# Patient Record
Sex: Male | Born: 1950 | Race: Black or African American | Hispanic: No | Marital: Married | Smoking: Former smoker
Health system: Southern US, Community
[De-identification: ages and names within clinical notes are randomized; demographics above are authoritative.]

## PROBLEM LIST (undated history)

## (undated) DIAGNOSIS — I10 Essential (primary) hypertension: Secondary | ICD-10-CM

## (undated) DIAGNOSIS — L83 Acanthosis nigricans: Secondary | ICD-10-CM

## (undated) DIAGNOSIS — I517 Cardiomegaly: Secondary | ICD-10-CM

## (undated) DIAGNOSIS — M25569 Pain in unspecified knee: Secondary | ICD-10-CM

## (undated) DIAGNOSIS — I219 Acute myocardial infarction, unspecified: Secondary | ICD-10-CM

## (undated) DIAGNOSIS — E785 Hyperlipidemia, unspecified: Secondary | ICD-10-CM

## (undated) DIAGNOSIS — Z86010 Personal history of colonic polyps: Secondary | ICD-10-CM

## (undated) DIAGNOSIS — I2510 Atherosclerotic heart disease of native coronary artery without angina pectoris: Secondary | ICD-10-CM

## (undated) DIAGNOSIS — I639 Cerebral infarction, unspecified: Secondary | ICD-10-CM

## (undated) DIAGNOSIS — E119 Type 2 diabetes mellitus without complications: Secondary | ICD-10-CM

## (undated) DIAGNOSIS — Z860101 Personal history of adenomatous and serrated colon polyps: Secondary | ICD-10-CM

## (undated) HISTORY — DX: Hyperlipidemia, unspecified: E78.5

## (undated) HISTORY — DX: Acanthosis nigricans: L83

## (undated) HISTORY — DX: Acute myocardial infarction, unspecified: I21.9

## (undated) HISTORY — DX: Type 2 diabetes mellitus without complications: E11.9

## (undated) HISTORY — DX: Personal history of adenomatous and serrated colon polyps: Z86.0101

## (undated) HISTORY — DX: Pain in unspecified knee: M25.569

## (undated) HISTORY — DX: Atherosclerotic heart disease of native coronary artery without angina pectoris: I25.10

## (undated) HISTORY — DX: Cardiomegaly: I51.7

## (undated) HISTORY — DX: Personal history of colonic polyps: Z86.010

## (undated) HISTORY — PX: CARDIAC CATHETERIZATION: SHX172

## (undated) HISTORY — PX: CYSTECTOMY: SUR359

## (undated) HISTORY — DX: Essential (primary) hypertension: I10

---
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
## 2006-09-18 DIAGNOSIS — E1129 Type 2 diabetes mellitus with other diabetic kidney complication: Secondary | ICD-10-CM | POA: Insufficient documentation

## 2006-09-18 DIAGNOSIS — I2510 Atherosclerotic heart disease of native coronary artery without angina pectoris: Secondary | ICD-10-CM | POA: Insufficient documentation

## 2006-09-18 DIAGNOSIS — I252 Old myocardial infarction: Secondary | ICD-10-CM | POA: Insufficient documentation

## 2006-09-18 DIAGNOSIS — E1121 Type 2 diabetes mellitus with diabetic nephropathy: Secondary | ICD-10-CM | POA: Insufficient documentation

## 2006-10-18 DIAGNOSIS — I517 Cardiomegaly: Secondary | ICD-10-CM | POA: Insufficient documentation

## 2007-01-24 ENCOUNTER — Ambulatory Visit: Payer: Self-pay | Admitting: Family Medicine

## 2007-02-08 DIAGNOSIS — Z87891 Personal history of nicotine dependence: Secondary | ICD-10-CM | POA: Insufficient documentation

## 2007-03-13 DIAGNOSIS — E7800 Pure hypercholesterolemia, unspecified: Secondary | ICD-10-CM | POA: Insufficient documentation

## 2007-03-13 DIAGNOSIS — I10 Essential (primary) hypertension: Secondary | ICD-10-CM | POA: Insufficient documentation

## 2007-03-14 DIAGNOSIS — N529 Male erectile dysfunction, unspecified: Secondary | ICD-10-CM | POA: Insufficient documentation

## 2009-06-13 DIAGNOSIS — R946 Abnormal results of thyroid function studies: Secondary | ICD-10-CM | POA: Insufficient documentation

## 2011-01-12 DIAGNOSIS — E782 Mixed hyperlipidemia: Secondary | ICD-10-CM | POA: Insufficient documentation

## 2013-03-29 ENCOUNTER — Ambulatory Visit: Payer: Self-pay | Admitting: Unknown Physician Specialty

## 2013-03-29 LAB — HM COLONOSCOPY

## 2013-03-30 LAB — PATHOLOGY REPORT

## 2013-05-04 LAB — PSA: PSA: 0.7

## 2013-05-04 LAB — HEPATIC FUNCTION PANEL
ALT: 19 U/L (ref 10–40)
AST: 17 U/L (ref 14–40)

## 2013-11-02 LAB — HEMOGLOBIN A1C: Hgb A1c MFr Bld: 6.7 % — AB (ref 4.0–6.0)

## 2013-11-05 LAB — LIPID PANEL
Cholesterol: 118 mg/dL (ref 0–200)
HDL: 29 mg/dL — AB (ref 35–70)
LDL Cholesterol: 67 mg/dL
Triglycerides: 111 mg/dL (ref 40–160)

## 2013-11-05 LAB — BASIC METABOLIC PANEL
BUN: 22 mg/dL — AB (ref 4–21)
Creatinine: 1.3 mg/dL (ref 0.6–1.3)
Glucose: 235 mg/dL
Potassium: 4.8 mmol/L (ref 3.4–5.3)
Sodium: 137 mmol/L (ref 137–147)

## 2014-03-28 LAB — HEMOGLOBIN A1C: Hgb A1c MFr Bld: 7.2 % — AB (ref 4.0–6.0)

## 2014-05-03 DIAGNOSIS — E780 Pure hypercholesterolemia: Secondary | ICD-10-CM | POA: Diagnosis not present

## 2014-05-03 DIAGNOSIS — I10 Essential (primary) hypertension: Secondary | ICD-10-CM | POA: Diagnosis not present

## 2014-05-03 DIAGNOSIS — I2510 Atherosclerotic heart disease of native coronary artery without angina pectoris: Secondary | ICD-10-CM | POA: Diagnosis not present

## 2014-06-17 DIAGNOSIS — L309 Dermatitis, unspecified: Secondary | ICD-10-CM | POA: Diagnosis not present

## 2014-09-30 ENCOUNTER — Other Ambulatory Visit: Payer: Self-pay | Admitting: Family Medicine

## 2014-09-30 NOTE — Telephone Encounter (Signed)
Patient is requesting a refill on his diabetic medicine. [facility] mail order. Cb# [phone]

## 2014-10-03 MED ORDER — SITAGLIPTIN PHOS-METFORMIN HCL 50-1000 MG PO TABS: 1.0000 | ORAL_TABLET | Freq: Two times a day (BID) | ORAL | Status: DC

## 2014-10-03 NOTE — Telephone Encounter (Signed)
Refill request for Janumet 50-1000 mg Last filled by MD on- 01/01/2014 #180 x3 Last Appt: 03/28/2014 Next Appt: none Please advise refill?

## 2014-11-27 DIAGNOSIS — Z87891 Personal history of nicotine dependence: Secondary | ICD-10-CM | POA: Diagnosis not present

## 2014-11-27 DIAGNOSIS — I10 Essential (primary) hypertension: Secondary | ICD-10-CM | POA: Diagnosis not present

## 2014-11-27 DIAGNOSIS — E782 Mixed hyperlipidemia: Secondary | ICD-10-CM | POA: Diagnosis not present

## 2014-11-27 DIAGNOSIS — I2510 Atherosclerotic heart disease of native coronary artery without angina pectoris: Secondary | ICD-10-CM | POA: Diagnosis not present

## 2014-12-05 ENCOUNTER — Other Ambulatory Visit: Payer: Self-pay | Admitting: Family Medicine

## 2014-12-31 ENCOUNTER — Other Ambulatory Visit: Payer: Self-pay | Admitting: Family Medicine

## 2015-01-06 DIAGNOSIS — Z87891 Personal history of nicotine dependence: Secondary | ICD-10-CM | POA: Diagnosis not present

## 2015-01-28 ENCOUNTER — Other Ambulatory Visit: Payer: Self-pay | Admitting: *Deleted

## 2015-01-28 ENCOUNTER — Telehealth: Payer: Self-pay

## 2015-01-28 MED ORDER — METOPROLOL SUCCINATE ER 200 MG PO TB24: 200.0000 mg | ORAL_TABLET | Freq: Every day | ORAL | Status: DC

## 2015-01-28 MED ORDER — GLIPIZIDE ER 10 MG PO TB24
10.0000 mg | ORAL_TABLET | Freq: Every day | ORAL | Status: DC
Start: 2015-01-28 — End: 2015-02-19

## 2015-01-28 MED ORDER — LISINOPRIL 40 MG PO TABS: 40.0000 mg | ORAL_TABLET | Freq: Every day | ORAL | Status: DC

## 2015-01-28 NOTE — Telephone Encounter (Signed)
Advised pt as directed. Pt verbalized fully understanding. Scheduled pt for a OV appointment on 02/07/2015 at 01:45 pm.  Thanks,

## 2015-01-28 NOTE — Telephone Encounter (Signed)
-----   Message from [name], MD sent at 01/28/2015  4:14 PM EDT ----- Regarding: needs office visit.  Please advise patient we sent refill to his mail order but he is due for office visit which he needs to schedule before additional refills can be approved.

## 2015-02-06 ENCOUNTER — Encounter: Payer: Self-pay | Admitting: *Deleted

## 2015-02-06 DIAGNOSIS — Z86010 Personal history of colonic polyps: Secondary | ICD-10-CM | POA: Insufficient documentation

## 2015-02-06 DIAGNOSIS — M25569 Pain in unspecified knee: Secondary | ICD-10-CM | POA: Insufficient documentation

## 2015-02-06 DIAGNOSIS — L83 Acanthosis nigricans: Secondary | ICD-10-CM | POA: Insufficient documentation

## 2015-02-06 DIAGNOSIS — M79673 Pain in unspecified foot: Secondary | ICD-10-CM | POA: Insufficient documentation

## 2015-02-06 DIAGNOSIS — Z860101 Personal history of adenomatous and serrated colon polyps: Secondary | ICD-10-CM | POA: Insufficient documentation

## 2015-02-07 ENCOUNTER — Encounter: Payer: Self-pay | Admitting: Family Medicine

## 2015-02-07 ENCOUNTER — Ambulatory Visit (INDEPENDENT_AMBULATORY_CARE_PROVIDER_SITE_OTHER): Payer: Medicare Other | Admitting: Family Medicine

## 2015-02-07 VITALS — BP 138/74 | HR 68 | Temp 98.2°F | Resp 16 | Ht 66.5 in | Wt 270.0 lb

## 2015-02-07 DIAGNOSIS — Z23 Encounter for immunization: Secondary | ICD-10-CM

## 2015-02-07 DIAGNOSIS — I10 Essential (primary) hypertension: Secondary | ICD-10-CM

## 2015-02-07 DIAGNOSIS — E119 Type 2 diabetes mellitus without complications: Secondary | ICD-10-CM | POA: Diagnosis not present

## 2015-02-07 DIAGNOSIS — E7800 Pure hypercholesterolemia, unspecified: Secondary | ICD-10-CM | POA: Diagnosis not present

## 2015-02-07 DIAGNOSIS — I2510 Atherosclerotic heart disease of native coronary artery without angina pectoris: Secondary | ICD-10-CM

## 2015-02-07 DIAGNOSIS — R946 Abnormal results of thyroid function studies: Secondary | ICD-10-CM | POA: Diagnosis not present

## 2015-02-07 LAB — POCT GLYCOSYLATED HEMOGLOBIN (HGB A1C)
Est. average glucose Bld gHb Est-mCnc: 197
Hemoglobin A1C: 8.5

## 2015-02-07 LAB — POCT UA - MICROALBUMIN: Microalbumin Ur, POC: 20 mg/L

## 2015-02-07 NOTE — Progress Notes (Signed)
Patient: Todd Potter Male    DOB: [birth year]   64 y.o.   MRN: [id number] Visit Date: 02/07/2015  Today's Provider: [name], MD   Chief Complaint  Patient presents with  . Hypertension    follow up  . Diabetes    follow up  . Hyperlipidemia    follow up   Subjective:    HPI  Diabetes Mellitus Type II, Follow-up:   Lab Results  Component Value Date   HGBA1C 8.5 02/07/2015   HGBA1C 7.2* 03/28/2014   HGBA1C 6.7* 11/02/2013   Last seen for diabetes 1 years ago.  Management since then includes advising patient to get strict with his diet. He reports good compliance with treatment. He is not having side effects.  Current symptoms include none and have been stable. Home blood sugar records: not being checked   Episodes of hypoglycemia? no   Current Insulin Regimen: none Most Recent Eye Exam:  2 years ago  Weight trend: increasing Prior visit with dietician: no Current diet: in general, an "unhealthy" diet Current exercise: none  ------------------------------------------------------------------------   Hypertension, follow-up:  BP Readings from Last 3 Encounters:  02/07/15 138/74  03/28/14 148/76    He was last seen for hypertension 1 years ago.  BP at that visit was 148/76. Management since that visit includes advising patient to get strict with his diet and decrease sodium.Marland KitchenHe reports good compliance with treatment. He is not having side effects.  He is not exercising. He is adherent to low salt diet.   Outside blood pressures are 027-253 systolic over 66'Y diastolic. He is experiencing none.  Patient denies chest pain, chest pressure/discomfort, claudication, dyspnea, exertional chest pressure/discomfort, fatigue, irregular heart beat, lower extremity edema, near-syncope, orthopnea, palpitations, paroxysmal nocturnal dyspnea, syncope and tachypnea.   Cardiovascular risk factors include advanced age (older than 73 for men, 57 for women),  diabetes mellitus, hypertension, male gender and sedentary lifestyle.  Use of agents associated with hypertension: NSAIDS.   ------------------------------------------------------------------------    Lipid/Cholesterol, Follow-up:   Last seen for this 1 years ago.  Management since that visit includes no changes.  Last Lipid Panel:    Component Value Date/Time   CHOL 118 11/05/2013   TRIG 111 11/05/2013   HDL 29* 11/05/2013   LDLCALC 67 11/05/2013    He reports fair compliance with treatment. Current treatment includes  Atorvastatin 80mg  daily. Patient believes he was supposed to have been on 40mg  daily and has only been taking 1/2 tablet of the Atorvastatin 80mg .  He is not having side effects.   Wt Readings from Last 3 Encounters:  02/07/15 270 lb (122.471 kg)  03/28/14 263 lb (119.296 kg)    ------------------------------------------------------------------------      Allergies no known allergies Previous Medications   AMLODIPINE (NORVASC) 10 MG TABLET    Take 1 tablet by mouth daily.   ASPIRIN 81 MG TABLET    Take 1 tablet by mouth daily.   ATORVASTATIN (LIPITOR) 80 MG TABLET    Take 0.5 tablets by mouth daily.    GLIPIZIDE (GLUCOTROL XL) 10 MG 24 HR TABLET    Take 1 tablet (10 mg total) by mouth daily.   GLUCOSE BLOOD (TRUETEST TEST) TEST STRIP    TRUETEST TEST (In Vitro Strip)  Check blood sugar daily for 0 days  Quantity: 100.00;  Refills: 12   Ordered :01-Mar-2011  [name] MD;  Started 25-September-2007 Active Comments: fishdona (09/25/2007) - With 100 lancets   HYDROCHLOROTHIAZIDE (HYDRODIURIL)  25 MG TABLET    TAKE 1 TABLET BY MOUTH EVERY DAY   LISINOPRIL (PRINIVIL,ZESTRIL) 40 MG TABLET    Take 1 tablet (40 mg total) by mouth daily.   METOPROLOL (TOPROL-XL) 200 MG 24 HR TABLET    Take 1 tablet (200 mg total) by mouth daily.   NITROGLYCERIN (NITROSTAT) 0.4 MG SL TABLET    Place 1 tablet under the tongue. 3-5 minutes x3 as nedded   SILDENAFIL (VIAGRA) 100 MG  TABLET    Take by mouth. 1/2-1 tablet as needed, no more than 1 a day   SITAGLIPTIN-METFORMIN (JANUMET) 50-1000 MG PER TABLET    Take 1 tablet by mouth 2 (two) times daily with a meal.    Review of Systems  Constitutional: Negative for fever, chills and appetite change.  Respiratory: Negative for chest tightness, shortness of breath and wheezing.   Cardiovascular: Negative for chest pain and palpitations.  Gastrointestinal: Negative for nausea, vomiting and abdominal pain.  Endocrine: Negative for cold intolerance, heat intolerance, polydipsia, polyphagia and polyuria.    Social History  Substance Use Topics  . Smoking status: Former Smoker    Quit date: 04/19/2006  . Smokeless tobacco: Not on file  . Alcohol Use: 0.0 oz/week    0 Standard drinks or equivalent per week     Comment: drinks 1 beer monthly   Objective:   BP 138/74 mmHg  Pulse 68  Temp(Src) 98.2 F (36.8 C) (Oral)  Resp 16  Ht 5' 6.5" (1.689 m)  Wt 270 lb (122.471 kg)  BMI 42.93 kg/m2  SpO2 96%  Physical Exam  General Appearance:    Alert, cooperative, no distress, obese  Eyes:    PERRL, conjunctiva/corneas clear, EOM's intact       Lungs:     Clear to auscultation bilaterally, respirations unlabored  Heart:    Regular rate and rhythm  Neurologic:   Awake, alert, oriented x 3. No apparent focal neurological           defect.       Results for orders placed or performed in visit on 02/07/15  POCT HgB A1C  Result Value Ref Range   Hemoglobin A1C 8.5    Est. average glucose Bld gHb Est-mCnc 197   POCT UA - Microalbumin  Result Value Ref Range   Microalbumin Ur, POC 20 mg/L   Creatinine, POC n/a mg/dL   Albumin/Creatinine Ratio, Urine, POC n/a        Assessment & Plan:     1. Controlled type 2 diabetes mellitus without complication, without long-term current use of insulin (HCC) Worsening. He has not been following diet well and is gaining weight. He is to get on sctrict diabetic diet and increase  exercise to lose weight. Advised we will need to add additional medications if not improved at follow up.  - POCT HgB A1C - POCT UA - Microalbumin  2. Abnormal results on thyroid function studies  - TSH  3. Coronary artery disease involving native coronary artery of native heart without angina pectoris Asymptomatic. Compliant with medication.  Continue aggressive risk factor modification.  Continue routine follow up with  Cardiologist.   4. Essential hypertension Stable BP. Continue current medications.   - Renal function panel  5. Pure hypercholesterolemia Is currently only taking 1/2 of an 80mg  atorvastatin. Will see how lipids look to see how effective this dose is - Lipid panel  6. Morbid obesity, unspecified obesity type ([place]) D&E  7. Need for pneumococcal vaccination -[id number]  administered today.   Return in about 3 months (around 05/10/2015).       [name], MD  [facility]

## 2015-02-10 ENCOUNTER — Other Ambulatory Visit: Payer: Self-pay | Admitting: Family Medicine

## 2015-02-10 DIAGNOSIS — I10 Essential (primary) hypertension: Secondary | ICD-10-CM | POA: Diagnosis not present

## 2015-02-10 DIAGNOSIS — R946 Abnormal results of thyroid function studies: Secondary | ICD-10-CM | POA: Diagnosis not present

## 2015-02-10 DIAGNOSIS — E7800 Pure hypercholesterolemia, unspecified: Secondary | ICD-10-CM | POA: Diagnosis not present

## 2015-02-11 ENCOUNTER — Other Ambulatory Visit: Payer: Self-pay | Admitting: Family Medicine

## 2015-02-11 LAB — LIPID PANEL
Chol/HDL Ratio: 4.7 ratio units (ref 0.0–5.0)
Cholesterol, Total: 149 mg/dL (ref 100–199)
HDL: 32 mg/dL — ABNORMAL LOW (ref >39)
LDL Calculated: 99 mg/dL (ref 0–99)
Triglycerides: 89 mg/dL (ref 0–149)
VLDL Cholesterol Cal: 18 mg/dL (ref 5–40)

## 2015-02-11 LAB — RENAL FUNCTION PANEL
Albumin: 4 g/dL (ref 3.6–4.8)
BUN/Creatinine Ratio: 15 (ref 10–22)
BUN: 20 mg/dL (ref 8–27)
CO2: 23 mmol/L (ref 18–29)
Calcium: 9.6 mg/dL (ref 8.6–10.2)
Chloride: 95 mmol/L — ABNORMAL LOW (ref 97–106)
Creatinine, Ser: 1.31 mg/dL — ABNORMAL HIGH (ref 0.76–1.27)
GFR calc Af Amer: 66 mL/min/{1.73_m2} (ref >59)
GFR calc non Af Amer: 57 mL/min/{1.73_m2} — ABNORMAL LOW (ref >59)
Glucose: 195 mg/dL — ABNORMAL HIGH (ref 65–99)
Phosphorus: 3.6 mg/dL (ref 2.5–4.5)
Potassium: 4.4 mmol/L (ref 3.5–5.2)
Sodium: 139 mmol/L (ref 136–144)

## 2015-02-11 LAB — TSH: TSH: 0.467 u[IU]/mL (ref 0.450–4.500)

## 2015-02-11 MED ORDER — ATORVASTATIN CALCIUM 80 MG PO TABS: 80.0000 mg | ORAL_TABLET | Freq: Every day | ORAL | Status: DC

## 2015-02-13 ENCOUNTER — Telehealth: Payer: Self-pay | Admitting: Family Medicine

## 2015-02-13 NOTE — Telephone Encounter (Signed)
Pt returning call.  CB#[phone]/MW

## 2015-02-13 NOTE — Telephone Encounter (Signed)
Tried calling patient to advised of lab results. No answer.

## 2015-02-19 ENCOUNTER — Other Ambulatory Visit: Payer: Self-pay | Admitting: *Deleted

## 2015-02-19 MED ORDER — METOPROLOL SUCCINATE ER 200 MG PO TB24: 200.0000 mg | ORAL_TABLET | Freq: Every day | ORAL | Status: DC

## 2015-02-19 MED ORDER — SITAGLIPTIN PHOS-METFORMIN HCL 50-1000 MG PO TABS: 1.0000 | ORAL_TABLET | Freq: Two times a day (BID) | ORAL | Status: DC

## 2015-02-19 MED ORDER — GLIPIZIDE ER 10 MG PO TB24: 10.0000 mg | ORAL_TABLET | Freq: Every day | ORAL | Status: DC

## 2015-02-19 MED ORDER — AMLODIPINE BESYLATE 10 MG PO TABS: 10.0000 mg | ORAL_TABLET | Freq: Every day | ORAL | Status: DC

## 2015-02-19 MED ORDER — LISINOPRIL 40 MG PO TABS: 40.0000 mg | ORAL_TABLET | Freq: Every day | ORAL | Status: DC

## 2015-02-19 MED ORDER — ATORVASTATIN CALCIUM 80 MG PO TABS: 80.0000 mg | ORAL_TABLET | Freq: Every day | ORAL | Status: DC

## 2015-02-19 NOTE — Telephone Encounter (Signed)
Patient was notified of results. Patient expressed understanding. 

## 2015-03-13 ENCOUNTER — Other Ambulatory Visit: Payer: Self-pay | Admitting: Family Medicine

## 2015-03-18 ENCOUNTER — Other Ambulatory Visit: Payer: Self-pay | Admitting: Family Medicine

## 2015-05-16 DIAGNOSIS — E782 Mixed hyperlipidemia: Secondary | ICD-10-CM | POA: Diagnosis not present

## 2015-05-16 DIAGNOSIS — I2510 Atherosclerotic heart disease of native coronary artery without angina pectoris: Secondary | ICD-10-CM | POA: Diagnosis not present

## 2015-05-16 DIAGNOSIS — I10 Essential (primary) hypertension: Secondary | ICD-10-CM | POA: Diagnosis not present

## 2015-06-26 ENCOUNTER — Encounter: Payer: Self-pay | Admitting: Family Medicine

## 2015-06-26 ENCOUNTER — Ambulatory Visit (INDEPENDENT_AMBULATORY_CARE_PROVIDER_SITE_OTHER): Payer: Medicare Other | Admitting: Family Medicine

## 2015-06-26 VITALS — BP 130/80 | HR 69 | Temp 98.7°F | Resp 16 | Ht 66.5 in | Wt 267.0 lb

## 2015-06-26 DIAGNOSIS — E1121 Type 2 diabetes mellitus with diabetic nephropathy: Secondary | ICD-10-CM

## 2015-06-26 DIAGNOSIS — E1129 Type 2 diabetes mellitus with other diabetic kidney complication: Secondary | ICD-10-CM

## 2015-06-26 DIAGNOSIS — E7800 Pure hypercholesterolemia, unspecified: Secondary | ICD-10-CM

## 2015-06-26 DIAGNOSIS — E1165 Type 2 diabetes mellitus with hyperglycemia: Secondary | ICD-10-CM | POA: Diagnosis not present

## 2015-06-26 DIAGNOSIS — Z125 Encounter for screening for malignant neoplasm of prostate: Secondary | ICD-10-CM | POA: Diagnosis not present

## 2015-06-26 DIAGNOSIS — I10 Essential (primary) hypertension: Secondary | ICD-10-CM | POA: Diagnosis not present

## 2015-06-26 DIAGNOSIS — I2510 Atherosclerotic heart disease of native coronary artery without angina pectoris: Secondary | ICD-10-CM

## 2015-06-26 DIAGNOSIS — Z86010 Personal history of colonic polyps: Secondary | ICD-10-CM | POA: Diagnosis not present

## 2015-06-26 DIAGNOSIS — Z0000 Encounter for general adult medical examination without abnormal findings: Secondary | ICD-10-CM | POA: Diagnosis not present

## 2015-06-26 DIAGNOSIS — IMO0002 Reserved for concepts with insufficient information to code with codable children: Secondary | ICD-10-CM

## 2015-06-26 LAB — POCT GLYCOSYLATED HEMOGLOBIN (HGB A1C)
Est. average glucose Bld gHb Est-mCnc: 209
Hemoglobin A1C: 8.9

## 2015-06-26 NOTE — Progress Notes (Signed)
Patient: Todd Potter, Male    DOB: Nov 10, 1950, 65 y.o.   MRN: [id number] Visit Date: 06/26/2015  Today's Provider: [name], MD   Chief Complaint  Patient presents with  . Medicare Wellness  . Hypertension  . Diabetes  . Hyperlipidemia   Subjective:    Annual wellness visit Todd Potter is a 65 y.o. male. He feels well. He reports exercising none. He reports he is sleeping well.  -----------------------------------------------------------  Follow-up for CAD from 02/07/2015; followed by cardiology.    Diabetes Mellitus Type II, Follow-up:   Lab Results  Component Value Date   HGBA1C 8.5 02/07/2015   HGBA1C 7.2* 03/28/2014   HGBA1C 6.7* 11/02/2013   Last seen for diabetes 5 months ago.  Management since then includes; advised to get back on strict diet and increase exercise to lose weight. He reports good compliance with treatment. He is not having side effects. none Current symptoms include none and have been stable. Home blood sugar records: fasting range: n/a  Episodes of hypoglycemia? no   Current Insulin Regimen: n/a Most Recent Eye Exam: due Weight trend: stable Prior visit with dietician: no Current diet: in general, an "unhealthy" diet Current exercise: none  ----------------------------------------------------------------------   Hypertension, follow-up:  BP Readings from Last 3 Encounters:  06/26/15 130/80  02/07/15 138/74  03/28/14 148/76    He was last seen for hypertension 5 months ago.  BP at that visit was 138/74. Management since that visit includes; no changes.He reports good compliance with treatment. He is not having side effects. nnoe  He is not exercising. He is not adherent to low salt diet.   Outside blood pressures are 130/80. He is experiencing none.  Patient denies none.   Cardiovascular risk factors include diabetes mellitus.  Use of agents associated with hypertension: none.    ----------------------------------------------------------------------    Lipid/Cholesterol, Follow-up:   Last seen for this 5 months ago.  Management since that visit includes; increased atorvastatin to 80 mg qd.  Last Lipid Panel:    Component Value Date/Time   CHOL 149 02/10/2015 0856   CHOL 118 11/05/2013   TRIG 89 02/10/2015 0856   HDL 32* 02/10/2015 0856   HDL 29* 11/05/2013   CHOLHDL 4.7 02/10/2015 0856   LDLCALC 99 02/10/2015 0856   LDLCALC 67 11/05/2013    He reports good compliance with treatment. He is not having side effects. none  Wt Readings from Last 3 Encounters:  06/26/15 267 lb (121.11 kg)  02/07/15 270 lb (122.471 kg)  03/28/14 263 lb (119.296 kg)    ----------------------------------------------------------------------        Review of Systems  Constitutional: Negative.   HENT: Negative.   Eyes: Negative.   Respiratory: Negative.   Cardiovascular: Negative.   Gastrointestinal: Negative.   Endocrine: Negative.   Genitourinary: Positive for frequency.  Musculoskeletal: Negative.   Skin: Positive for rash.  Allergic/Immunologic: Negative.   Neurological: Negative.   Hematological: Negative.   Psychiatric/Behavioral: Negative.     Social History   Social History  . Marital Status: Married    Spouse Name: N/A  . Number of Children: 2  . Years of Education: HS Grad   Occupational History  . Part-Time     [employer]   Social History Main Topics  . Smoking status: Former Smoker    Quit date: 04/19/2006  . Smokeless tobacco: Not on file  . Alcohol Use: 0.0 oz/week    0 Standard drinks or equivalent per  week     Comment: drinks 1 beer monthly  . Drug Use: No  . Sexual Activity: Not on file   Other Topics Concern  . Not on file   Social History Narrative    Past Medical History  Diagnosis Date  . Diabetes mellitus without complication ([place])   . Hyperlipidemia   . Hypertension   . CAD (coronary artery  disease)   . Myocardial infarct ([place])   . Cardiomegaly   . Acanthosis nigricans   . Knee pain   . History of adenomatous polyp of colon      Patient Active Problem List   Diagnosis Date Noted  . Morbid obesity ([place]) 02/07/2015  . Knee pain 02/06/2015  . History of adenomatous polyp of colon 02/06/2015  . Acanthosis nigricans 02/06/2015  . Abnormal results on thyroid function studies 06/13/2009  . 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 Pure hypercholesterolemia 03/13/2007  . Essential hypertension 03/13/2007  . Personal history of tobacco use 02/08/2007  . Cardiac enlargement 10/18/2006  . Myocardial infarct, old 09/18/2006  . Controlled diabetes mellitus with renal manifestations ([place]) 09/18/2006  . CAD (coronary artery disease) 09/18/2006    Past Surgical History  Procedure Laterality Date  . Cystectomy  1970&1990    x2 on tail bone    His family history is not on file.    Previous Medications   AMLODIPINE (NORVASC) 10 MG TABLET    Take 1 tablet (10 mg total) by mouth daily.   ASPIRIN 81 MG TABLET    Take 1 tablet by mouth daily.   ATORVASTATIN (LIPITOR) 80 MG TABLET    TAKE 1 TABLET DAILY   GLIPIZIDE (GLUCOTROL XL) 10 MG 24 HR TABLET    Take 1 tablet (10 mg total) by mouth daily.   GLUCOSE BLOOD (TRUETEST TEST) TEST STRIP    TRUETEST TEST (In Vitro Strip)  Check blood sugar daily for 0 days  Quantity: 100.00;  Refills: 12   Ordered :01-Mar-2011  [name] MD;  Started 25-September-2007 Active Comments: fishdona (09/25/2007) - With 100 lancets   HYDROCHLOROTHIAZIDE (HYDRODIURIL) 25 MG TABLET    Take 1 tablet (25 mg total) by mouth daily.   JANUMET 50-1000 MG TABLET    TAKE 1 TABLET BY MOUTH TWICE A DAY   LISINOPRIL (PRINIVIL,ZESTRIL) 40 MG TABLET    Take 1 tablet (40 mg total) by mouth daily.   LOSARTAN (COZAAR) 50 MG TABLET    Take 50 mg by mouth daily.   METOPROLOL (TOPROL-XL) 200 MG 24 HR TABLET    Take 1 tablet (200 mg total) by mouth daily.   NITROGLYCERIN  (NITROSTAT) 0.4 MG SL TABLET    Place 1 tablet under the tongue. 3-5 minutes x3 as nedded   SILDENAFIL (VIAGRA) 100 MG TABLET    Take by mouth. 1/2-1 tablet as needed, no more than 1 a day    Patient Care Team: [name], MD as PCP - General (Family Medicine) Shirley Friar, MD (Cardiology)     Objective:   Vitals: BP 130/80 mmHg  Pulse 69  Temp(Src) 98.7 F (37.1 C) (Oral)  Resp 16  Ht 5' 6.5" (1.689 m)  Wt 267 lb (121.11 kg)  BMI 42.45 kg/m2  SpO2 95%  Physical Exam  General Appearance:    Alert, cooperative, no distress, obese  Eyes:    PERRL, conjunctiva/corneas clear, EOM's intact       Lungs:     Clear to auscultation bilaterally, respirations unlabored  Heart:  Regular rate and rhythm  Neurologic:   Awake, alert, oriented x 3. No apparent focal neurological           defect.        Activities of Daily Living In your present state of health, do you have any difficulty performing the following activities: 06/26/2015  Hearing? N  Vision? N  Difficulty concentrating or making decisions? N  Walking or climbing stairs? N  Dressing or bathing? N  Doing errands, shopping? N    Fall Risk Assessment Fall Risk  06/26/2015 02/07/2015  Falls in the past year? No No     Depression Screen PHQ 2/9 Scores 06/26/2015 02/07/2015  PHQ - 2 Score 0 0  PHQ- 9 Score - 0    Cognitive Testing - 6-CIT  Correct? Score   What year is it? yes 0 0 or 4  What month is it? yes 0 0 or 3  Memorize:    Pia Mau,  41,  [place],      What time is it? (within 1 hour) yes 0 0 or 3  Count backwards from 20 yes 0 0, 2, or 4  Name the months of the year yes 0 0, 2, or 4  Repeat name & address above yes 6 0, 2, 4, 6, 8, or 10       TOTAL SCORE  6/28   Interpretation:  Normal  Normal (0-7) Abnormal (8-28)    Audit-C Alcohol Use Screening  Question Answer Points  How often do you have alcoholic drink? 1 times monthly 1  On days you do drink alcohol, how many drinks do  you typically consume? 1 or 2 0  How oftey will you drink 6 or more in a total? never 0  Total Score:  1   A score of 3 or more in women, and 4 or more in men indicates increased risk for alcohol abuse, EXCEPT if all of the points are from question 1.     Assessment & Plan:     Annual Wellness Visit  Reviewed patient's Family Medical History Reviewed and updated list of patient's medical providers Assessment of cognitive impairment was done Assessed patient's functional ability Established a written schedule for health screening [place] Completed and Reviewed  Exercise Activities and Dietary recommendations Goals    None      Immunization History  Administered Date(s) Administered  . Pneumococcal Conjugate-13 02/07/2015  . Pneumococcal Polysaccharide-23 04/19/2010  . Td 10/25/1997  . Tdap 03/10/2012    Health Maintenance  Topic Date Due  . FOOT EXAM  05/08/1959  . OPHTHALMOLOGY EXAM  05/08/1959  . ZOSTAVAX  05/07/2009  . INFLUENZA VACCINE  07/19/2015 (Originally 11/18/2014)  . HEMOGLOBIN A1C  08/08/2015  . PNA vac Low Risk Adult (2 of 2 - PPSV23) 02/07/2016  . TETANUS/TDAP  03/10/2022  . COLONOSCOPY  03/30/2023  . Hepatitis C Screening  Completed      Discussed health benefits of physical activity, and encouraged him to engage in regular exercise appropriate for his age and condition.    --------------------------------------------------------------------------------  1. Medicare annual wellness visit, subsequent   2. Uncontrolled type 2 diabetes mellitus with diabetic nephropathy, without long-term current use of insulin ([place]) Counseled to start checking fastings every day and lose weight through diet and exercising 150 minutes per week.   Return in 2 months, advised we will need add another medication if not improved at follow up.  - POCT glycosylated hemoglobin (  Hb A1C) - EKG 12-Lead  3. Coronary artery disease involving native  coronary artery of native heart without angina pectoris Asymptomatic. Compliant with medication.  Continue aggressive risk factor modification.  Continue routine follow up with Dr. Charlane Ferretti  4 Essential hypertension Improved since cardiologist added losartan. Consider discontinuation if we add another diabetic agent such as an SGLt-2 Inhibitor - EKG 12-Lead - Renal function panel  5. History of adenomatous polyp of colon Is up to date on colonoscopies  6. Morbid obesity, unspecified obesity type ([place]) Diet and exercise  7. Pure hypercholesterolemia Tolerating increased dose of atorvastatin.  - Hepatic function panel - Lipid panel  8. 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

## 2015-06-27 LAB — HEPATIC FUNCTION PANEL
ALT: 24 IU/L (ref 0–44)
AST: 15 IU/L (ref 0–40)
Alkaline Phosphatase: 97 IU/L (ref 39–117)
Bilirubin Total: 0.6 mg/dL (ref 0.0–1.2)
Bilirubin, Direct: 0.13 mg/dL (ref 0.00–0.40)
Total Protein: 7.9 g/dL (ref 6.0–8.5)

## 2015-06-27 LAB — RENAL FUNCTION PANEL
Albumin: 4.1 g/dL (ref 3.6–4.8)
BUN/Creatinine Ratio: 16 (ref 10–22)
BUN: 21 mg/dL (ref 8–27)
CO2: 24 mmol/L (ref 18–29)
Calcium: 9.2 mg/dL (ref 8.6–10.2)
Chloride: 96 mmol/L (ref 96–106)
Creatinine, Ser: 1.29 mg/dL — ABNORMAL HIGH (ref 0.76–1.27)
GFR calc Af Amer: 66 mL/min/{1.73_m2} (ref >59)
GFR calc non Af Amer: 57 mL/min/{1.73_m2} — ABNORMAL LOW (ref >59)
Glucose: 183 mg/dL — ABNORMAL HIGH (ref 65–99)
Phosphorus: 3.1 mg/dL (ref 2.5–4.5)
Potassium: 4.4 mmol/L (ref 3.5–5.2)
Sodium: 135 mmol/L (ref 134–144)

## 2015-06-27 LAB — LIPID PANEL
Chol/HDL Ratio: 4.2 ratio units (ref 0.0–5.0)
Cholesterol, Total: 131 mg/dL (ref 100–199)
HDL: 31 mg/dL — ABNORMAL LOW (ref >39)
LDL Calculated: 84 mg/dL (ref 0–99)
Triglycerides: 81 mg/dL (ref 0–149)
VLDL Cholesterol Cal: 16 mg/dL (ref 5–40)

## 2015-06-27 LAB — PSA: Prostate Specific Ag, Serum: 0.8 ng/mL (ref 0.0–4.0)

## 2015-08-28 ENCOUNTER — Ambulatory Visit (INDEPENDENT_AMBULATORY_CARE_PROVIDER_SITE_OTHER): Payer: Medicare Other | Admitting: Family Medicine

## 2015-08-28 ENCOUNTER — Encounter: Payer: Self-pay | Admitting: Family Medicine

## 2015-08-28 VITALS — BP 140/72 | HR 65 | Temp 97.9°F | Resp 16 | Wt 266.0 lb

## 2015-08-28 DIAGNOSIS — IMO0001 Reserved for inherently not codable concepts without codable children: Secondary | ICD-10-CM

## 2015-08-28 DIAGNOSIS — E1165 Type 2 diabetes mellitus with hyperglycemia: Secondary | ICD-10-CM | POA: Diagnosis not present

## 2015-08-28 DIAGNOSIS — I2510 Atherosclerotic heart disease of native coronary artery without angina pectoris: Secondary | ICD-10-CM

## 2015-08-28 LAB — POCT GLYCOSYLATED HEMOGLOBIN (HGB A1C)
Est. average glucose Bld gHb Est-mCnc: 203
Hemoglobin A1C: 8.7

## 2015-08-28 MED ORDER — EMPAGLIFLOZIN 10 MG PO TABS: 10.0000 mg | ORAL_TABLET | Freq: Every day | ORAL | Status: DC

## 2015-08-28 NOTE — Patient Instructions (Signed)
   Start taking samples of Jardiance 10mg  tablet once a day   You need to have labs checked in 4 weeks to see how Todd Potter is working with your kidneys.

## 2015-08-28 NOTE — Progress Notes (Signed)
Patient: Todd Potter Male    DOB: 06/23/1950   65 y.o.   MRN: [id number] Visit Date: 08/28/2015  Today's Provider: [name], MD   Chief Complaint  Patient presents with  . Diabetes    follow up   Subjective:    HPI  Diabetes Mellitus Type II, Follow-up:   Lab Results  Component Value Date   HGBA1C 8.9 06/26/2015   HGBA1C 8.5 02/07/2015   HGBA1C 7.2* 03/28/2014    Last seen for diabetes 2 months ago.  Management since then includes advising patient to start checking fasting sugars every day, improving diet and increasing exercise to 150 minutes per week. He reports poor compliance with treatment. Patient has been taking his medications but has not been watching his diet, checking his fasting sugars or exercising regularly.  He is not having side effects.  Current symptoms include none and have been stable. Home blood sugar records: not being checked  Episodes of hypoglycemia? no   Current Insulin Regimen: none Most Recent Eye Exam:  07/2015 Weight trend: stable Prior visit with dietician: no Current diet: in general, an "unhealthy" diet Current exercise: none  Pertinent Labs:    Component Value Date/Time   CHOL 131 06/26/2015 1033   CHOL 118 11/05/2013   TRIG 81 06/26/2015 1033   HDL 31* 06/26/2015 1033   HDL 29* 11/05/2013   LDLCALC 84 06/26/2015 1033   LDLCALC 67 11/05/2013   CREATININE 1.29* 06/26/2015 1033   CREATININE 1.3 11/05/2013    Wt Readings from Last 3 Encounters:  08/28/15 266 lb (120.657 kg)  06/26/15 267 lb (121.11 kg)  02/07/15 270 lb (122.471 kg)    ------------------------------------------------------------------------      No Known Allergies Previous Medications   AMLODIPINE (NORVASC) 10 MG TABLET    Take 1 tablet (10 mg total) by mouth daily.   ASPIRIN 81 MG TABLET    Take 1 tablet by mouth daily.   ATORVASTATIN (LIPITOR) 80 MG TABLET    TAKE 1 TABLET DAILY   GLIPIZIDE (GLUCOTROL XL) 10 MG 24 HR TABLET    Take 1  tablet (10 mg total) by mouth daily.   GLUCOSE BLOOD (TRUETEST TEST) TEST STRIP    TRUETEST TEST (In Vitro Strip)  Check blood sugar daily for 0 days  Quantity: 100.00;  Refills: 12   Ordered :01-Mar-2011  [name] MD;  Started 25-September-2007 Active Comments: fishdona (09/25/2007) - With 100 lancets   HYDROCHLOROTHIAZIDE (HYDRODIURIL) 25 MG TABLET    Take 1 tablet (25 mg total) by mouth daily.   JANUMET 50-1000 MG TABLET    TAKE 1 TABLET BY MOUTH TWICE A DAY   LISINOPRIL (PRINIVIL,ZESTRIL) 40 MG TABLET    Take 1 tablet (40 mg total) by mouth daily.   LOSARTAN (COZAAR) 50 MG TABLET    Take 50 mg by mouth daily.   METOPROLOL (TOPROL-XL) 200 MG 24 HR TABLET    Take 1 tablet (200 mg total) by mouth daily.   NITROGLYCERIN (NITROSTAT) 0.4 MG SL TABLET    Place 1 tablet under the tongue. 3-5 minutes x3 as nedded   SILDENAFIL (VIAGRA) 100 MG TABLET    Take by mouth. 1/2-1 tablet as needed, no more than 1 a day    Review of Systems  Constitutional: Negative for fever, chills and appetite change.  Respiratory: Negative for chest tightness, shortness of breath and wheezing.   Cardiovascular: Negative for chest pain and palpitations.  Gastrointestinal: Negative for nausea, vomiting  and abdominal pain.    Social History  Substance Use Topics  . Smoking status: Former Smoker    Quit date: 04/19/2006  . Smokeless tobacco: Not on file  . Alcohol Use: 0.0 oz/week    0 Standard drinks or equivalent per week     Comment: drinks 1 beer monthly   Objective:   BP 140/72 mmHg  Pulse 65  Temp(Src) 97.9 F (36.6 C) (Oral)  Resp 16  Wt 266 lb (120.657 kg)  SpO2 96%  Physical Exam  General appearance: alert, well developed, well nourished, cooperative and in no distress Head: Normocephalic, without obvious abnormality, atraumatic Lungs: Respirations even and unlabored Extremities: No gross deformities Skin: Skin color, texture, turgor normal. No rashes seen  Psych: Appropriate mood and  affect. Neurologic: Mental status: Alert, oriented to person, place, and time, thought content appropriate.   Results for orders placed or performed in visit on 08/28/15  POCT HgB A1C  Result Value Ref Range   Hemoglobin A1C 8.7    Est. average glucose Bld gHb Est-mCnc 203         Assessment & Plan:     1. Uncontrolled type 2 diabetes mellitus without complication, without long-term current use of insulin (HCC)  - POCT HgB A1C - empagliflozin (JARDIANCE) 10 MG TABS tablet; Take 10 mg by mouth daily.  Dispense: 28 tablet; Refill: 0  Will add Jardiance 10mg . Check renal and BP in 4 weeks. Consider increase to 25mg  at that time.        [name], MD  [facility]

## 2015-09-25 ENCOUNTER — Other Ambulatory Visit: Payer: Self-pay | Admitting: Family Medicine

## 2015-09-29 ENCOUNTER — Telehealth: Payer: Self-pay | Admitting: Family Medicine

## 2015-09-29 DIAGNOSIS — IMO0001 Reserved for inherently not codable concepts without codable children: Secondary | ICD-10-CM

## 2015-09-29 DIAGNOSIS — E1165 Type 2 diabetes mellitus with hyperglycemia: Secondary | ICD-10-CM

## 2015-09-29 DIAGNOSIS — I10 Essential (primary) hypertension: Secondary | ICD-10-CM

## 2015-09-29 MED ORDER — EMPAGLIFLOZIN 10 MG PO TABS
10.0000 mg | ORAL_TABLET | Freq: Every day | ORAL | Status: DC
Start: 2015-09-29 — End: 2015-10-23

## 2015-09-29 NOTE — Telephone Encounter (Signed)
Please advise patient it is time to check Renal Panel since we started jardiance last month. Order is ready to pick up at front desk. Need to have CMA check his BP when he picks up lab order.   Thanks.

## 2015-09-29 NOTE — Telephone Encounter (Signed)
Patient was notified. Patient stated that he ran out of jardiance last week, so he hasn't taken the med since then. Patient request more samples?

## 2015-09-29 NOTE — Telephone Encounter (Signed)
Per Fisher, samples were given to pt. Patient is to wait 2 weeks after starting back on Jardiance and then get labs rechecked.

## 2015-10-22 DIAGNOSIS — I10 Essential (primary) hypertension: Secondary | ICD-10-CM | POA: Diagnosis not present

## 2015-10-23 ENCOUNTER — Telehealth: Payer: Self-pay | Admitting: *Deleted

## 2015-10-23 DIAGNOSIS — IMO0001 Reserved for inherently not codable concepts without codable children: Secondary | ICD-10-CM

## 2015-10-23 DIAGNOSIS — E1165 Type 2 diabetes mellitus with hyperglycemia: Principal | ICD-10-CM

## 2015-10-23 LAB — RENAL FUNCTION PANEL
Albumin: 3.8 g/dL (ref 3.6–4.8)
BUN/Creatinine Ratio: 19 (ref 10–24)
BUN: 32 mg/dL — ABNORMAL HIGH (ref 8–27)
CO2: 20 mmol/L (ref 18–29)
Calcium: 9.8 mg/dL (ref 8.6–10.2)
Chloride: 96 mmol/L (ref 96–106)
Creatinine, Ser: 1.72 mg/dL — ABNORMAL HIGH (ref 0.76–1.27)
GFR calc Af Amer: 47 mL/min/{1.73_m2} — ABNORMAL LOW (ref >59)
GFR calc non Af Amer: 41 mL/min/{1.73_m2} — ABNORMAL LOW (ref >59)
Glucose: 202 mg/dL — ABNORMAL HIGH (ref 65–99)
Phosphorus: 4 mg/dL (ref 2.5–4.5)
Potassium: 4.3 mmol/L (ref 3.5–5.2)
Sodium: 136 mmol/L (ref 134–144)

## 2015-10-23 MED ORDER — EMPAGLIFLOZIN 10 MG PO TABS: 10.0000 mg | ORAL_TABLET | Freq: Every day | ORAL | Status: DC

## 2015-10-23 MED ORDER — LISINOPRIL 20 MG PO TABS: 20.0000 mg | ORAL_TABLET | Freq: Every day | ORAL | Status: DC

## 2015-10-23 NOTE — Telephone Encounter (Signed)
-----   Message from [name], MD sent at 10/23/2015  8:01 AM EDT ----- Kidney functions have declined a little bit. Decrease lisinopril to 20mg  daily, #30, rf x 1. Continue Jardiance 10mg  daily. Can have 1 months samples if out. Follow up o.v. To check BP and labs in 4 weeks.

## 2015-10-23 NOTE — Telephone Encounter (Signed)
Patient was notified of results. Expressed understanding. Rx sent to pharmacy. Samples given.

## 2015-11-12 DIAGNOSIS — E782 Mixed hyperlipidemia: Secondary | ICD-10-CM | POA: Diagnosis not present

## 2015-11-12 DIAGNOSIS — I2510 Atherosclerotic heart disease of native coronary artery without angina pectoris: Secondary | ICD-10-CM | POA: Diagnosis not present

## 2015-11-12 DIAGNOSIS — I10 Essential (primary) hypertension: Secondary | ICD-10-CM | POA: Diagnosis not present

## 2015-11-21 ENCOUNTER — Telehealth: Payer: Self-pay | Admitting: Family Medicine

## 2015-11-21 DIAGNOSIS — IMO0001 Reserved for inherently not codable concepts without codable children: Secondary | ICD-10-CM

## 2015-11-21 DIAGNOSIS — E1165 Type 2 diabetes mellitus with hyperglycemia: Principal | ICD-10-CM

## 2015-11-21 NOTE — Telephone Encounter (Signed)
Please check with patient to see how he is doing with the jardiance samples. We need to see him for follow up o.v. This month and check his A1c and blood pressure. I think we've got more samples of Jardiance 10mg  in if he is out.

## 2015-11-21 NOTE — Telephone Encounter (Signed)
Needs to schedule office visit this month. Can have 4 weeks of samples of Jardiance 10mg  daily.

## 2015-11-21 NOTE — Telephone Encounter (Signed)
Patient stated that he is doing good on Jardiance. He would like to have more samples.

## 2015-11-24 MED ORDER — EMPAGLIFLOZIN 10 MG PO TABS: 10.0000 mg | ORAL_TABLET | Freq: Every day | ORAL | 0 refills | Status: DC

## 2015-11-24 NOTE — Telephone Encounter (Signed)
Samples left at front desk. Patient notified.

## 2015-12-15 ENCOUNTER — Other Ambulatory Visit: Payer: Self-pay | Admitting: Family Medicine

## 2015-12-29 ENCOUNTER — Encounter: Payer: Self-pay | Admitting: Family Medicine

## 2015-12-29 ENCOUNTER — Ambulatory Visit (INDEPENDENT_AMBULATORY_CARE_PROVIDER_SITE_OTHER): Payer: Medicare Other | Admitting: Family Medicine

## 2015-12-29 VITALS — BP 116/70 | HR 64 | Temp 97.0°F | Resp 16 | Ht 67.0 in | Wt 260.0 lb

## 2015-12-29 DIAGNOSIS — I2510 Atherosclerotic heart disease of native coronary artery without angina pectoris: Secondary | ICD-10-CM

## 2015-12-29 DIAGNOSIS — IMO0001 Reserved for inherently not codable concepts without codable children: Secondary | ICD-10-CM

## 2015-12-29 DIAGNOSIS — E1165 Type 2 diabetes mellitus with hyperglycemia: Secondary | ICD-10-CM

## 2015-12-29 DIAGNOSIS — I10 Essential (primary) hypertension: Secondary | ICD-10-CM

## 2015-12-29 LAB — POCT GLYCOSYLATED HEMOGLOBIN (HGB A1C)
Est. average glucose Bld gHb Est-mCnc: 171
Hemoglobin A1C: 7.6

## 2015-12-29 MED ORDER — EMPAGLIFLOZIN 10 MG PO TABS: 10.0000 mg | ORAL_TABLET | Freq: Every day | ORAL | 4 refills | Status: DC

## 2015-12-29 NOTE — Progress Notes (Signed)
Patient: Todd Potter Male    DOB: December 03, 1950   65 y.o.   MRN: [id number] Visit Date: 12/29/2015  Today's Provider: [name], MD   Chief Complaint  Patient presents with  . Diabetes   Subjective:    HPI  Diabetes Mellitus Type II, Follow-up:   Lab Results  Component Value Date   HGBA1C 7.6 12/29/2015   HGBA1C 8.7 08/28/2015   HGBA1C 8.9 06/26/2015    Last seen for diabetes 4 months ago.  Management since then includes added Jardiance. He reports excellent compliance with treatment. He is having side effects.   Current symptoms include none and have been stable. Home blood sugar records: not being checked  Episodes of hypoglycemia? no   Current Insulin Regimen:  Most Recent Eye Exam: [facility] Weight trend: stable Prior visit with dietician: no Current diet: in general, a "healthy" diet   Current exercise: none  Pertinent Labs:    Component Value Date/Time   CHOL 131 06/26/2015 1033   TRIG 81 06/26/2015 1033   HDL 31 (L) 06/26/2015 1033   LDLCALC 84 06/26/2015 1033   CREATININE 1.72 (H) 10/22/2015 1113    Wt Readings from Last 3 Encounters:  12/29/15 260 lb (117.9 kg)  08/28/15 266 lb (120.7 kg)  06/26/15 267 lb (121.1 kg)   BMP Latest Ref Rng & Units 10/22/2015 06/26/2015 02/10/2015  Glucose 65 - 99 mg/dL 202(H) 183(H) 195(H)  BUN 8 - 27 mg/dL 32(H) 21 20  Creatinine 0.76 - 1.27 mg/dL 1.72(H) 1.29(H) 1.31(H)  BUN/Creat Ratio 10 - '24 19 16 15  '$ Sodium 134 - 144 mmol/L 136 135 139  Potassium 3.5 - 5.2 mmol/L 4.3 4.4 4.4  Chloride 96 - 106 mmol/L 96 96 95(L)  CO2 18 - 29 mmol/L '20 24 23  '$ Calcium 8.6 - 10.2 mg/dL 9.8 9.2 9.6    ------------------------------------------------------------------------   Hypertension, follow-up:  BP Readings from Last 3 Encounters:  12/29/15 116/70  08/28/15 140/72  06/26/15 130/80    He was last seen for hypertension 4 months ago.  BP at that visit was 140/70. Management changes since that visit  include decreasing Lisinopril to 20 mg. He reports excellent compliance with treatment. He is having side effects.  He is not exercising. He is adherent to low salt diet.   Outside blood pressures are stable. He is experiencing none.  Patient denies chest pain and lower extremity edema.   Cardiovascular risk factors include advanced age (older than 67 for men, 76 for women), diabetes mellitus, hypertension and obesity (BMI >= 30 kg/m2).  Use of agents associated with hypertension: none.     Weight trend: stable Wt Readings from Last 3 Encounters:  12/29/15 260 lb (117.9 kg)  08/28/15 266 lb (120.7 kg)  06/26/15 267 lb (121.1 kg)    Current diet: in general, a "healthy" diet    ------------------------------------------------------------------------      No Known Allergies   Current Outpatient Prescriptions:  .  amLODipine (NORVASC) 10 MG tablet, Take 1 tablet (10 mg total) by mouth daily., Disp: 90 tablet, Rfl: 4 .  aspirin 81 MG tablet, Take 1 tablet by mouth daily., Disp: , Rfl:  .  atorvastatin (LIPITOR) 80 MG tablet, TAKE 1 TABLET DAILY, Disp: 90 tablet, Rfl: 4 .  empagliflozin (JARDIANCE) 10 MG TABS tablet, Take 10 mg by mouth daily., Disp: 28 tablet, Rfl: 0 .  glipiZIDE (GLUCOTROL XL) 10 MG 24 hr tablet, Take 1 tablet (10 mg total)  by mouth daily., Disp: 90 tablet, Rfl: 4 .  glucose blood (TRUETEST TEST) test strip, TRUETEST TEST (In Vitro Strip)  Check blood sugar daily for 0 days  Quantity: 100.00;  Refills: 12   Ordered :01-Mar-2011  Mila Merry MD;  Started 25-September-2007 Active Comments: fishdona (09/25/2007) - With 100 lancets, Disp: , Rfl:  .  hydrochlorothiazide (HYDRODIURIL) 25 MG tablet, Take 1 tablet (25 mg total) by mouth daily., Disp: 30 tablet, Rfl: 6 .  JANUMET 50-1000 MG tablet, TAKE 1 TABLET BY MOUTH TWICE A DAY, Disp: 60 tablet, Rfl: 12 .  lisinopril (PRINIVIL,ZESTRIL) 20 MG tablet, TAKE 1 TABLET (20 MG TOTAL) BY MOUTH DAILY., Disp: 30 tablet, Rfl: 12 .   losartan (COZAAR) 50 MG tablet, Take 50 mg by mouth daily., Disp: , Rfl:  .  metoprolol (TOPROL-XL) 200 MG 24 hr tablet, TAKE 1 TABLET DAILY, Disp: 30 tablet, Rfl: 12 .  nitroGLYCERIN (NITROSTAT) 0.4 MG SL tablet, Place 1 tablet under the tongue. 3-5 minutes x3 as nedded, Disp: , Rfl:  .  sildenafil (VIAGRA) 100 MG tablet, Take by mouth. 1/2-1 tablet as needed, no more than 1 a day, Disp: , Rfl:   Review of Systems  Constitutional: Negative.   Cardiovascular: Negative.   Endocrine: Negative.     Social History  Substance Use Topics  . Smoking status: Former Smoker    Quit date: 04/19/2006  . Smokeless tobacco: Never Used  . Alcohol use 0.0 oz/week     Comment: drinks 1 beer monthly   Objective:   BP 116/70 (BP Location: Left Arm, Patient Position: Sitting, Cuff Size: Large)   Pulse 64   Temp 97 F (36.1 C) (Oral)   Resp 16   Ht 5\' 7"  (1.702 m)   Wt 260 lb (117.9 kg)   SpO2 97%   BMI 40.72 kg/m   Physical Exam   General Appearance:    Alert, cooperative, no distress  Eyes:    PERRL, conjunctiva/corneas clear, EOM's intact       Lungs:     Clear to auscultation bilaterally, respirations unlabored  Heart:    Regular rate and rhythm  Neurologic:   Awake, alert, oriented x 3. No apparent focal neurological           defect.        Results for orders placed or performed in visit on 12/29/15  POCT glycosylated hemoglobin (Hb A1C)  Result Value Ref Range   Hemoglobin A1C 7.6    Est. average glucose Bld gHb Est-mCnc 171        Assessment & Plan:     1. Uncontrolled type 2 diabetes mellitus without complication, without long-term current use of insulin (HCC) Improved since starting Jardiance, and tolerating well, but decrease in eGFR noted. Will recheck renal panel today. If stable will send in 90 day rx to his mail order pharmacy - POCT glycosylated hemoglobin (Hb A1C)   2. Essential hypertension Improved since starting Jardiance. Will d/c losartan.  - Renal function  panel  3. Coronary artery disease involving native coronary artery of native heart without angina pectoris Asymptomatic. Compliant with medication.  Continue aggressive risk factor modification.       The entirety of the information documented in the History of Present Illness, Review of Systems and Physical Exam were personally obtained by me. Portions of this information were initially documented by 02/28/16, CMA and reviewed by me for thoroughness and accuracy.    Rondel Baton, MD  [facility]  [facility]

## 2015-12-29 NOTE — Patient Instructions (Signed)
Stop taking Cozaar (losartan)

## 2015-12-30 ENCOUNTER — Telehealth: Payer: Self-pay

## 2015-12-30 ENCOUNTER — Other Ambulatory Visit: Payer: Self-pay | Admitting: Family Medicine

## 2015-12-30 DIAGNOSIS — IMO0001 Reserved for inherently not codable concepts without codable children: Secondary | ICD-10-CM

## 2015-12-30 DIAGNOSIS — E1165 Type 2 diabetes mellitus with hyperglycemia: Principal | ICD-10-CM

## 2015-12-30 LAB — RENAL FUNCTION PANEL
Albumin: 4.2 g/dL (ref 3.6–4.8)
BUN/Creatinine Ratio: 14 (ref 10–24)
BUN: 21 mg/dL (ref 8–27)
CO2: 22 mmol/L (ref 18–29)
Calcium: 9.9 mg/dL (ref 8.6–10.2)
Chloride: 98 mmol/L (ref 96–106)
Creatinine, Ser: 1.51 mg/dL — ABNORMAL HIGH (ref 0.76–1.27)
GFR calc Af Amer: 55 mL/min/{1.73_m2} — ABNORMAL LOW (ref >59)
GFR calc non Af Amer: 47 mL/min/{1.73_m2} — ABNORMAL LOW (ref >59)
Glucose: 136 mg/dL — ABNORMAL HIGH (ref 65–99)
Phosphorus: 4.1 mg/dL (ref 2.5–4.5)
Potassium: 4.5 mmol/L (ref 3.5–5.2)
Sodium: 138 mmol/L (ref 134–144)

## 2015-12-30 MED ORDER — EMPAGLIFLOZIN 10 MG PO TABS: 10.0000 mg | ORAL_TABLET | Freq: Every day | ORAL | 4 refills | Status: DC

## 2015-12-30 NOTE — Telephone Encounter (Signed)
Tried calling; pt voice mail is not set up.   Thanks,   -Mickel Baas

## 2015-12-30 NOTE — Telephone Encounter (Signed)
-----   Message from [name], MD sent at 12/30/2015  7:51 AM EDT ----- Electrolyte are normal. Kidney functions are better. Continue current dose of jardiance. Will send prescription his mail order pharmacy. Follow up in December as scheduled.

## 2015-12-30 NOTE — Telephone Encounter (Signed)
Pt advised.   Thanks,   -Laura  

## 2016-01-08 ENCOUNTER — Telehealth: Payer: Self-pay

## 2016-01-08 NOTE — Telephone Encounter (Signed)
Can have 3 weeks samples of Jardiance 10. If we only have the 25mg  he take 1/2 of a 25mg  daily.

## 2016-01-08 NOTE — Telephone Encounter (Signed)
Patient reports that he received a letter in the mail saying that his jardiance is no longer covered. Patient thinks that this may need a prior auth. Patient is also requesting samples until medication is approved. Ok to give samples? Please advise. Thanks!

## 2016-01-09 NOTE — Telephone Encounter (Signed)
Placed 2 weeks of Jardiance up at the front desk. Advised patient.

## 2016-02-04 ENCOUNTER — Other Ambulatory Visit: Payer: Self-pay | Admitting: Family Medicine

## 2016-02-04 NOTE — Telephone Encounter (Signed)
Pt is requesting refill on Janumet 50-1000 MG sent to [facility]

## 2016-02-05 MED ORDER — SITAGLIPTIN PHOS-METFORMIN HCL 50-1000 MG PO TABS: 1.0000 | ORAL_TABLET | Freq: Two times a day (BID) | ORAL | 12 refills | Status: DC

## 2016-02-05 NOTE — Telephone Encounter (Signed)
Ok to refill? Please advise. Thanks!  

## 2016-02-20 DIAGNOSIS — M25561 Pain in right knee: Secondary | ICD-10-CM | POA: Diagnosis not present

## 2016-02-20 DIAGNOSIS — M1711 Unilateral primary osteoarthritis, right knee: Secondary | ICD-10-CM | POA: Diagnosis not present

## 2016-02-26 DIAGNOSIS — H6063 Unspecified chronic otitis externa, bilateral: Secondary | ICD-10-CM | POA: Diagnosis not present

## 2016-02-26 DIAGNOSIS — H6123 Impacted cerumen, bilateral: Secondary | ICD-10-CM | POA: Diagnosis not present

## 2016-03-01 ENCOUNTER — Other Ambulatory Visit: Payer: Self-pay | Admitting: *Deleted

## 2016-03-01 DIAGNOSIS — E1165 Type 2 diabetes mellitus with hyperglycemia: Principal | ICD-10-CM

## 2016-03-01 DIAGNOSIS — IMO0001 Reserved for inherently not codable concepts without codable children: Secondary | ICD-10-CM

## 2016-03-01 MED ORDER — SITAGLIPTIN PHOS-METFORMIN HCL 50-1000 MG PO TABS: 1.0000 | ORAL_TABLET | Freq: Two times a day (BID) | ORAL | 0 refills | Status: DC

## 2016-03-01 NOTE — Telephone Encounter (Signed)
Patient is requesting 90 day supply faxed to [facility] mail order.

## 2016-03-03 MED ORDER — GLIPIZIDE ER 10 MG PO TB24: 10.0000 mg | ORAL_TABLET | Freq: Every day | ORAL | 4 refills | Status: DC

## 2016-03-03 MED ORDER — SITAGLIPTIN PHOS-METFORMIN HCL 50-1000 MG PO TABS: 1.0000 | ORAL_TABLET | Freq: Two times a day (BID) | ORAL | 4 refills | Status: DC

## 2016-03-03 MED ORDER — HYDROCHLOROTHIAZIDE 25 MG PO TABS: 25.0000 mg | ORAL_TABLET | Freq: Every day | ORAL | 4 refills | Status: DC

## 2016-03-03 MED ORDER — METOPROLOL SUCCINATE ER 200 MG PO TB24: 200.0000 mg | ORAL_TABLET | Freq: Every day | ORAL | 4 refills | Status: DC

## 2016-03-03 MED ORDER — ATORVASTATIN CALCIUM 80 MG PO TABS: 80.0000 mg | ORAL_TABLET | Freq: Every day | ORAL | 4 refills | Status: DC

## 2016-03-03 MED ORDER — LISINOPRIL 20 MG PO TABS: 20.0000 mg | ORAL_TABLET | Freq: Every day | ORAL | 4 refills | Status: DC

## 2016-03-03 MED ORDER — EMPAGLIFLOZIN 10 MG PO TABS: 10.0000 mg | ORAL_TABLET | Freq: Every day | ORAL | 4 refills | Status: DC

## 2016-03-03 MED ORDER — AMLODIPINE BESYLATE 10 MG PO TABS: 10.0000 mg | ORAL_TABLET | Freq: Every day | ORAL | 4 refills | Status: DC

## 2016-03-03 NOTE — Telephone Encounter (Signed)
Rx's were faxed to [facility] mail order pharmacy. [phone]

## 2016-03-04 ENCOUNTER — Other Ambulatory Visit: Payer: Self-pay | Admitting: Family Medicine

## 2016-03-04 DIAGNOSIS — H6121 Impacted cerumen, right ear: Secondary | ICD-10-CM | POA: Diagnosis not present

## 2016-03-04 DIAGNOSIS — H6061 Unspecified chronic otitis externa, right ear: Secondary | ICD-10-CM | POA: Diagnosis not present

## 2016-03-27 ENCOUNTER — Other Ambulatory Visit: Payer: Self-pay | Admitting: Family Medicine

## 2016-04-01 ENCOUNTER — Encounter: Payer: Self-pay | Admitting: Family Medicine

## 2016-04-01 ENCOUNTER — Ambulatory Visit (INDEPENDENT_AMBULATORY_CARE_PROVIDER_SITE_OTHER): Payer: Medicare Other | Admitting: Family Medicine

## 2016-04-01 VITALS — BP 124/80 | HR 69 | Temp 98.1°F | Resp 16 | Ht 67.0 in | Wt 264.0 lb

## 2016-04-01 DIAGNOSIS — E0821 Diabetes mellitus due to underlying condition with diabetic nephropathy: Secondary | ICD-10-CM | POA: Diagnosis not present

## 2016-04-01 DIAGNOSIS — I2510 Atherosclerotic heart disease of native coronary artery without angina pectoris: Secondary | ICD-10-CM

## 2016-04-01 DIAGNOSIS — E119 Type 2 diabetes mellitus without complications: Secondary | ICD-10-CM

## 2016-04-01 DIAGNOSIS — E7800 Pure hypercholesterolemia, unspecified: Secondary | ICD-10-CM

## 2016-04-01 DIAGNOSIS — I10 Essential (primary) hypertension: Secondary | ICD-10-CM | POA: Diagnosis not present

## 2016-04-01 NOTE — Progress Notes (Signed)
Patient: Todd Potter Male    DOB: 1950/07/29   65 y.o.   MRN: [id number] Visit Date: 04/01/2016  Today's Provider: [name], MD   Chief Complaint  Patient presents with  . Follow-up  . Diabetes  . Hypertension  . Coronary Artery Disease   Subjective:    HPI  Coronary artery disease involving native coronary artery of native heart without angina pectoris From 12/29/2015-no changes.    Diabetes Mellitus Type II, Follow-up:   Lab Results  Component Value Date   HGBA1C 7.6 12/29/2015   HGBA1C 8.7 08/28/2015   HGBA1C 8.9 06/26/2015   Last seen for diabetes 3 months ago.  Management since then includes; no changes. He reports good compliance with treatment. He is not having side effects. none Current symptoms include none and have been unchanged. Home blood sugar records: fasting range: not checking  Episodes of hypoglycemia? no   Current Insulin Regimen: n/a Most Recent Eye Exam: 04/20/2015-normal Weight trend: stable Prior visit with dietician: no Current diet: well balanced Current exercise: none  ----------------------------------------------------------------   Hypertension, follow-up:  BP Readings from Last 3 Encounters:  12/29/15 116/70  08/28/15 140/72  06/26/15 130/80    He was last seen for hypertension 3 months ago.  BP at that visit was 116/70. Management since that visit includes; labs chacked, discontinued Losartan.He reports good compliance with treatment. He is not having side effects. none He is not exercising. He is adherent to low salt diet.   Outside blood pressures are 120/80. He is experiencing none.  Patient denies none.   Cardiovascular risk factors include diabetes mellitus.  Use of agents associated with hypertension: none.   ----------------------------------------------------------------    No Known Allergies   Current Outpatient Prescriptions:  .  amLODipine (NORVASC) 10 MG tablet, Take 1 tablet (10 mg  total) by mouth daily., Disp: 90 tablet, Rfl: 4 .  aspirin 81 MG tablet, Take 1 tablet by mouth daily., Disp: , Rfl:  .  atorvastatin (LIPITOR) 80 MG tablet, Take 1 tablet (80 mg total) by mouth daily., Disp: 90 tablet, Rfl: 4 .  empagliflozin (JARDIANCE) 10 MG TABS tablet, Take 10 mg by mouth daily., Disp: 90 tablet, Rfl: 4 .  glipiZIDE (GLUCOTROL XL) 10 MG 24 hr tablet, TAKE 1 TABLET DAILY, Disp: 90 tablet, Rfl: 3 .  glucose blood (TRUETEST TEST) test strip, TRUETEST TEST (In Vitro Strip)  Check blood sugar daily for 0 days  Quantity: 100.00;  Refills: 12   Ordered :01-Mar-2011  [name] MD;  Started 25-September-2007 Active Comments: fishdona (09/25/2007) - With 100 lancets, Disp: , Rfl:  .  hydrochlorothiazide (HYDRODIURIL) 25 MG tablet, Take 1 tablet (25 mg total) by mouth daily., Disp: 90 tablet, Rfl: 4 .  lisinopril (PRINIVIL,ZESTRIL) 20 MG tablet, Take 1 tablet (20 mg total) by mouth daily., Disp: 90 tablet, Rfl: 4 .  metoprolol (TOPROL-XL) 200 MG 24 hr tablet, Take 1 tablet (200 mg total) by mouth daily., Disp: 90 tablet, Rfl: 4 .  nitroGLYCERIN (NITROSTAT) 0.4 MG SL tablet, Place 1 tablet under the tongue. 3-5 minutes x3 as nedded, Disp: , Rfl:  .  sildenafil (VIAGRA) 100 MG tablet, Take by mouth. 1/2-1 tablet as needed, no more than 1 a day, Disp: , Rfl:  .  sitaGLIPtin-metformin (JANUMET) 50-1000 MG tablet, Take 1 tablet by mouth 2 (two) times daily., Disp: 180 tablet, Rfl: 4  Review of Systems  Constitutional: Negative for appetite change, chills and fever.  Respiratory: Negative  for chest tightness, shortness of breath and wheezing.   Cardiovascular: Negative for chest pain and palpitations.  Gastrointestinal: Negative for abdominal pain, nausea and vomiting.    Social History  Substance Use Topics  . Smoking status: Former Smoker    Quit date: 04/19/2006  . Smokeless tobacco: Never Used  . Alcohol use 0.0 oz/week     Comment: drinks 1 beer monthly   Objective:   BP 124/80  (BP Location: Left Arm, Patient Position: Sitting, Cuff Size: Large)   Pulse 69   Temp 98.1 F (36.7 C) (Oral)   Resp 16   Ht 5\' 7"  (1.702 m)   Wt 264 lb (119.7 kg)   SpO2 95%   BMI 41.35 kg/m   Physical Exam   General Appearance:    Alert, cooperative, no distress, obese  Eyes:    PERRL, conjunctiva/corneas clear, EOM's intact       Lungs:     Clear to auscultation bilaterally, respirations unlabored  Heart:    Regular rate and rhythm  Neurologic:   Awake, alert, oriented x 3. No apparent focal neurological           defect.       HgbA1c- error - high hemoglobin     Assessment & Plan:     1. Diabetes mellitus due to underlying condition, controlled, with diabetic nephropathy, without long-term current use of insulin ([name]) Doing well on current medications. He reports he has already had flu vaccine this year.  - POCT glycosylated hemoglobin (Hb A1C) - Comprehensive metabolic panel - Hemoglobin A1c  2. Essential hypertension Stable. Continue current medications.    3. Coronary artery disease involving native coronary artery of native heart without angina pectoris Asymptomatic. Compliant with medication.  Continue aggressive risk factor modification.  Follow up Dr. Charlane Ferretti as scheduled.  - Comprehensive metabolic panel - CBC - TSH  4. Pure hypercholesterolemia He is tolerating atorvastatin well with no adverse effects.   - CBC - Lipid panel - TSH  Return in about 4 months (around 07/31/2016).     The entirety of the information documented in the History of Present Illness, Review of Systems and Physical Exam were personally obtained by me. Portions of this information were initially documented by April M. Sabra Heck, CMA and reviewed by me for thoroughness and accuracy.    [name], MD  [facility]

## 2016-04-02 LAB — CBC
Hematocrit: 47.9 % (ref 37.5–51.0)
Hemoglobin: 15.1 g/dL (ref 13.0–17.7)
MCH: 28 pg (ref 26.6–33.0)
MCHC: 31.5 g/dL (ref 31.5–35.7)
MCV: 89 fL (ref 79–97)
Platelets: 188 10*3/uL (ref 150–379)
RBC: 5.39 x10E6/uL (ref 4.14–5.80)
RDW: 15.9 % — ABNORMAL HIGH (ref 12.3–15.4)
WBC: 10.1 10*3/uL (ref 3.4–10.8)

## 2016-04-02 LAB — COMPREHENSIVE METABOLIC PANEL
ALT: 21 IU/L (ref 0–44)
AST: 14 IU/L (ref 0–40)
Albumin/Globulin Ratio: 1.1 — ABNORMAL LOW (ref 1.2–2.2)
Albumin: 4.1 g/dL (ref 3.6–4.8)
Alkaline Phosphatase: 96 IU/L (ref 39–117)
BUN/Creatinine Ratio: 11 (ref 10–24)
BUN: 17 mg/dL (ref 8–27)
Bilirubin Total: 0.3 mg/dL (ref 0.0–1.2)
CO2: 22 mmol/L (ref 18–29)
Calcium: 9.2 mg/dL (ref 8.6–10.2)
Chloride: 99 mmol/L (ref 96–106)
Creatinine, Ser: 1.49 mg/dL — ABNORMAL HIGH (ref 0.76–1.27)
GFR calc Af Amer: 56 mL/min/{1.73_m2} — ABNORMAL LOW (ref >59)
GFR calc non Af Amer: 48 mL/min/{1.73_m2} — ABNORMAL LOW (ref >59)
Globulin, Total: 3.7 g/dL (ref 1.5–4.5)
Glucose: 166 mg/dL — ABNORMAL HIGH (ref 65–99)
Potassium: 4.8 mmol/L (ref 3.5–5.2)
Sodium: 140 mmol/L (ref 134–144)
Total Protein: 7.8 g/dL (ref 6.0–8.5)

## 2016-04-02 LAB — LIPID PANEL
Chol/HDL Ratio: 3.6 ratio units (ref 0.0–5.0)
Cholesterol, Total: 124 mg/dL (ref 100–199)
HDL: 34 mg/dL — ABNORMAL LOW (ref >39)
LDL Calculated: 71 mg/dL (ref 0–99)
Triglycerides: 95 mg/dL (ref 0–149)
VLDL Cholesterol Cal: 19 mg/dL (ref 5–40)

## 2016-04-02 LAB — HEMOGLOBIN A1C
Est. average glucose Bld gHb Est-mCnc: 186 mg/dL
Hgb A1c MFr Bld: 8.1 % — ABNORMAL HIGH (ref 4.8–5.6)

## 2016-04-02 LAB — TSH: TSH: 0.468 u[IU]/mL (ref 0.450–4.500)

## 2016-04-26 ENCOUNTER — Other Ambulatory Visit: Payer: Self-pay | Admitting: Family Medicine

## 2016-05-14 DIAGNOSIS — Z6841 Body Mass Index (BMI) 40.0 and over, adult: Secondary | ICD-10-CM | POA: Diagnosis not present

## 2016-05-14 DIAGNOSIS — Z86010 Personal history of colonic polyps: Secondary | ICD-10-CM | POA: Diagnosis not present

## 2016-05-15 ENCOUNTER — Other Ambulatory Visit: Payer: Self-pay | Admitting: Family Medicine

## 2016-05-20 DIAGNOSIS — I10 Essential (primary) hypertension: Secondary | ICD-10-CM | POA: Diagnosis not present

## 2016-05-20 DIAGNOSIS — Z6841 Body Mass Index (BMI) 40.0 and over, adult: Secondary | ICD-10-CM | POA: Diagnosis not present

## 2016-05-20 DIAGNOSIS — I2510 Atherosclerotic heart disease of native coronary artery without angina pectoris: Secondary | ICD-10-CM | POA: Diagnosis not present

## 2016-05-20 DIAGNOSIS — E782 Mixed hyperlipidemia: Secondary | ICD-10-CM | POA: Diagnosis not present

## 2016-07-27 ENCOUNTER — Other Ambulatory Visit: Payer: Self-pay

## 2016-07-27 DIAGNOSIS — IMO0001 Reserved for inherently not codable concepts without codable children: Secondary | ICD-10-CM

## 2016-07-27 DIAGNOSIS — E1165 Type 2 diabetes mellitus with hyperglycemia: Principal | ICD-10-CM

## 2016-07-27 MED ORDER — EMPAGLIFLOZIN 10 MG PO TABS: 10.0000 mg | ORAL_TABLET | Freq: Every day | ORAL | 0 refills | Status: DC

## 2016-07-27 NOTE — Telephone Encounter (Signed)
Patient was at the front desk requesting samples of Jardiance. OK per Dr. Caryn Section to give samples. Samples given to patient. Patient has a follow up appointment scheduled 08/05/2016 at 8:15am.

## 2016-07-30 ENCOUNTER — Telehealth: Payer: Self-pay | Admitting: Family Medicine

## 2016-07-30 NOTE — Telephone Encounter (Signed)
Called Pt to schedule AWV with [facility] - knb °

## 2016-08-05 ENCOUNTER — Encounter: Payer: Self-pay | Admitting: *Deleted

## 2016-08-05 ENCOUNTER — Ambulatory Visit (INDEPENDENT_AMBULATORY_CARE_PROVIDER_SITE_OTHER): Payer: Medicare Other | Admitting: Family Medicine

## 2016-08-05 ENCOUNTER — Encounter: Payer: Self-pay | Admitting: Family Medicine

## 2016-08-05 VITALS — BP 126/80 | HR 64 | Temp 98.0°F | Resp 16 | Ht 66.5 in | Wt 261.0 lb

## 2016-08-05 DIAGNOSIS — I2510 Atherosclerotic heart disease of native coronary artery without angina pectoris: Secondary | ICD-10-CM

## 2016-08-05 DIAGNOSIS — I10 Essential (primary) hypertension: Secondary | ICD-10-CM | POA: Diagnosis not present

## 2016-08-05 DIAGNOSIS — IMO0001 Reserved for inherently not codable concepts without codable children: Secondary | ICD-10-CM

## 2016-08-05 DIAGNOSIS — E1165 Type 2 diabetes mellitus with hyperglycemia: Secondary | ICD-10-CM | POA: Diagnosis not present

## 2016-08-05 LAB — POCT GLYCOSYLATED HEMOGLOBIN (HGB A1C)
Est. average glucose Bld gHb Est-mCnc: 197
Hemoglobin A1C: 8.5

## 2016-08-05 MED ORDER — EMPAGLIFLOZIN 10 MG PO TABS: 10.0000 mg | ORAL_TABLET | Freq: Every day | ORAL | 3 refills | Status: DC

## 2016-08-05 MED ORDER — METOPROLOL SUCCINATE ER 200 MG PO TB24: 200.0000 mg | ORAL_TABLET | Freq: Every day | ORAL | 4 refills | Status: DC

## 2016-08-05 NOTE — Patient Instructions (Signed)
You can take 1/2 of 25mg  Jaridance tablet if you run out of the 10mg  tablets.

## 2016-08-05 NOTE — Progress Notes (Signed)
Patient: Todd Potter Male    DOB: 02-Apr-1951   66 y.o.   MRN: [id number] Visit Date: 08/05/2016  Today's Provider: [name], MD   No chief complaint on file.  Subjective:    HPI  Diabetes Mellitus Type II, Follow-up:   Lab Results  Component Value Date   HGBA1C 8.5 08/05/2016   HGBA1C 8.1 (H) 04/01/2016   HGBA1C 7.6 12/29/2015    Last seen for diabetes 4 months ago.  Management since then includes avoiding sweets and starchy foods and increase exercise. He reports good compliance with treatment. However, he had been out of Jardiance for about 3 weeks and restarted last week He is not having side effects.  Current symptoms include none and have been stable. Home blood sugar records: not being checked  Episodes of hypoglycemia? no   Current Insulin Regimen: none Most Recent Eye Exam: due Weight trend: stable Prior visit with dietician: no Current diet: well balanced Current exercise: none  Pertinent Labs:    Component Value Date/Time   CHOL 124 04/01/2016 1026   TRIG 95 04/01/2016 1026   HDL 34 (L) 04/01/2016 1026   LDLCALC 71 04/01/2016 1026   CREATININE 1.49 (H) 04/01/2016 1026    Wt Readings from Last 3 Encounters:  08/05/16 261 lb (118.4 kg)  04/01/16 264 lb (119.7 kg)  12/29/15 260 lb (117.9 kg)      Hypertension, follow-up:  BP Readings from Last 3 Encounters:  08/05/16 126/80  04/01/16 124/80  12/29/15 116/70    He was last seen for hypertension 4 months ago.  BP at that visit was 124/80. Management since that visit includes no changes. He reports good compliance with treatment. He is not having side effects.  He is not exercising. He is adherent to low salt diet.   Outside blood pressures are not being checked. He is experiencing none.  Patient denies exertional chest pressure/discomfort, lower extremity edema and palpitations.   Cardiovascular risk factors include diabetes mellitus.   Weight trend: stable Wt Readings  from Last 3 Encounters:  08/05/16 261 lb (118.4 kg)  04/01/16 264 lb (119.7 kg)  12/29/15 260 lb (117.9 kg)    Current diet: well balanced         No Known Allergies   Current Outpatient Prescriptions:  .  amLODipine (NORVASC) 10 MG tablet, Take 1 tablet (10 mg total) by mouth daily., Disp: 90 tablet, Rfl: 4 .  aspirin 81 MG tablet, Take 1 tablet by mouth daily., Disp: , Rfl:  .  atorvastatin (LIPITOR) 80 MG tablet, TAKE 1 TABLET DAILY, Disp: 90 tablet, Rfl: 3 .  empagliflozin (JARDIANCE) 10 MG TABS tablet, Take 10 mg by mouth daily., Disp: 14 tablet, Rfl: 0 .  glipiZIDE (GLUCOTROL XL) 10 MG 24 hr tablet, TAKE 1 TABLET DAILY, Disp: 90 tablet, Rfl: 3 .  glucose blood (TRUETEST TEST) test strip, TRUETEST TEST (In Vitro Strip)  Check blood sugar daily for 0 days  Quantity: 100.00;  Refills: 12   Ordered :01-Mar-2011  [name] MD;  Started 25-September-2007 Active Comments: fishdona (09/25/2007) - With 100 lancets, Disp: , Rfl:  .  hydrochlorothiazide (HYDRODIURIL) 25 MG tablet, Take 1 tablet (25 mg total) by mouth daily., Disp: 90 tablet, Rfl: 4 .  JANUMET 50-1000 MG tablet, TAKE 1 TABLET TWICE DAILY  WITH MEALS, Disp: 180 tablet, Rfl: 3 .  lisinopril (PRINIVIL,ZESTRIL) 20 MG tablet, Take 1 tablet (20 mg total) by mouth daily., Disp: 90 tablet, Rfl:  4 .  metoprolol (TOPROL-XL) 200 MG 24 hr tablet, Take 1 tablet (200 mg total) by mouth daily., Disp: 90 tablet, Rfl: 4 .  nitroGLYCERIN (NITROSTAT) 0.4 MG SL tablet, Place 1 tablet under the tongue. 3-5 minutes x3 as nedded, Disp: , Rfl:  .  sildenafil (VIAGRA) 100 MG tablet, Take by mouth. 1/2-1 tablet as needed, no more than 1 a day, Disp: , Rfl:  .  sitaGLIPtin-metformin (JANUMET) 50-1000 MG tablet, Take 1 tablet by mouth 2 (two) times daily., Disp: 180 tablet, Rfl: 4  Review of Systems  Constitutional: Negative.   Respiratory: Negative.   Cardiovascular: Negative.   Endocrine: Negative.   Musculoskeletal: Negative.   Neurological:  Negative.     Social History  Substance Use Topics  . Smoking status: Former Smoker    Quit date: 04/19/2006  . Smokeless tobacco: Never Used  . Alcohol use 0.0 oz/week     Comment: drinks 1 beer monthly   Objective:   BP 126/80 (BP Location: Left Arm, Patient Position: Sitting, Cuff Size: Large)   Pulse 64   Temp 98 F (36.7 C)   Resp 16   Ht 5' 6.5" (1.689 m)   Wt 261 lb (118.4 kg)   SpO2 96%   BMI 41.50 kg/m  Vitals:   08/05/16 0831  BP: 126/80  Pulse: 64  Resp: 16  Temp: 98 F (36.7 C)  SpO2: 96%  Weight: 261 lb (118.4 kg)  Height: 5' 6.5" (1.689 m)     Physical Exam   General Appearance:    Alert, cooperative, no distress  Eyes:    PERRL, conjunctiva/corneas clear, EOM's intact       Lungs:     Clear to auscultation bilaterally, respirations unlabored  Heart:    Regular rate and rhythm  Neurologic:   Awake, alert, oriented x 3. No apparent focal neurological           defect.        Results for orders placed or performed in visit on 08/05/16  POCT glycosylated hemoglobin (Hb A1C)  Result Value Ref Range   Hemoglobin A1C 8.5    Est. average glucose Bld gHb Est-mCnc 197        Assessment & Plan:     1. Uncontrolled type 2 diabetes mellitus without complication, without long-term current use of insulin (HCC) Tolerating medications, but missed a couple weeks of Jardiance due to cost of medication. Given samples of 25mg  to take 1/2 daily. He is to take prescription to pharmacy and see if there is a lower cost alternative - POCT glycosylated hemoglobin (Hb A1C) - empagliflozin (JARDIANCE) 10 MG TABS tablet; Take 10 mg by mouth daily.  Dispense: 30 tablet; Refill: 3  2. Essential hypertension Well controlled.  Continue current medications.    3. Morbid obesity ([place]) Diet and exercise.   4. Coronary artery disease involving native coronary artery of native heart without angina pectoris Asymptomatic. Compliant with medication.  Continue aggressive risk  factor modification.         [name], MD  [facility]

## 2016-08-06 ENCOUNTER — Other Ambulatory Visit: Payer: Self-pay | Admitting: Family Medicine

## 2016-08-06 ENCOUNTER — Encounter
Admission: RE | Disposition: A | Discharge status: Home / Self Care | Payer: Self-pay | Source: Ambulatory Visit | Attending: Unknown Physician Specialty

## 2016-08-06 ENCOUNTER — Ambulatory Visit: Payer: Medicare Other | Admitting: Certified Registered Nurse Anesthetist

## 2016-08-06 ENCOUNTER — Ambulatory Visit
Admission: RE | Admit: 2016-08-06 | Discharge: 2016-08-06 | Disposition: A | Discharge status: Home / Self Care | Payer: Medicare Other | Source: Ambulatory Visit | Attending: Unknown Physician Specialty | Admitting: Unknown Physician Specialty

## 2016-08-06 ENCOUNTER — Encounter: Payer: Self-pay | Admitting: *Deleted

## 2016-08-06 DIAGNOSIS — Z7984 Long term (current) use of oral hypoglycemic drugs: Secondary | ICD-10-CM | POA: Diagnosis not present

## 2016-08-06 DIAGNOSIS — Z7982 Long term (current) use of aspirin: Secondary | ICD-10-CM | POA: Insufficient documentation

## 2016-08-06 DIAGNOSIS — E785 Hyperlipidemia, unspecified: Secondary | ICD-10-CM | POA: Insufficient documentation

## 2016-08-06 DIAGNOSIS — I10 Essential (primary) hypertension: Secondary | ICD-10-CM | POA: Diagnosis not present

## 2016-08-06 DIAGNOSIS — E119 Type 2 diabetes mellitus without complications: Secondary | ICD-10-CM | POA: Insufficient documentation

## 2016-08-06 DIAGNOSIS — K635 Polyp of colon: Secondary | ICD-10-CM | POA: Diagnosis not present

## 2016-08-06 DIAGNOSIS — Z79899 Other long term (current) drug therapy: Secondary | ICD-10-CM | POA: Insufficient documentation

## 2016-08-06 DIAGNOSIS — Z955 Presence of coronary angioplasty implant and graft: Secondary | ICD-10-CM | POA: Diagnosis not present

## 2016-08-06 DIAGNOSIS — I2510 Atherosclerotic heart disease of native coronary artery without angina pectoris: Secondary | ICD-10-CM | POA: Insufficient documentation

## 2016-08-06 DIAGNOSIS — I252 Old myocardial infarction: Secondary | ICD-10-CM | POA: Insufficient documentation

## 2016-08-06 DIAGNOSIS — Z86010 Personal history of colonic polyps: Secondary | ICD-10-CM | POA: Diagnosis not present

## 2016-08-06 DIAGNOSIS — K640 First degree hemorrhoids: Secondary | ICD-10-CM | POA: Insufficient documentation

## 2016-08-06 DIAGNOSIS — Z87891 Personal history of nicotine dependence: Secondary | ICD-10-CM | POA: Insufficient documentation

## 2016-08-06 DIAGNOSIS — Z1211 Encounter for screening for malignant neoplasm of colon: Secondary | ICD-10-CM | POA: Insufficient documentation

## 2016-08-06 DIAGNOSIS — K649 Unspecified hemorrhoids: Secondary | ICD-10-CM | POA: Diagnosis not present

## 2016-08-06 DIAGNOSIS — D123 Benign neoplasm of transverse colon: Secondary | ICD-10-CM | POA: Insufficient documentation

## 2016-08-06 HISTORY — PX: COLONOSCOPY WITH PROPOFOL: SHX5780

## 2016-08-06 LAB — GLUCOSE, CAPILLARY: Glucose-Capillary: 181 mg/dL — ABNORMAL HIGH (ref 65–99)

## 2016-08-06 SURGERY — COLONOSCOPY WITH PROPOFOL
Anesthesia: General

## 2016-08-06 MED ORDER — DAPAGLIFLOZIN PROPANEDIOL 5 MG PO TABS: 5.0000 mg | ORAL_TABLET | Freq: Every day | ORAL | 3 refills | Status: DC

## 2016-08-06 MED ORDER — MIDAZOLAM HCL 2 MG/2ML IJ SOLN
INTRAMUSCULAR | Status: AC
  Filled 2016-08-06: qty 2

## 2016-08-06 MED ORDER — PROPOFOL 500 MG/50ML IV EMUL
INTRAVENOUS | Status: AC
  Filled 2016-08-06: qty 50

## 2016-08-06 MED ORDER — PROPOFOL 10 MG/ML IV BOLUS
INTRAVENOUS | Status: DC | PRN
  Administered 2016-08-06: 30 mg via INTRAVENOUS

## 2016-08-06 MED ORDER — SODIUM CHLORIDE 0.9 % IV SOLN: INTRAVENOUS | Status: DC

## 2016-08-06 MED ORDER — FENTANYL CITRATE (PF) 100 MCG/2ML IJ SOLN: 25.0000 ug | INTRAMUSCULAR | Status: DC | PRN

## 2016-08-06 MED ORDER — LIDOCAINE HCL (CARDIAC) 20 MG/ML IV SOLN
INTRAVENOUS | Status: DC | PRN
  Administered 2016-08-06: 50 mg via INTRAVENOUS

## 2016-08-06 MED ORDER — PROPOFOL 500 MG/50ML IV EMUL
INTRAVENOUS | Status: DC | PRN
  Administered 2016-08-06: 150 ug/kg/min via INTRAVENOUS

## 2016-08-06 MED ORDER — METHYLENE BLUE 0.5 % INJ SOLN
INTRAVENOUS | Status: AC
  Filled 2016-08-06: qty 10

## 2016-08-06 MED ORDER — ONDANSETRON HCL 4 MG/2ML IJ SOLN: 4.0000 mg | Freq: Once | INTRAMUSCULAR | Status: DC | PRN

## 2016-08-06 MED ORDER — SODIUM CHLORIDE 0.9 % IV SOLN
INTRAVENOUS | Status: DC
  Administered 2016-08-06: 09:00:00 via INTRAVENOUS

## 2016-08-06 MED ORDER — MIDAZOLAM HCL 2 MG/2ML IJ SOLN
INTRAMUSCULAR | Status: DC | PRN
  Administered 2016-08-06: 2 mg via INTRAVENOUS

## 2016-08-06 NOTE — Anesthesia Preprocedure Evaluation (Signed)
Anesthesia Evaluation  Patient identified by MRN, date of birth, ID band  Reviewed: Allergy & Precautions, NPO status , Patient's Chart, lab work & pertinent test results  Airway Mallampati: II       Dental   Pulmonary former smoker,    Pulmonary exam normal        Cardiovascular hypertension, + CAD and + Past MI  Normal cardiovascular exam     Neuro/Psych negative neurological ROS  negative psych ROS   GI/Hepatic negative GI ROS, Neg liver ROS,   Endo/Other  diabetes, Well Controlled, Type 2, Oral Hypoglycemic Agents  Renal/GU   negative genitourinary   Musculoskeletal   Abdominal Normal abdominal exam  (+)   Peds negative pediatric ROS (+)  Hematology negative hematology ROS (+)   Anesthesia Other Findings   Reproductive/Obstetrics                             Anesthesia Physical Anesthesia Plan  ASA: III  Anesthesia Plan: General   Post-op Pain Management:    Induction: Intravenous  Airway Management Planned: Nasal Cannula  Additional Equipment:   Intra-op Plan:   Post-operative Plan:   Informed Consent: I have reviewed the patients History and Physical, chart, labs and discussed the procedure including the risks, benefits and alternatives for the proposed anesthesia with the patient or authorized representative who has indicated his/her understanding and acceptance.   Dental advisory given  Plan Discussed with: CRNA and Surgeon  Anesthesia Plan Comments:         Anesthesia Quick Evaluation

## 2016-08-06 NOTE — Progress Notes (Signed)
Jardiance not on formulary. Wilder Glade and Invokana are. Change to farxiga 5mg  daily.

## 2016-08-06 NOTE — Op Note (Signed)
[facility] Patient Name: Todd Potter Procedure Date: 08/06/2016 8:33 AM MRN: [id number] Account #: [id number] Date of Birth: 1950-11-17 Admit Type: Outpatient Age: 66 Room: [facility] ENDO ROOM 1 Gender: Male Note Status: Finalized Procedure:            Colonoscopy Indications:          High risk colon cancer surveillance: Personal history                        of colonic polyps Providers:            Manya Silvas, MD Referring MD:         Kirstie Peri. Caryn Section, MD (Referring MD) Medicines:            Propofol per Anesthesia Complications:        No immediate complications. Procedure:            Pre-Anesthesia Assessment:                       - After reviewing the risks and benefits, the patient                        was deemed in satisfactory condition to undergo the                        procedure.                       After obtaining informed consent, the colonoscope was                        passed under direct vision. Throughout the procedure,                        the patient's blood pressure, pulse, and oxygen                        saturations were monitored continuously. The                        Colonoscope was introduced through the anus and                        advanced to the the cecum, identified by appendiceal                        orifice and ileocecal valve. The colonoscopy was                        performed without difficulty. The patient tolerated the                        procedure well. The quality of the bowel preparation                        was adequate to identify polyps. Findings:      A diminutive polyp was found in the transverse colon. The polyp was       sessile. The polyp was removed with a jumbo cold forceps. Resection and       retrieval were complete.      Internal  hemorrhoids were found during endoscopy. The hemorrhoids were       small and Grade I (internal hemorrhoids that do not prolapse).  The exam was otherwise without abnormality. Impression:           - One diminutive polyp in the transverse colon, removed                        with a jumbo cold forceps. Resected and retrieved.                       - Internal hemorrhoids.                       - The examination was otherwise normal. Recommendation:       - Await pathology results. Manya Silvas, MD 08/06/2016 8:58:56 AM This report has been signed electronically. Number of Addenda: 0 Note Initiated On: 08/06/2016 8:33 AM Scope Withdrawal Time: 0 hours 10 minutes 55 seconds  Total Procedure Duration: 0 hours 14 minutes 34 seconds       [facility]

## 2016-08-06 NOTE — Anesthesia Post-op Follow-up Note (Cosign Needed)
Anesthesia QCDR form completed.        

## 2016-08-06 NOTE — H&P (Signed)
Primary Care Physician:  [name], MD Primary Gastroenterologist:  Dr. Vira Agar  Pre-Procedure History & Physical: HPI:  Todd Potter is a 66 y.o. male is here for an colonoscopy.   Past Medical History:  Diagnosis Date  . Acanthosis nigricans   . CAD (coronary artery disease)   . Cardiomegaly   . Diabetes mellitus without complication ([place])   . History of adenomatous polyp of colon   . Hyperlipidemia   . Hypertension   . Knee pain   . Myocardial infarct [facility])     Past Surgical History:  Procedure Laterality Date  . CARDIAC CATHETERIZATION     Stent Placement x 5 to RCA   . CYSTECTOMY  1970&1990   x2 on tail bone    Prior to Admission medications   Medication Sig Start Date End Date Taking? Authorizing Provider  amLODipine (NORVASC) 10 MG tablet Take 1 tablet (10 mg total) by mouth daily. 03/03/16  Yes [name], MD  aspirin 325 MG tablet Take 325 mg by mouth daily. Pt on 3-year test for 325mg  ASA.   Yes Historical Provider, MD  atorvastatin (LIPITOR) 80 MG tablet TAKE 1 TABLET DAILY 05/15/16  Yes [name], MD  empagliflozin (JARDIANCE) 10 MG TABS tablet Take 10 mg by mouth daily. 08/05/16  Yes [name], MD  glipiZIDE (GLUCOTROL XL) 10 MG 24 hr tablet TAKE 1 TABLET DAILY 03/27/16  Yes [name], MD  hydrochlorothiazide (HYDRODIURIL) 25 MG tablet Take 1 tablet (25 mg total) by mouth daily. 03/03/16  Yes [name], MD  JANUMET 50-1000 MG tablet TAKE 1 TABLET TWICE DAILY  WITH MEALS 04/26/16  Yes [name], MD  metoprolol (TOPROL-XL) 200 MG 24 hr tablet Take 1 tablet (200 mg total) by mouth daily. 08/05/16  Yes [name], MD  sitaGLIPtin-metformin (JANUMET) 50-1000 MG tablet Take 1 tablet by mouth 2 (two) times daily. 03/03/16  Yes [name], MD  aspirin 81 MG tablet Take 1 tablet by mouth daily. 11/18/06   Historical Provider, MD  glucose blood (TRUETEST TEST) test strip TRUETEST TEST (In Vitro Strip)  Check blood sugar daily  for 0 days  Quantity: 100.00;  Refills: 12   Ordered :01-Mar-2011  [name] MD;  Started 25-September-2007 Active Comments: fishdona (09/25/2007) - With 100 lancets 09/25/07   Historical Provider, MD  lisinopril (PRINIVIL,ZESTRIL) 20 MG tablet Take 1 tablet (20 mg total) by mouth daily. Patient not taking: Reported on 08/06/2016 03/03/16   [name], MD  nitroGLYCERIN (NITROSTAT) 0.4 MG SL tablet Place 1 tablet under the tongue. 3-5 minutes x3 as nedded 11/14/09   Historical Provider, MD  sildenafil (VIAGRA) 100 MG tablet Take by mouth. 1/2-1 tablet as needed, no more than 1 a day 12/22/12   Historical Provider, MD    Allergies as of 06/11/2016  . (No Known Allergies)    History reviewed. No pertinent family history.  Social History   Social History  . Marital status: Married    Spouse name: N/A  . Number of children: 2  . Years of education: HS Grad   Occupational History  . Part-Time     Land   Social History Main Topics  . Smoking status: Former Smoker    Quit date: 04/19/2006  . Smokeless tobacco: Never Used  . Alcohol use 0.0 oz/week     Comment: drinks 1 beer monthly  . Drug use: No  . Sexual activity: Not on file  Other Topics Concern  . Not on file   Social History Narrative  . No narrative on file    Review of Systems: See HPI, otherwise negative ROS  Physical Exam: BP (!) 149/77   Pulse 73   Temp (!) 96.1 F (35.6 C) (Tympanic)   SpO2 98%  General:   Alert,  pleasant and cooperative in NAD Head:  Normocephalic and atraumatic. Neck:  Supple; no masses or thyromegaly. Lungs:  Clear throughout to auscultation.    Heart:  Regular rate and rhythm. Abdomen:  Soft, nontender and nondistended. Normal bowel sounds, without guarding, and without rebound.   Neurologic:  Alert and  oriented x4;  grossly normal neurologically.  Impression/Plan: Todd Potter is here for an colonoscopy to be performed for [facility] colon polyps  Risks,  benefits, limitations, and alternatives regarding  colonoscopy have been reviewed with the patient.  Questions have been answered.  All parties agreeable.   Gaylyn Cheers, MD  08/06/2016, 8:32 AM

## 2016-08-06 NOTE — Anesthesia Postprocedure Evaluation (Signed)
Anesthesia Post Note  Patient: Todd Potter  Procedure(s) Performed: Procedure(s) (LRB): COLONOSCOPY WITH PROPOFOL (N/A)  Patient location during evaluation: PACU Anesthesia Type: General Level of consciousness: awake and alert and oriented Pain management: pain level controlled Vital Signs Assessment: post-procedure vital signs reviewed and stable Respiratory status: spontaneous breathing Cardiovascular status: blood pressure returned to baseline Anesthetic complications: no     Last Vitals:  Vitals:   08/06/16 0929 08/06/16 0932  BP: 115/72 110/70  Pulse: 64 (!) 59  Resp: 17 20  Temp:      Last Pain:  Vitals:   08/06/16 0901  TempSrc: Oral                 Jonna Dittrich

## 2016-08-06 NOTE — Anesthesia Procedure Notes (Signed)
Date/Time: 08/06/2016 8:35 AM Performed by: Johnna Acosta Pre-anesthesia Checklist: Patient identified, Emergency Drugs available, Suction available, Patient being monitored and Timeout performed Patient Re-evaluated:Patient Re-evaluated prior to induction

## 2016-08-06 NOTE — Transfer of Care (Signed)
Immediate Anesthesia Transfer of Care Note  Patient: Todd Potter  Procedure(s) Performed: Procedure(s): COLONOSCOPY WITH PROPOFOL (N/A)  Patient Location: PACU  Anesthesia Type:General  Level of Consciousness: awake and alert   Airway & Oxygen Therapy: Patient Spontanous Breathing and Patient connected to nasal cannula oxygen  Post-op Assessment: Report given to RN and Post -op Vital signs reviewed and stable  Post vital signs: Reviewed and stable  Last Vitals:  Vitals:   08/06/16 0900 08/06/16 0901  BP:  (!) 110/53  Pulse: 66 66  Resp: (!) 9 12  Temp:  (!) 35.9 C    Last Pain:  Vitals:   08/06/16 0901  TempSrc: Oral         Complications: No apparent anesthesia complications

## 2016-08-09 ENCOUNTER — Encounter: Payer: Self-pay | Admitting: Unknown Physician Specialty

## 2016-08-09 LAB — SURGICAL PATHOLOGY

## 2016-08-12 LAB — HM DIABETES EYE EXAM

## 2016-10-12 ENCOUNTER — Other Ambulatory Visit: Payer: Self-pay

## 2016-10-14 ENCOUNTER — Ambulatory Visit (INDEPENDENT_AMBULATORY_CARE_PROVIDER_SITE_OTHER): Payer: Medicare Other | Admitting: Family Medicine

## 2016-10-14 ENCOUNTER — Encounter: Payer: Self-pay | Admitting: Family Medicine

## 2016-10-14 VITALS — BP 120/70 | HR 62 | Temp 97.5°F | Resp 16 | Wt 261.0 lb

## 2016-10-14 DIAGNOSIS — I2510 Atherosclerotic heart disease of native coronary artery without angina pectoris: Secondary | ICD-10-CM

## 2016-10-14 DIAGNOSIS — R040 Epistaxis: Secondary | ICD-10-CM

## 2016-10-14 NOTE — Progress Notes (Signed)
Patient: Todd Potter Male    DOB: 1951-02-21   66 y.o.   MRN: [id number] Visit Date: 10/14/2016  Today's Provider: Vernie Murders, PA   Chief Complaint  Patient presents with  . Epistaxis   Subjective:    Patient has been having nose bleeds for 2 months, several times a week. Bleeding occurs on right side with moderate stream that lasts about 3 minutes. Patient uses tissue until bleeding stops. No upper respiratory symptoms.     Epistaxis   The bleeding has been from the right nare. This is a new problem. The current episode started more than 1 month ago (2 months). The problem occurs every several days. The problem has been unchanged. The bleeding is associated with nothing. Treatments tried: tissue. The treatment provided significant relief.   Patient Active Problem List   Diagnosis Date Noted  . Morbid obesity ([place]) 02/07/2015  . Knee pain 02/06/2015  . History of adenomatous polyp of colon 02/06/2015  . Acanthosis nigricans 02/06/2015  . Hyperlipidemia, mixed 01/12/2011  . 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 Pure hypercholesterolemia 03/13/2007  . Essential hypertension 03/13/2007  . Personal history of tobacco use 02/08/2007  . Cardiac enlargement 10/18/2006  . Myocardial infarct, old 09/18/2006  . Controlled diabetes mellitus with renal manifestations ([place]) 09/18/2006  . CAD (coronary artery disease) 09/18/2006   Past Surgical History:  Procedure Laterality Date  . CARDIAC CATHETERIZATION     Stent Placement x 5 to RCA   . COLONOSCOPY WITH PROPOFOL N/A 08/06/2016   Procedure: COLONOSCOPY WITH PROPOFOL;  Surgeon: Manya Silvas, MD;  [facility]COPY;  Service: Endoscopy;  Laterality: N/A;  . CYSTECTOMY  1970&1990   x2 on tail bone   History reviewed. No pertinent family history.   No Known Allergies   Current Outpatient Prescriptions:  .  amLODipine (NORVASC) 10 MG tablet, Take 1 tablet (10 mg total) by mouth daily., Disp: 90 tablet,  Rfl: 4 .  aspirin 325 MG tablet, Take 325 mg by mouth daily. Pt on 3-year test for 325mg  ASA., Disp: , Rfl:  .  aspirin 81 MG tablet, Take 1 tablet by mouth daily., Disp: , Rfl:  .  atorvastatin (LIPITOR) 80 MG tablet, TAKE 1 TABLET DAILY, Disp: 90 tablet, Rfl: 3 .  dapagliflozin propanediol (FARXIGA) 5 MG TABS tablet, Take 5 mg by mouth daily., Disp: 30 tablet, Rfl: 3 .  empagliflozin (JARDIANCE) 10 MG TABS tablet, Take 10 mg by mouth daily., Disp: 30 tablet, Rfl: 3 .  glipiZIDE (GLUCOTROL XL) 10 MG 24 hr tablet, TAKE 1 TABLET DAILY, Disp: 90 tablet, Rfl: 3 .  glucose blood (TRUETEST TEST) test strip, TRUETEST TEST (In Vitro Strip)  Check blood sugar daily for 0 days  Quantity: 100.00;  Refills: 12   Ordered :01-Mar-2011  [name] MD;  Started 25-September-2007 Active Comments: fishdona (09/25/2007) - With 100 lancets, Disp: , Rfl:  .  hydrochlorothiazide (HYDRODIURIL) 25 MG tablet, Take 1 tablet (25 mg total) by mouth daily., Disp: 90 tablet, Rfl: 4 .  JANUMET 50-1000 MG tablet, TAKE 1 TABLET TWICE DAILY  WITH MEALS, Disp: 180 tablet, Rfl: 3 .  lisinopril (PRINIVIL,ZESTRIL) 20 MG tablet, Take 1 tablet (20 mg total) by mouth daily., Disp: 90 tablet, Rfl: 4 .  losartan (COZAAR) 50 MG tablet, TAKE 1 TABLET ONCE DAILY, Disp: , Rfl:  .  metoprolol (TOPROL-XL) 200 MG 24 hr tablet, Take 1 tablet (200 mg total) by mouth daily., Disp: 90  tablet, Rfl: 4 .  nitroGLYCERIN (NITROSTAT) 0.4 MG SL tablet, Place 1 tablet under the tongue. 3-5 minutes x3 as nedded, Disp: , Rfl:  .  sildenafil (VIAGRA) 100 MG tablet, Take by mouth. 1/2-1 tablet as needed, no more than 1 a day, Disp: , Rfl:   Review of Systems  Constitutional: Negative for appetite change, chills and fever.  HENT: Positive for nosebleeds.   Respiratory: Negative for chest tightness, shortness of breath and wheezing.   Cardiovascular: Negative for chest pain and palpitations.  Gastrointestinal: Negative for abdominal pain, nausea and vomiting.     Social History  Substance Use Topics  . Smoking status: Former Smoker    Quit date: 04/19/2006  . Smokeless tobacco: Never Used  . Alcohol use 0.0 oz/week     Comment: drinks 1 beer monthly   Objective:   BP 120/70 (BP Location: Right Arm, Patient Position: Sitting, Cuff Size: Large)   Pulse 62   Temp 97.5 F (36.4 C) (Oral)   Resp 16   Wt 261 lb (118.4 kg)   SpO2 95%   BMI 41.50 kg/m  Vitals:   10/14/16 0812  BP: 120/70  Pulse: 62  Resp: 16  Temp: 97.5 F (36.4 C)  TempSrc: Oral  SpO2: 95%  Weight: 261 lb (118.4 kg)   Physical Exam  Constitutional: He is oriented to person, place, and time. He appears well-developed and well-nourished. No distress.  HENT:  Head: Normocephalic and atraumatic.  Right Ear: Hearing normal.  Left Ear: Hearing normal.  Nose: Nose normal.  Mouth/Throat: Oropharynx is clear and moist.  No active bleeding from right nostril today.   Eyes: Conjunctivae and lids are normal. Right eye exhibits no discharge. Left eye exhibits no discharge. No scleral icterus.  Neck: Neck supple.  Cardiovascular: Normal rate.   Pulmonary/Chest: Effort normal and breath sounds normal. No respiratory distress.  Musculoskeletal: Normal range of motion.  Neurological: He is alert and oriented to person, place, and time.  Skin: Skin is intact. No lesion and no rash noted.  Psychiatric: He has a normal mood and affect. His speech is normal and behavior is normal. Thought content normal.      Assessment & Plan:     1. Epistaxis Onset over the past 3-4 days with short bouts in the evenings for 2-3 minutes. No active bleeding or bleeding sites visible in the right nostril where bleeding has been occurring. May use Afrin one spray each nostril BID for 3 days. Use a dust mask while mowing to prevent irritation. May use Saline spray during the day. Recheck prn.       Vernie Murders, PA  [facility]

## 2016-10-28 DIAGNOSIS — I2510 Atherosclerotic heart disease of native coronary artery without angina pectoris: Secondary | ICD-10-CM | POA: Diagnosis not present

## 2016-10-28 DIAGNOSIS — I10 Essential (primary) hypertension: Secondary | ICD-10-CM | POA: Diagnosis not present

## 2016-11-04 ENCOUNTER — Ambulatory Visit: Payer: Medicare Other | Admitting: Family Medicine

## 2016-11-17 DIAGNOSIS — R040 Epistaxis: Secondary | ICD-10-CM | POA: Diagnosis not present

## 2016-11-30 ENCOUNTER — Telehealth: Payer: Self-pay | Admitting: Family Medicine

## 2016-11-30 NOTE — Telephone Encounter (Signed)
Wendy at the [facility] ([place] office needs copy  pt. Last HgbA1c results and a letter stating what we are doing and pt is doing to keep DM within normal range.  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    Please fax to [phone] ATTN:  Abigail Butts

## 2016-12-02 NOTE — Telephone Encounter (Signed)
Patient needs to office visit for follow up of diabetes. His last A1c was out of range. We cannot send any records without him signing release form.

## 2016-12-03 ENCOUNTER — Encounter: Payer: Self-pay | Admitting: Family Medicine

## 2016-12-03 ENCOUNTER — Ambulatory Visit (INDEPENDENT_AMBULATORY_CARE_PROVIDER_SITE_OTHER): Payer: Medicare Other | Admitting: Family Medicine

## 2016-12-03 VITALS — BP 124/76 | HR 65 | Temp 97.9°F | Wt 259.6 lb

## 2016-12-03 DIAGNOSIS — E0821 Diabetes mellitus due to underlying condition with diabetic nephropathy: Secondary | ICD-10-CM

## 2016-12-03 DIAGNOSIS — I10 Essential (primary) hypertension: Secondary | ICD-10-CM | POA: Diagnosis not present

## 2016-12-03 DIAGNOSIS — E119 Type 2 diabetes mellitus without complications: Secondary | ICD-10-CM

## 2016-12-03 DIAGNOSIS — E7800 Pure hypercholesterolemia, unspecified: Secondary | ICD-10-CM | POA: Diagnosis not present

## 2016-12-03 DIAGNOSIS — I2510 Atherosclerotic heart disease of native coronary artery without angina pectoris: Secondary | ICD-10-CM | POA: Diagnosis not present

## 2016-12-03 LAB — POCT GLYCOSYLATED HEMOGLOBIN (HGB A1C): Hemoglobin A1C: 8.1

## 2016-12-03 LAB — POCT UA - MICROALBUMIN: Microalbumin Ur, POC: 20 mg/L

## 2016-12-03 MED ORDER — SITAGLIPTIN PHOS-METFORMIN HCL 50-1000 MG PO TABS: 1.0000 | ORAL_TABLET | Freq: Two times a day (BID) | ORAL | 0 refills | Status: DC

## 2016-12-03 MED ORDER — EMPAGLIFLOZIN 25 MG PO TABS: 25.0000 mg | ORAL_TABLET | Freq: Every day | ORAL | 5 refills | Status: DC

## 2016-12-03 NOTE — Progress Notes (Signed)
Patient: Todd Potter Male    DOB: [birth year]   66 y.o.   MRN: [id number] Visit Date: 12/03/2016  Today's Provider: [name], MD   Chief Complaint  Patient presents with  . Diabetes  . Hypertension  . Follow-up   Subjective:    HPI  Diabetes Mellitus Type II, Follow-up:   Lab Results  Component Value Date   HGBA1C 8.5 08/05/2016   HGBA1C 8.1 (H) 04/01/2016   HGBA1C 7.6 12/29/2015    Last seen for diabetes 4 months ago.  Management since then includes given samples of Jardiance 25 mg 1/2 tablet daily at last OV. Switched to [place] due to cost but this medication cost about the same as Jardiance. He reports good compliance with treatment. He is not having side effects.  Current symptoms include none  Home blood sugar records: patient started back checking FBS last week. This morning FBS was 124  Episodes of hypoglycemia? no   Current Insulin Regimen: none Most Recent Eye Exam: 07/2016 per patient. [facility] done [facility] Weight trend: stable Prior visit with dietician: no Current diet: well balanced Current exercise: light  walking  Dictation #1 [id number]  [id number]     Component Value Date/Time   CHOL 124 04/01/2016 1026   TRIG 95 04/01/2016 1026   HDL 34 (L) 04/01/2016 1026   LDLCALC 71 04/01/2016 1026   CREATININE 1.49 (H) 04/01/2016 1026    Wt Readings from Last 3 Encounters:  12/03/16 259 lb 9.6 oz (117.8 kg)  10/14/16 261 lb (118.4 kg)  08/05/16 261 lb (118.4 kg)    ------------------------------------------------------------------------  Hypertension, follow-up:  BP Readings from Last 3 Encounters:  12/03/16 124/76  10/14/16 120/70  08/06/16 110/70    He was last seen for hypertension 4 months ago.  BP at that visit was 110/70. Management changes since that visit include no change continue medications. He reports good compliance with treatment. He is not having side effects.  He is light walking for exercise. He is  adherent to low salt diet.   Outside blood pressures are being checked sometimes at local pharmacy. He is experiencing none.  Patient denies chest pain, chest pressure/discomfort, lower extremity edema and palpitations.   Cardiovascular risk factors include advanced age (older than 42 for men, 22 for women), diabetes mellitus, dyslipidemia, hypertension, male gender and obesity (BMI >= 30 kg/m2).      Weight trend: stable Wt Readings from Last 3 Encounters:  12/03/16 259 lb 9.6 oz (117.8 kg)  10/14/16 261 lb (118.4 kg)  08/05/16 261 lb (118.4 kg)    Current diet: well balanced  ------------------------------------------------------------------------    Previous Medications   AMLODIPINE (NORVASC) 10 MG TABLET    Take 1 tablet (10 mg total) by mouth daily.   ASPIRIN 81 MG TABLET    Take 1 tablet by mouth daily.   ATORVASTATIN (LIPITOR) 80 MG TABLET    TAKE 1 TABLET DAILY   DAPAGLIFLOZIN PROPANEDIOL (FARXIGA) 5 MG TABS TABLET    Take 5 mg by mouth daily.   EMPAGLIFLOZIN (JARDIANCE) 10 MG TABS TABLET    Take 10 mg by mouth daily.   GLIPIZIDE (GLUCOTROL XL) 10 MG 24 HR TABLET    TAKE 1 TABLET DAILY   GLUCOSE BLOOD (TRUETEST TEST) TEST STRIP    TRUETEST TEST (In Vitro Strip)  Check blood sugar daily for 0 days  Quantity: 100.00;  Refills: 12   Ordered :01-Mar-2011  [name] MD;  Started 25-September-2007 Active Comments: fishdona (09/25/2007) -  With 100 lancets   HYDROCHLOROTHIAZIDE (HYDRODIURIL) 25 MG TABLET    Take 1 tablet (25 mg total) by mouth daily.   LISINOPRIL (PRINIVIL,ZESTRIL) 20 MG TABLET    Take 1 tablet (20 mg total) by mouth daily.   LOSARTAN (COZAAR) 50 MG TABLET    TAKE 1 TABLET ONCE DAILY   METOPROLOL (TOPROL-XL) 200 MG 24 HR TABLET    Take 1 tablet (200 mg total) by mouth daily.   NITROGLYCERIN (NITROSTAT) 0.4 MG SL TABLET    Place 1 tablet under the tongue. 3-5 minutes x3 as nedded   SILDENAFIL (VIAGRA) 100 MG TABLET    Take by mouth. 1/2-1 tablet as needed, no  more than 1 a day    Review of Systems  Constitutional: Negative.   Respiratory: Negative.   Cardiovascular: Negative.   Endocrine: Negative.     Social History  Substance Use Topics  . Smoking status: Former Smoker    Quit date: 04/19/2006  . Smokeless tobacco: Never Used  . Alcohol use 0.0 oz/week     Comment: drinks 1 beer monthly   Objective:   BP 124/76 (BP Location: Right Arm, Patient Position: Sitting, Cuff Size: Normal)   Pulse 65   Temp 97.9 F (36.6 C) (Oral)   Wt 259 lb 9.6 oz (117.8 kg)   SpO2 95%   BMI 41.27 kg/m   Physical Exam   General Appearance:    Alert, cooperative, no distress  Eyes:    PERRL, conjunctiva/corneas clear, EOM's intact       Lungs:     Clear to auscultation bilaterally, respirations unlabored  Heart:    Regular rate and rhythm  Neurologic:   Awake, alert, oriented x 3. No apparent focal neurological           defect.        Results for orders placed or performed in visit on 12/03/16  POCT HgB A1C  Result Value Ref Range   Hemoglobin A1C 8.1   POCT UA - Microalbumin  Result Value Ref Range   Microalbumin Ur, POC 20 mg/L   Creatinine, POC na mg/dL   Albumin/Creatinine Ratio, Urine, POC na        Assessment & Plan:     1. Diabetes mellitus due to underlying condition, controlled, with diabetic nephropathy, without long-term current use of insulin ([place]) Doing well on Farxiga, but no cost advantage over [place]. Will change back to Jardiance and go up to 25mg  a day. Given a month of samples.  - POCT HgB A1C - POCT UA - Microalbumin - Renal function panel  2. Essential hypertension Well controlled.  Continue current medications.   - Renal function panel  3. Pure hypercholesterolemia He is tolerating atorvastatin well with no adverse effects.   - Renal function panel - Lipid panel  4. Coronary artery disease involving native coronary artery of native heart without angina pectoris Asymptomatic. Compliant with medication.   Continue aggressive risk factor modification.     Follow up: Return in about 3 months (around 03/05/2017).

## 2016-12-03 NOTE — Telephone Encounter (Signed)
Scheduled pt for f/u today at 11:30 am.

## 2016-12-04 LAB — LIPID PANEL
Chol/HDL Ratio: 4.3 ratio (ref 0.0–5.0)
Cholesterol, Total: 142 mg/dL (ref 100–199)
HDL: 33 mg/dL — ABNORMAL LOW (ref >39)
LDL Calculated: 90 mg/dL (ref 0–99)
Triglycerides: 94 mg/dL (ref 0–149)
VLDL Cholesterol Cal: 19 mg/dL (ref 5–40)

## 2016-12-04 LAB — RENAL FUNCTION PANEL
Albumin: 3.8 g/dL (ref 3.6–4.8)
BUN/Creatinine Ratio: 12 (ref 10–24)
BUN: 15 mg/dL (ref 8–27)
CO2: 26 mmol/L (ref 20–29)
Calcium: 9.6 mg/dL (ref 8.6–10.2)
Chloride: 96 mmol/L (ref 96–106)
Creatinine, Ser: 1.22 mg/dL (ref 0.76–1.27)
GFR calc Af Amer: 70 mL/min/{1.73_m2} (ref >59)
GFR calc non Af Amer: 61 mL/min/{1.73_m2} (ref >59)
Glucose: 139 mg/dL — ABNORMAL HIGH (ref 65–99)
Phosphorus: 4.3 mg/dL (ref 2.5–4.5)
Potassium: 3.7 mmol/L (ref 3.5–5.2)
Sodium: 138 mmol/L (ref 134–144)

## 2016-12-06 NOTE — Progress Notes (Signed)
Advised  ED 

## 2016-12-14 ENCOUNTER — Encounter: Payer: Self-pay | Admitting: Family Medicine

## 2017-01-03 ENCOUNTER — Telehealth: Payer: Self-pay | Admitting: *Deleted

## 2017-01-03 MED ORDER — SITAGLIPTIN PHOS-METFORMIN HCL 50-1000 MG PO TABS
1.0000 | ORAL_TABLET | Freq: Two times a day (BID) | ORAL | 0 refills | Status: DC
Start: 2017-01-03 — End: 2017-05-10

## 2017-01-03 NOTE — Telephone Encounter (Signed)
Samples given for Janumet 50-1000.

## 2017-01-14 ENCOUNTER — Encounter: Payer: Self-pay | Admitting: Family Medicine

## 2017-01-14 ENCOUNTER — Ambulatory Visit (INDEPENDENT_AMBULATORY_CARE_PROVIDER_SITE_OTHER): Payer: Medicare Other

## 2017-01-14 VITALS — Ht 67.0 in

## 2017-01-14 DIAGNOSIS — Z0000 Encounter for general adult medical examination without abnormal findings: Secondary | ICD-10-CM | POA: Diagnosis not present

## 2017-01-14 NOTE — Patient Instructions (Signed)
Mr. Todd Potter , Thank you for taking time to come for your Medicare Wellness Visit. I appreciate your ongoing commitment to your health goals. Please review the following plan we discussed and let me know if I can assist you in the future.   Screening recommendations/referrals: Colonoscopy: up to date Recommended yearly ophthalmology/optometry visit for glaucoma screening and checkup Recommended yearly dental visit for hygiene and checkup  Vaccinations: Influenza vaccine: declined Pneumococcal vaccine: Prevnar 13 completed, declined Pneumovax 23 today Tdap vaccine: up to date Shingles vaccine: declined  Advanced directives: Advance directive discussed with you today. Even though you declined this today please call our office should you change your mind and we can give you the proper paperwork for you to fill out.  Conditions/risks identified: Recommend eating 3 small deals a day with 2 healthy snacks in between. Also recommend cutting portion sizes in half.   Next appointment: 03/04/17  Preventive Care 65 Years and Older, Male Preventive care refers to lifestyle choices and visits with your health care provider that can promote health and wellness. What does preventive care include?  A yearly physical exam. This is also called an annual well check.  Dental exams once or twice a year.  Routine eye exams. Ask your health care provider how often you should have your eyes checked.  Personal lifestyle choices, including:  Daily care of your teeth and gums.  Regular physical activity.  Eating a healthy diet.  Avoiding tobacco and drug use.  Limiting alcohol use.  Practicing safe sex.  Taking low doses of aspirin every day.  Taking vitamin and mineral supplements as recommended by your health care provider. What happens during an annual well check? The services and screenings done by your health care provider during your annual well check will depend on your age, overall health,  lifestyle risk factors, and family history of disease. Counseling  Your health care provider may ask you questions about your:  Alcohol use.  Tobacco use.  Drug use.  Emotional well-being.  Home and relationship well-being.  Sexual activity.  Eating habits.  History of falls.  Memory and ability to understand (cognition).  Work and work Statistician. Screening  You may have the following tests or measurements:  Height, weight, and BMI.  Blood pressure.  Lipid and cholesterol levels. These may be checked every 5 years, or more frequently if you are over 55 years old.  Skin check.  Lung cancer screening. You may have this screening every year starting at age 4 if you have a 30-pack-year history of smoking and currently smoke or have quit within the past 15 years.  Fecal occult blood test (FOBT) of the stool. You may have this test every year starting at age 60.  Flexible sigmoidoscopy or colonoscopy. You may have a sigmoidoscopy every 5 years or a colonoscopy every 10 years starting at age 60.  Prostate cancer screening. Recommendations will vary depending on your family history and other risks.  Hepatitis C blood test.  Hepatitis B blood test.  Sexually transmitted disease (STD) testing.  Diabetes screening. This is done by checking your blood sugar (glucose) after you have not eaten for a while (fasting). You may have this done every 1-3 years.  Abdominal aortic aneurysm (AAA) screening. You may need this if you are a current or former smoker.  Osteoporosis. You may be screened starting at age 65 if you are at high risk. Talk with your health care provider about your test results, treatment options, and if necessary,  the need for more tests. Vaccines  Your health care provider may recommend certain vaccines, such as:  Influenza vaccine. This is recommended every year.  Tetanus, diphtheria, and acellular pertussis (Tdap, Td) vaccine. You may need a Td booster  every 10 years.  Zoster vaccine. You may need this after age 46.  Pneumococcal 13-valent conjugate (PCV13) vaccine. One dose is recommended after age 33.  Pneumococcal polysaccharide (PPSV23) vaccine. One dose is recommended after age 23. Talk to your health care provider about which screenings and vaccines you need and how often you need them. This information is not intended to replace advice given to you by your health care provider. Make sure you discuss any questions you have with your health care provider. Document Released: 05/02/2015 Document Revised: 12/24/2015 Document Reviewed: 02/04/2015 Elsevier Interactive Patient Education  2017 [place] Prevention in the Home Falls can cause injuries. They can happen to people of all ages. There are many things you can do to make your home safe and to help prevent falls. What can I do on the outside of my home?  Regularly fix the edges of walkways and driveways and fix any cracks.  Remove anything that might make you trip as you walk through a door, such as a raised step or threshold.  Trim any bushes or trees on the path to your home.  Use bright outdoor lighting.  Clear any walking paths of anything that might make someone trip, such as rocks or tools.  Regularly check to see if handrails are loose or broken. Make sure that both sides of any steps have handrails.  Any raised decks and porches should have guardrails on the edges.  Have any leaves, snow, or ice cleared regularly.  Use sand or salt on walking paths during winter.  Clean up any spills in your garage right away. This includes oil or grease spills. What can I do in the bathroom?  Use night lights.  Install grab bars by the toilet and in the tub and shower. Do not use towel bars as grab bars.  Use non-skid mats or decals in the tub or shower.  If you need to sit down in the shower, use a plastic, non-slip stool.  Keep the floor dry. Clean up any  water that spills on the floor as soon as it happens.  Remove soap buildup in the tub or shower regularly.  Attach bath mats securely with double-sided non-slip rug tape.  Do not have throw rugs and other things on the floor that can make you trip. What can I do in the bedroom?  Use night lights.  Make sure that you have a light by your bed that is easy to reach.  Do not use any sheets or blankets that are too big for your bed. They should not hang down onto the floor.  Have a firm chair that has side arms. You can use this for support while you get dressed.  Do not have throw rugs and other things on the floor that can make you trip. What can I do in the kitchen?  Clean up any spills right away.  Avoid walking on wet floors.  Keep items that you use a lot in easy-to-reach places.  If you need to reach something above you, use a strong step stool that has a grab bar.  Keep electrical cords out of the way.  Do not use floor polish or wax that makes floors slippery. If you must use  wax, use non-skid floor wax.  Do not have throw rugs and other things on the floor that can make you trip. What can I do with my stairs?  Do not leave any items on the stairs.  Make sure that there are handrails on both sides of the stairs and use them. Fix handrails that are broken or loose. Make sure that handrails are as long as the stairways.  Check any carpeting to make sure that it is firmly attached to the stairs. Fix any carpet that is loose or worn.  Avoid having throw rugs at the top or bottom of the stairs. If you do have throw rugs, attach them to the floor with carpet tape.  Make sure that you have a light switch at the top of the stairs and the bottom of the stairs. If you do not have them, ask someone to add them for you. What else can I do to help prevent falls?  Wear shoes that:  Do not have high heels.  Have rubber bottoms.  Are comfortable and fit you well.  Are closed  at the toe. Do not wear sandals.  If you use a stepladder:  Make sure that it is fully opened. Do not climb a closed stepladder.  Make sure that both sides of the stepladder are locked into place.  Ask someone to hold it for you, if possible.  Clearly mark and make sure that you can see:  Any grab bars or handrails.  First and last steps.  Where the edge of each step is.  Use tools that help you move around (mobility aids) if they are needed. These include:  Canes.  Walkers.  Scooters.  Crutches.  Turn on the lights when you go into a dark area. Replace any light bulbs as soon as they burn out.  Set up your furniture so you have a clear path. Avoid moving your furniture around.  If any of your floors are uneven, fix them.  If there are any pets around you, be aware of where they are.  Review your medicines with your doctor. Some medicines can make you feel dizzy. This can increase your chance of falling. Ask your doctor what other things that you can do to help prevent falls. This information is not intended to replace advice given to you by your health care provider. Make sure you discuss any questions you have with your health care provider. Document Released: 01/30/2009 Document Revised: 09/11/2015 Document Reviewed: 05/10/2014 Elsevier Interactive Patient Education  2017 [employer].

## 2017-01-14 NOTE — Progress Notes (Signed)
Subjective:   Todd Potter is a 66 y.o. male who presents for Medicare Annual/Subsequent preventive examination.  Review of Systems:  N/A  Cardiac Risk Factors include: advanced age (>66men, >74 women);diabetes mellitus;dyslipidemia;hypertension;male gender;obesity (BMI >30kg/m2)     Objective:    Vitals: Ht 5\' 7"  (1.702 m)   There is no height or weight on file to calculate BMI.  Tobacco History  Smoking Status  . Former Smoker  . Quit date: 04/19/2006  Smokeless Tobacco  . Never Used     Counseling given: Not Answered   Past Medical History:  Diagnosis Date  . Acanthosis nigricans   . CAD (coronary artery disease)   . Cardiomegaly   . Diabetes mellitus without complication ([place])   . History of adenomatous polyp of colon   . Hyperlipidemia   . Hypertension   . Knee pain   . Myocardial infarct [facility])    Past Surgical History:  Procedure Laterality Date  . CARDIAC CATHETERIZATION     Stent Placement x 5 to RCA   . COLONOSCOPY WITH PROPOFOL N/A 08/06/2016   Procedure: COLONOSCOPY WITH PROPOFOL;  Surgeon: Manya Silvas, MD;  Location[facility]OSCOPY;  Service: Endoscopy;  Laterality: N/A;  . CYSTECTOMY  1970&1990   x2 on tail bone   Family History  Problem Relation Age of Onset  . Diabetes Mother   . Diabetes Sister   . Anuerysm Brother   . Cancer Brother        brain  . Diabetes Brother    History  Sexual Activity  . Sexual activity: Not on file    Outpatient Encounter Prescriptions as of 01/14/2017  Medication Sig  . amLODipine (NORVASC) 10 MG tablet Take 1 tablet (10 mg total) by mouth daily.  Marland Kitchen aspirin 81 MG tablet Take 1 tablet by mouth daily.  Marland Kitchen atorvastatin (LIPITOR) 80 MG tablet TAKE 1 TABLET DAILY  . empagliflozin (JARDIANCE) 25 MG TABS tablet Take 25 mg by mouth daily.  Marland Kitchen glipiZIDE (GLUCOTROL XL) 10 MG 24 hr tablet TAKE 1 TABLET DAILY  . glucose blood (TRUETEST TEST) test strip TRUETEST TEST (In Vitro Strip)  Check blood sugar daily for 0  days  Quantity: 100.00;  Refills: 12   Ordered :01-Mar-2011  [name] MD;  Started 25-September-2007 Active Comments: fishdona (09/25/2007) - With 100 lancets  . hydrochlorothiazide (HYDRODIURIL) 25 MG tablet Take 1 tablet (25 mg total) by mouth daily.  Marland Kitchen lisinopril (PRINIVIL,ZESTRIL) 20 MG tablet Take 1 tablet (20 mg total) by mouth daily.  Marland Kitchen losartan (COZAAR) 50 MG tablet TAKE 1 TABLET ONCE DAILY  . metoprolol (TOPROL-XL) 200 MG 24 hr tablet Take 1 tablet (200 mg total) by mouth daily.  . nitroGLYCERIN (NITROSTAT) 0.4 MG SL tablet Place 1 tablet under the tongue. 3-5 minutes x3 as nedded  . sildenafil (VIAGRA) 100 MG tablet Take by mouth. 1/2-1 tablet as needed, no more than 1 a day  . sitaGLIPtin-metformin (JANUMET) 50-1000 MG tablet Take 1 tablet by mouth 2 (two) times daily.   No facility-administered encounter medications on file as of 01/14/2017.     Activities of Daily Living In your present state of health, do you have any difficulty performing the following activities: 01/14/2017 12/03/2016  Hearing? N N  Vision? N N  Difficulty concentrating or making decisions? N N  Walking or climbing stairs? N N  Dressing or bathing? N N  Doing errands, shopping? N N  Preparing Food and eating ? N -  Using the  Toilet? N -  In the past six months, have you accidently leaked urine? N -  Do you have problems with loss of bowel control? N -  Managing your Medications? N -  Managing your Finances? N -  Housekeeping or managing your Housekeeping? N -  Some recent data might be hidden    Patient Care Team: [name], MD as PCP - General (Family Medicine) Shirley Friar, MD (Cardiology) [facility]) [place], Raliegh Ip, NP as Nurse Practitioner (Cardiology)   Assessment:     Exercise Activities and Dietary recommendations Current Exercise Habits: The patient does not participate in regular exercise at present (does not exercise due to working all the time-  stays busy)  Goals    . Reduce portion size                 Fall Risk Fall Risk  01/14/2017 08/05/2016 06/26/2015 02/07/2015  Falls in the past year? No No No No   Depression Screen PHQ 2/9 Scores 01/14/2017 08/05/2016 06/26/2015 02/07/2015  PHQ - 2 Score 0 0 0 0  PHQ- 9 Score - 2 - 0    Cognitive Function-      6CIT Screen 01/14/2017  What Year? 0 points  What month? 0 points  What time? 0 points  Count back from 20 0 points  Months in reverse 0 points  Repeat phrase 2 points  Total Score 2    Immunization History  Administered Date(s) Administered  . Influenza-Unspecified 01/18/2015, 02/01/2016  . Pneumococcal Conjugate-13 02/07/2015  . Pneumococcal Polysaccharide-23 04/19/2010  . Td 10/25/1997  . Tdap 03/10/2012   Screening Tests Health Maintenance  Topic Date Due  . FOOT EXAM  05/08/1959  . PNA vac Low Risk Adult (2 of 2 - PPSV23) 02/07/2016  . INFLUENZA VACCINE  11/17/2016  . HEMOGLOBIN A1C  06/05/2017  . OPHTHALMOLOGY EXAM  08/12/2017  . COLONOSCOPY  08/06/2021  . TETANUS/TDAP  03/10/2022  . Hepatitis C Screening  Completed      Plan:  I have personally reviewed and addressed the Medicare Annual Wellness questionnaire and have noted the following in the patient's chart:  A. Medical and social history B. Use of alcohol, tobacco or illicit drugs  C. Current medications and supplements D. Functional ability and status E.  Nutritional status F.  Physical activity G. Advance directives H. List of other physicians I.  Hospitalizations, surgeries, and ER visits in previous 12 months J.  [place] such as hearing and vision if needed, cognitive and depression L. Referrals and appointments - none  In addition, I have reviewed and discussed with patient certain preventive protocols, quality metrics, and best practice recommendations. A written personalized care plan for preventive services as well as general preventive health recommendations were  provided to patient.  See attached scanned questionnaire for additional information.   Signed,  Fabio Neighbors, LPN Nurse Health Advisor   MD Recommendations: Pt needs a diabetic foot exam at next [place] on 03/04/17. Pt declined pneumovax 23 and influenza vaccine today. Pt to receive has flu vaccine at work this week.

## 2017-03-04 ENCOUNTER — Ambulatory Visit: Payer: Self-pay | Admitting: Family Medicine

## 2017-03-04 ENCOUNTER — Ambulatory Visit (INDEPENDENT_AMBULATORY_CARE_PROVIDER_SITE_OTHER): Payer: Medicare Other | Admitting: Family Medicine

## 2017-03-04 ENCOUNTER — Encounter: Payer: Self-pay | Admitting: Family Medicine

## 2017-03-04 VITALS — BP 132/80 | HR 70 | Temp 98.0°F | Resp 16 | Wt 262.0 lb

## 2017-03-04 DIAGNOSIS — I2510 Atherosclerotic heart disease of native coronary artery without angina pectoris: Secondary | ICD-10-CM

## 2017-03-04 DIAGNOSIS — I10 Essential (primary) hypertension: Secondary | ICD-10-CM

## 2017-03-04 DIAGNOSIS — E0821 Diabetes mellitus due to underlying condition with diabetic nephropathy: Secondary | ICD-10-CM

## 2017-03-04 DIAGNOSIS — E119 Type 2 diabetes mellitus without complications: Secondary | ICD-10-CM | POA: Diagnosis not present

## 2017-03-04 LAB — POCT GLYCOSYLATED HEMOGLOBIN (HGB A1C): Hemoglobin A1C: 7.5

## 2017-03-04 NOTE — Progress Notes (Deleted)
Patient: Todd Potter Male    DOB: 1950/06/27   66 y.o.   MRN: [id number] Visit Date: 03/04/2017  Today's Provider: [name], MD   No chief complaint on file.  Subjective:    HPI   Diabetes Mellitus Type II, Follow-up:   Lab Results  Component Value Date   HGBA1C 8.1 12/03/2016   HGBA1C 8.5 08/05/2016   HGBA1C 8.1 (H) 04/01/2016   Last seen for diabetes 3 months ago.  Management since then includes; changed back to Jardiance and increased up to 25 mg qd. Samples given. He reports {excellent/good/fair/poor:19665} compliance with treatment. He {ACTION; IS/IS QVZ:[id number]} having side effects. *** Current symptoms include {Symptoms; diabetes:14075} and have been {Desc; course:15616}. Home blood sugar records: {diabetes glucometry results:16657}  Episodes of hypoglycemia? {yes***/no:17258}   Current Insulin Regimen: *** Most Recent Eye Exam: *** Weight trend: {trend:16658} Prior visit with dietician: {yes/no:17258} Current diet: {diet habits:16563} Current exercise: {exercise types:16438}  ------------------------------------------------------------------------   Hypertension, follow-up:  BP Readings from Last 3 Encounters:  12/03/16 124/76  10/14/16 120/70  08/06/16 110/70    He was last seen for hypertension 3 months ago.  BP at that visit was 124/76. Management since that visit includes; no changes.He reports {excellent/good/fair/poor:19665} compliance with treatment. He {ACTION; IS/IS PPI:[id number]} having side effects. *** He {is/is not:9024} exercising. He {is/is not:9024} adherent to low salt diet.   Outside blood pressures are ***. He is experiencing {Symptoms; cardiac:12860}.  Patient denies {Symptoms; cardiac:12860}.   Cardiovascular risk factors include {cv risk factors:510}.  Use of agents associated with hypertension: {bp agents assoc with hypertension:511::"none"}.    ------------------------------------------------------------------------    Lipid/Cholesterol, Follow-up:   Last seen for this 3 months ago.  Management since that visit includes; labs checked, no changes.  Last Lipid Panel:    Component Value Date/Time   CHOL 142 12/03/2016 1221   TRIG 94 12/03/2016 1221   HDL 33 (L) 12/03/2016 1221   CHOLHDL 4.3 12/03/2016 1221   LDLCALC 90 12/03/2016 1221    He reports {excellent/good/fair/poor:19665} compliance with treatment. He {ACTION; IS/IS SAY:30160109} having side effects. ***  Wt Readings from Last 3 Encounters:  12/03/16 259 lb 9.6 oz (117.8 kg)  10/14/16 261 lb (118.4 kg)  08/05/16 261 lb (118.4 kg)    ------------------------------------------------------------------------  Coronary artery disease involving native coronary artery of native heart without angina pectoris From 12/03/2016-no changes.  No Known Allergies   Current Outpatient Medications:  .  amLODipine (NORVASC) 10 MG tablet, Take 1 tablet (10 mg total) by mouth daily., Disp: 90 tablet, Rfl: 4 .  aspirin 81 MG tablet, Take 1 tablet by mouth daily., Disp: , Rfl:  .  atorvastatin (LIPITOR) 80 MG tablet, TAKE 1 TABLET DAILY, Disp: 90 tablet, Rfl: 3 .  empagliflozin (JARDIANCE) 25 MG TABS tablet, Take 25 mg by mouth daily., Disp: 30 tablet, Rfl: 5 .  glipiZIDE (GLUCOTROL XL) 10 MG 24 hr tablet, TAKE 1 TABLET DAILY, Disp: 90 tablet, Rfl: 3 .  glucose blood (TRUETEST TEST) test strip, TRUETEST TEST (In Vitro Strip)  Check blood sugar daily for 0 days  Quantity: 100.00;  Refills: 12   Ordered :01-Mar-2011  [name] MD;  Started 25-September-2007 Active Comments: fishdona (09/25/2007) - With 100 lancets, Disp: , Rfl:  .  hydrochlorothiazide (HYDRODIURIL) 25 MG tablet, Take 1 tablet (25 mg total) by mouth daily., Disp: 90 tablet, Rfl: 4 .  lisinopril (PRINIVIL,ZESTRIL) 20 MG tablet, Take 1 tablet (20 mg total) by mouth daily., Disp:  90 tablet, Rfl: 4 .  losartan  (COZAAR) 50 MG tablet, TAKE 1 TABLET ONCE DAILY, Disp: , Rfl:  .  metoprolol (TOPROL-XL) 200 MG 24 hr tablet, Take 1 tablet (200 mg total) by mouth daily., Disp: 90 tablet, Rfl: 4 .  nitroGLYCERIN (NITROSTAT) 0.4 MG SL tablet, Place 1 tablet under the tongue. 3-5 minutes x3 as nedded, Disp: , Rfl:  .  sildenafil (VIAGRA) 100 MG tablet, Take by mouth. 1/2-1 tablet as needed, no more than 1 a day, Disp: , Rfl:  .  sitaGLIPtin-metformin (JANUMET) 50-1000 MG tablet, Take 1 tablet by mouth 2 (two) times daily., Disp: 28 tablet, Rfl: 0  Review of Systems  Constitutional: Negative for appetite change, chills and fever.  Respiratory: Negative for chest tightness, shortness of breath and wheezing.   Cardiovascular: Negative for chest pain and palpitations.  Gastrointestinal: Negative for abdominal pain, nausea and vomiting.    Social History   Tobacco Use  . Smoking status: Former Smoker    Last attempt to quit: 04/19/2006    Years since quitting: 10.8  . Smokeless tobacco: Never Used  Substance Use Topics  . Alcohol use: Yes    Alcohol/week: 0.0 oz    Comment: drinks 1-2 beer monthly   Objective:   There were no vitals taken for this visit. There were no vitals filed for this visit.   Physical Exam      Assessment & Plan:           [name], MD  [facility]

## 2017-03-04 NOTE — Progress Notes (Signed)
Patient: Todd Potter Male    DOB: 31-May-1950   66 y.o.   MRN: [id number] Visit Date: 03/04/2017  Today's Provider: [name], MD   Chief Complaint  Patient presents with  . Diabetes  . Hypertension  . Hyperlipidemia   Subjective:    HPI   Diabetes Mellitus Type II, Follow-up:   Lab Results  Component Value Date   HGBA1C 8.1 12/03/2016   HGBA1C 8.5 08/05/2016   HGBA1C 8.1 (H) 04/01/2016   Last seen for diabetes 3 months ago.  Management since then includes; changed back to Jardiance and increased it up to 25 mg qd.  He is not having side effects.  Current symptoms include hyperglycemia and have been unchanged. Home blood sugar records: 200's  Episodes of hypoglycemia? no   Current Insulin Regimen: n/a Most Recent Eye Exam: 07/2016 Weight trend: stable Current exercise: none  ----------------------------------------------------------------   Hypertension, follow-up:  BP Readings from Last 3 Encounters:  03/04/17 132/80  12/03/16 124/76  10/14/16 120/70    He was last seen for hypertension 3 months ago.  BP at that visit was 124/76. Management since that visit includes; no changes.He reports good compliance with treatment. He is not having side effects.  He is not exercising. He is adherent to low salt diet.   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 He is experiencing none.  Patient denies chest pain, chest pressure/discomfort, claudication, dyspnea, exertional chest pressure/discomfort, fatigue, irregular heart beat, lower extremity edema, near-syncope, orthopnea, palpitations, paroxysmal nocturnal dyspnea, syncope and tachypnea.   Cardiovascular risk factors include advanced age (older than 23 for men, 84 for women), diabetes mellitus, dyslipidemia, hypertension, male gender, obesity (BMI >= 30 kg/m2) and smoking/ tobacco exposure.    ----------------------------------------------------------------    Lipid/Cholesterol, Follow-up:   Last seen for this 3 months ago.  Management since that visit includes; labs checked, no changes.  Last Lipid Panel:    Component Value Date/Time   CHOL 142 12/03/2016 1221   TRIG 94 12/03/2016 1221   HDL 33 (L) 12/03/2016 1221   CHOLHDL 4.3 12/03/2016 1221   LDLCALC 90 12/03/2016 1221    He reports good compliance with treatment. He is not having side effects.   Wt Readings from Last 3 Encounters:  03/04/17 262 lb (118.8 kg)  12/03/16 259 lb 9.6 oz (117.8 kg)  10/14/16 261 lb (118.4 kg)    ----------------------------------------------------------------  Coronary artery disease involving native coronary artery of native heart without angina pectoris From 12/03/2016-no changes.  No Known Allergies   Current Outpatient Medications:  .  aspirin 81 MG tablet, Take 1 tablet by mouth daily., Disp: , Rfl:  .  atorvastatin (LIPITOR) 80 MG tablet, TAKE 1 TABLET DAILY, Disp: 90 tablet, Rfl: 3 .  empagliflozin (JARDIANCE) 25 MG TABS tablet, Take 25 mg by mouth daily., Disp: 30 tablet, Rfl: 5 .  glipiZIDE (GLUCOTROL XL) 10 MG 24 hr tablet, TAKE 1 TABLET DAILY, Disp: 90 tablet, Rfl: 3 .  hydrochlorothiazide (HYDRODIURIL) 25 MG tablet, Take 1 tablet (25 mg total) by mouth daily., Disp: 90 tablet, Rfl: 4 .  lisinopril (PRINIVIL,ZESTRIL) 20 MG tablet, Take 1 tablet (20 mg total) by mouth daily., Disp: 90 tablet, Rfl: 4 .  losartan (COZAAR) 50 MG tablet, TAKE 1 TABLET ONCE DAILY, Disp: , Rfl:  .  nitroGLYCERIN (NITROSTAT) 0.4 MG SL tablet, Place 1 tablet under the tongue. 3-5 minutes x3 as nedded, Disp: , Rfl:  .  sildenafil (VIAGRA)  100 MG tablet, Take by mouth. 1/2-1 tablet as needed, no more than 1 a day, Disp: , Rfl:  .  sitaGLIPtin-metformin (JANUMET) 50-1000 MG tablet, Take 1 tablet by mouth 2 (two) times daily., Disp: 28 tablet, Rfl: 0 .  amLODipine (NORVASC) 10 MG tablet, Take  1 tablet (10 mg total) by mouth daily. (Patient not taking: Reported on 03/04/2017), Disp: 90 tablet, Rfl: 4 .  glucose blood (TRUETEST TEST) test strip, TRUETEST TEST (In Vitro Strip)  Check blood sugar daily for 0 days  Quantity: 100.00;  Refills: 12   Ordered :01-Mar-2011  [name] MD;  Started 25-September-2007 Active Comments: fishdona (09/25/2007) - With 100 lancets, Disp: , Rfl:  .  metoprolol (TOPROL-XL) 200 MG 24 hr tablet, Take 1 tablet (200 mg total) by mouth daily., Disp: 90 tablet, Rfl: 4  Review of Systems  Constitutional: Negative for appetite change, chills and fever.  Respiratory: Negative for chest tightness, shortness of breath and wheezing.   Cardiovascular: Negative for chest pain and palpitations.  Gastrointestinal: Negative for abdominal pain, nausea and vomiting.    Social History   Tobacco Use  . Smoking status: Former Smoker    Last attempt to quit: 04/19/2006    Years since quitting: 10.8  . Smokeless tobacco: Never Used  Substance Use Topics  . Alcohol use: Yes    Alcohol/week: 0.0 oz    Comment: drinks 1-2 beer monthly   Objective:   BP 132/80 (BP Location: Right Arm, Patient Position: Sitting, Cuff Size: Large)   Pulse 70   Temp 98 F (36.7 C) (Oral)   Resp 16   Wt 262 lb (118.8 kg)   SpO2 97%   BMI 41.04 kg/m  Vitals:   03/04/17 1601  BP: 132/80  Pulse: 70  Resp: 16  Temp: 98 F (36.7 C)  TempSrc: Oral  SpO2: 97%  Weight: 262 lb (118.8 kg)     Physical Exam   General Appearance:    Alert, cooperative, no distress, obese  Eyes:    PERRL, conjunctiva/corneas clear, EOM's intact       Lungs:     Clear to auscultation bilaterally, respirations unlabored  Heart:    Regular rate and rhythm  Neurologic:   Awake, alert, oriented x 3. No apparent focal neurological           defect.         Results for orders placed or performed in visit on 03/04/17  POCT HgB A1C  Result Value Ref Range   Hemoglobin A1C 7.5    [    Assessment &  Plan:     1. Diabetes mellitus due to underlying condition, controlled, with diabetic nephropathy, without long-term current use of insulin ([place]) Doing well on current medications, given 3 weeks Jardiance 25mg  samples, and 5 week Janumet XR 50-1000mg  samples.  - POCT HgB A1C  2. Hypertension Well controlled.  Continue current medications.  \  Return in about 4 months (around 07/02/2017).       [name], MD  [facility]

## 2017-04-20 ENCOUNTER — Other Ambulatory Visit: Payer: Self-pay | Admitting: Family Medicine

## 2017-05-10 ENCOUNTER — Other Ambulatory Visit: Payer: Self-pay | Admitting: Family Medicine

## 2017-05-10 MED ORDER — SITAGLIPTIN PHOS-METFORMIN HCL 50-1000 MG PO TABS: 1.0000 | ORAL_TABLET | Freq: Two times a day (BID) | ORAL | 4 refills | Status: DC

## 2017-05-10 MED ORDER — EMPAGLIFLOZIN 25 MG PO TABS: 25.0000 mg | ORAL_TABLET | Freq: Every day | ORAL | 4 refills | Status: DC

## 2017-05-10 NOTE — Telephone Encounter (Signed)
Patient is requesting a refill on the following medications  empagliflozin (JARDIANCE) 25 MG TABS tablet, 90 day supply  sitaGLIPtin-metformin (JANUMET) 50-1000 MG tablet, 90 day supply  He uses [facility]

## 2017-05-12 ENCOUNTER — Other Ambulatory Visit: Payer: Self-pay | Admitting: Family Medicine

## 2017-05-19 DIAGNOSIS — E782 Mixed hyperlipidemia: Secondary | ICD-10-CM | POA: Diagnosis not present

## 2017-05-19 DIAGNOSIS — I2510 Atherosclerotic heart disease of native coronary artery without angina pectoris: Secondary | ICD-10-CM | POA: Diagnosis not present

## 2017-05-19 DIAGNOSIS — I10 Essential (primary) hypertension: Secondary | ICD-10-CM | POA: Diagnosis not present

## 2017-06-13 ENCOUNTER — Other Ambulatory Visit: Payer: Self-pay | Admitting: Family Medicine

## 2017-06-23 ENCOUNTER — Other Ambulatory Visit: Payer: Self-pay | Admitting: Family Medicine

## 2017-07-08 ENCOUNTER — Ambulatory Visit: Payer: Medicare Other | Admitting: Family Medicine

## 2017-07-08 NOTE — Progress Notes (Deleted)
Patient: Todd Potter Male    DOB: [birth year]   67 y.o.   MRN: [id number] Visit Date: 07/08/2017  Today's Provider: [name], MD   No chief complaint on file.  Subjective:    HPI   Diabetes Mellitus Type II, Follow-up:   Lab Results  Component Value Date   HGBA1C 7.5 03/04/2017   HGBA1C 8.1 12/03/2016   HGBA1C 8.5 08/05/2016   Last seen for diabetes 4 months ago.  Management since then includes; no changes. He reports {excellent/good/fair/poor:19665} compliance with treatment. He {ACTION; IS/IS ION:62952841} having side effects. *** Current symptoms include {Symptoms; diabetes:14075} and have been {Desc; course:15616}. Home blood sugar records: {diabetes glucometry results:16657}  Episodes of hypoglycemia? {yes***/no:17258}   Current Insulin Regimen: *** Most Recent Eye Exam: *** Weight trend: {trend:16658} Prior visit with dietician: {yes/no:17258} Current diet: {diet habits:16563} Current exercise: {exercise types:16438}  ------------------------------------------------------------------------   Hypertension, follow-up:  BP Readings from Last 3 Encounters:  03/04/17 132/80  12/03/16 124/76  10/14/16 120/70    He was last seen for hypertension 4 months ago.  BP at that visit was 132/80. Management since that visit includes; no changes.He reports {excellent/good/fair/poor:19665} compliance with treatment. He {ACTION; IS/IS LKG:40102725} having side effects. *** He {is/is not:9024} exercising. He {is/is not:9024} adherent to low salt diet.   Outside blood pressures are ***. He is experiencing {Symptoms; cardiac:12860}.  Patient denies {Symptoms; cardiac:12860}.   Cardiovascular risk factors include {cv risk factors:510}.  Use of agents associated with hypertension: {bp agents assoc with hypertension:511::"none"}.   ------------------------------------------------------------------------    No Known Allergies   Current Outpatient  Medications:  .  amLODipine (NORVASC) 10 MG tablet, Take 1 tablet (10 mg total) by mouth daily. (Patient not taking: Reported on 03/04/2017), Disp: 90 tablet, Rfl: 4 .  aspirin 81 MG tablet, Take 1 tablet by mouth daily., Disp: , Rfl:  .  atorvastatin (LIPITOR) 80 MG tablet, TAKE 1 TABLET DAILY, Disp: 90 tablet, Rfl: 4 .  empagliflozin (JARDIANCE) 25 MG TABS tablet, Take 25 mg by mouth daily., Disp: 90 tablet, Rfl: 4 .  glipiZIDE (GLUCOTROL XL) 10 MG 24 hr tablet, TAKE 1 TABLET DAILY, Disp: 90 tablet, Rfl: 3 .  glucose blood (TRUETEST TEST) test strip, TRUETEST TEST (In Vitro Strip)  Check blood sugar daily for 0 days  Quantity: 100.00;  Refills: 12   Ordered :01-Mar-2011  [name] MD;  Started 25-September-2007 Active Comments: fishdona (09/25/2007) - With 100 lancets, Disp: , Rfl:  .  hydrochlorothiazide (HYDRODIURIL) 25 MG tablet, Take 1 tablet (25 mg total) by mouth daily., Disp: 90 tablet, Rfl: 4 .  JANUMET 50-1000 MG tablet, TAKE 1 TABLET TWICE DAILY  WITH MEALS, Disp: 180 tablet, Rfl: 3 .  lisinopril (PRINIVIL,ZESTRIL) 20 MG tablet, Take 1 tablet (20 mg total) by mouth daily., Disp: 90 tablet, Rfl: 4 .  losartan (COZAAR) 50 MG tablet, TAKE 1 TABLET ONCE DAILY, Disp: , Rfl:  .  metoprolol (TOPROL-XL) 200 MG 24 hr tablet, TAKE 1 TABLET DAILY, Disp: 90 tablet, Rfl: 4 .  nitroGLYCERIN (NITROSTAT) 0.4 MG SL tablet, Place 1 tablet under the tongue. 3-5 minutes x3 as nedded, Disp: , Rfl:  .  sildenafil (VIAGRA) 100 MG tablet, Take by mouth. 1/2-1 tablet as needed, no more than 1 a day, Disp: , Rfl:   Review of Systems  Constitutional: Negative for appetite change, chills and fever.  Respiratory: Negative for chest tightness, shortness of breath and wheezing.   Cardiovascular: Negative for chest  pain and palpitations.  Gastrointestinal: Negative for abdominal pain, nausea and vomiting.    Social History   Tobacco Use  . Smoking status: Former Smoker    Last attempt to quit: 04/19/2006     Years since quitting: 11.2  . Smokeless tobacco: Never Used  Substance Use Topics  . Alcohol use: Yes    Alcohol/week: 0.0 oz    Comment: drinks 1-2 beer monthly   Objective:   There were no vitals taken for this visit. There were no vitals filed for this visit.   Physical Exam      Assessment & Plan:           [name], MD  [facility]

## 2017-08-18 DIAGNOSIS — E119 Type 2 diabetes mellitus without complications: Secondary | ICD-10-CM | POA: Diagnosis not present

## 2017-10-21 ENCOUNTER — Ambulatory Visit (INDEPENDENT_AMBULATORY_CARE_PROVIDER_SITE_OTHER): Payer: Medicare Other | Admitting: Family Medicine

## 2017-10-21 ENCOUNTER — Encounter: Payer: Self-pay | Admitting: Family Medicine

## 2017-10-21 VITALS — BP 130/78 | HR 68 | Temp 98.4°F | Resp 16 | Wt 248.0 lb

## 2017-10-21 DIAGNOSIS — IMO0001 Reserved for inherently not codable concepts without codable children: Secondary | ICD-10-CM

## 2017-10-21 DIAGNOSIS — E1165 Type 2 diabetes mellitus with hyperglycemia: Secondary | ICD-10-CM

## 2017-10-21 DIAGNOSIS — I10 Essential (primary) hypertension: Secondary | ICD-10-CM

## 2017-10-21 LAB — POCT GLYCOSYLATED HEMOGLOBIN (HGB A1C)
Est. average glucose Bld gHb Est-mCnc: 197
Hemoglobin A1C: 8.5 % — AB (ref 4.0–5.6)

## 2017-10-21 MED ORDER — METFORMIN HCL 1000 MG PO TABS: 1000.0000 mg | ORAL_TABLET | Freq: Two times a day (BID) | ORAL | 3 refills | Status: DC

## 2017-10-21 MED ORDER — SITAGLIPTIN PHOSPHATE 100 MG PO TABS: 100.0000 mg | ORAL_TABLET | Freq: Every day | ORAL | 3 refills | Status: DC

## 2017-10-21 NOTE — Progress Notes (Signed)
Patient: Todd Potter Male    DOB: December 06, 1950   67 y.o.   MRN: [id number] Visit Date: 10/21/2017  Today's Provider: [name], MD   Chief Complaint  Patient presents with  . Diabetes  . Hypertension   Subjective:    HPI  Diabetes Mellitus Type II, Follow-up:   Lab Results  Component Value Date   HGBA1C 7.5 03/04/2017   HGBA1C 8.1 12/03/2016   HGBA1C 8.5 08/05/2016    Last seen for diabetes 8 months ago.  Management since then includes no changes. He reports fair compliance with treatment. He states he ran out of Janumet for a few weeks, and has been consuming more starchy foods like white pasta and potatoes. He also has been drinking more sweet tea during the summer months.  He is not having side effects.  Current symptoms include none and have been stable. Home blood sugar records: blood sugars are not checked at home  Episodes of hypoglycemia? no   Current Insulin Regimen: none Most Recent Eye Exam: 07/2017 Weight trend: decreasing steadily Prior visit with dietician: no Current diet: well balanced Current exercise: none  Pertinent Labs:    Component Value Date/Time   CHOL 142 12/03/2016 1221   TRIG 94 12/03/2016 1221   HDL 33 (L) 12/03/2016 1221   LDLCALC 90 12/03/2016 1221   CREATININE 1.22 12/03/2016 1221    Wt Readings from Last 3 Encounters:  10/21/17 248 lb (112.5 kg)  03/04/17 262 lb (118.8 kg)  12/03/16 259 lb 9.6 oz (117.8 kg)    ------------------------------------------------------------------------  Hypertension, follow-up:  BP Readings from Last 3 Encounters:  10/21/17 130/78  03/04/17 132/80  12/03/16 124/76    He was last seen for hypertension 8 months ago.  BP at that visit was 132/80c. Management since that visit includes no changes. He reports good compliance with treatment. He is not having side effects.  He is not exercising. He is adherent to low salt diet.   Outside blood pressures are not being  checked. He is experiencing none.  Patient denies chest pain, chest pressure/discomfort, claudication, dyspnea, exertional chest pressure/discomfort, fatigue, irregular heart beat, lower extremity edema, near-syncope, orthopnea, palpitations, paroxysmal nocturnal dyspnea, syncope and tachypnea.   Cardiovascular risk factors include advanced age (older than 32 for men, 10 for women), diabetes mellitus, hypertension and male gender.  Use of agents associated with hypertension: NSAIDS.     Weight trend: decreasing steadily Wt Readings from Last 3 Encounters:  10/21/17 248 lb (112.5 kg)  03/04/17 262 lb (118.8 kg)  12/03/16 259 lb 9.6 oz (117.8 kg)    Current diet: well balanced  ------------------------------------------------------------------------     No Known Allergies   Current Outpatient Medications:  .  amLODipine (NORVASC) 10 MG tablet, Take 1 tablet (10 mg total) by mouth daily., Disp: 90 tablet, Rfl: 4 .  aspirin 81 MG tablet, Take 1 tablet by mouth daily., Disp: , Rfl:  .  atorvastatin (LIPITOR) 80 MG tablet, TAKE 1 TABLET DAILY, Disp: 90 tablet, Rfl: 4 .  empagliflozin (JARDIANCE) 25 MG TABS tablet, Take 25 mg by mouth daily., Disp: 90 tablet, Rfl: 4 .  glipiZIDE (GLUCOTROL XL) 10 MG 24 hr tablet, TAKE 1 TABLET DAILY, Disp: 90 tablet, Rfl: 3 .  glucose blood (TRUETEST TEST) test strip, TRUETEST TEST (In Vitro Strip)  Check blood sugar daily for 0 days  Quantity: 100.00;  Refills: 12   Ordered :01-Mar-2011  [name] MD;  Started 25-September-2007 Active Comments:  fishdona (09/25/2007) - With 100 lancets, Disp: , Rfl:  .  hydrochlorothiazide (HYDRODIURIL) 25 MG tablet, Take 1 tablet (25 mg total) by mouth daily., Disp: 90 tablet, Rfl: 4 .  JANUMET 50-1000 MG tablet, TAKE 1 TABLET TWICE DAILY  WITH MEALS, Disp: 180 tablet, Rfl: 3 .  lisinopril (PRINIVIL,ZESTRIL) 20 MG tablet, Take 1 tablet (20 mg total) by mouth daily., Disp: 90 tablet, Rfl: 4 .  losartan (COZAAR) 50 MG tablet,  TAKE 1 TABLET ONCE DAILY, Disp: , Rfl:  .  metoprolol (TOPROL-XL) 200 MG 24 hr tablet, TAKE 1 TABLET DAILY, Disp: 90 tablet, Rfl: 4 .  nitroGLYCERIN (NITROSTAT) 0.4 MG SL tablet, Place 1 tablet under the tongue. 3-5 minutes x3 as nedded, Disp: , Rfl:  .  sildenafil (VIAGRA) 100 MG tablet, Take by mouth. 1/2-1 tablet as needed, no more than 1 a day, Disp: , Rfl:   Review of Systems  Constitutional: Negative for appetite change, chills and fever.  Respiratory: Negative for chest tightness, shortness of breath and wheezing.   Cardiovascular: Negative for chest pain and palpitations.  Gastrointestinal: Negative for abdominal pain, nausea and vomiting.    Social History   Tobacco Use  . Smoking status: Former Smoker    Last attempt to quit: 04/19/2006    Years since quitting: 11.5  . Smokeless tobacco: Never Used  Substance Use Topics  . Alcohol use: Yes    Alcohol/week: 0.0 oz    Comment: drinks 1-2 beer monthly   Objective:   BP 130/78 (BP Location: Left Arm, Patient Position: Sitting, Cuff Size: Large)   Pulse 68   Temp 98.4 F (36.9 C) (Oral)   Resp 16   Wt 248 lb (112.5 kg)   SpO2 95% Comment: room air  BMI 38.84 kg/m  Vitals:   10/21/17 1347  BP: 130/78  Pulse: 68  Resp: 16  Temp: 98.4 F (36.9 C)  TempSrc: Oral  SpO2: 95%  Weight: 248 lb (112.5 kg)     Physical Exam  General Appearance:    Alert, cooperative, no distress, obese  Eyes:    PERRL, conjunctiva/corneas clear, EOM's intact       Lungs:     Clear to auscultation bilaterally, respirations unlabored  Heart:    Regular rate and rhythm  Neurologic:   Awake, alert, oriented x 3. No apparent focal neurological           defect.       Results for orders placed or performed in visit on 10/21/17  POCT HgB A1C  Result Value Ref Range   Hemoglobin A1C 8.5 (A) 4.0 - 5.6 %   HbA1c POC (<> result, manual entry)  4.0 - 5.6 %   HbA1c, POC (prediabetic range)  5.7 - 6.4 %   HbA1c, POC (controlled diabetic range)   0.0 - 7.0 %   Est. average glucose Bld gHb Est-mCnc 197        Assessment & Plan:     1. Uncontrolled type 2 diabetes mellitus without complication, without long-term current use of insulin (HCC) A1c is up today, partially due to running out of Janumet (due to cost) and getting more high glycemic foods in diet. Counseled on dietary improvements. Try change Janumet to Januvia plus metformin to see if it is more affordable.  - POCT HgB A1C  2. Morbid obesity ([place]) Counseled to improve diet, has lost 14 pounds since last visit.   3. Essential hypertension Well controlled.  Continue current medications.  [name], MD  [facility]

## 2017-10-21 NOTE — Patient Instructions (Signed)
Avoid sweetened drinks, sugars, and starchy foods.

## 2017-11-02 DIAGNOSIS — I2510 Atherosclerotic heart disease of native coronary artery without angina pectoris: Secondary | ICD-10-CM | POA: Diagnosis not present

## 2017-11-02 DIAGNOSIS — I10 Essential (primary) hypertension: Secondary | ICD-10-CM | POA: Diagnosis not present

## 2017-11-02 DIAGNOSIS — E782 Mixed hyperlipidemia: Secondary | ICD-10-CM | POA: Diagnosis not present

## 2017-11-28 ENCOUNTER — Telehealth: Payer: Self-pay

## 2017-11-28 DIAGNOSIS — E0821 Diabetes mellitus due to underlying condition with diabetic nephropathy: Secondary | ICD-10-CM

## 2017-11-28 MED ORDER — SITAGLIPTIN-METFORMIN HCL 50-1000 MG PO TABS
1.0000 | ORAL_TABLET | Freq: Two times a day (BID) | ORAL | 0 refills | Status: DC
Start: 2017-11-28 — End: 2018-06-16

## 2017-11-28 NOTE — Telephone Encounter (Signed)
Patient request samples of Janumet 50/1000mg . OK per Dr. Caryn Section. Samples dispensed.

## 2017-12-21 ENCOUNTER — Encounter: Payer: Self-pay | Admitting: Podiatry

## 2017-12-21 ENCOUNTER — Ambulatory Visit (INDEPENDENT_AMBULATORY_CARE_PROVIDER_SITE_OTHER): Payer: Medicare Other

## 2017-12-21 ENCOUNTER — Ambulatory Visit (INDEPENDENT_AMBULATORY_CARE_PROVIDER_SITE_OTHER): Payer: Medicare Other | Admitting: Podiatry

## 2017-12-21 VITALS — BP 131/76 | HR 68 | Resp 16

## 2017-12-21 DIAGNOSIS — M778 Other enthesopathies, not elsewhere classified: Secondary | ICD-10-CM

## 2017-12-21 DIAGNOSIS — M779 Enthesopathy, unspecified: Secondary | ICD-10-CM

## 2017-12-21 NOTE — Progress Notes (Signed)
Subjective:  Patient ID: Todd Potter, male    DOB: 03-09-1951,  MRN: [id number] HPI Chief Complaint  Patient presents with  . Foot Pain    1st MPJ left - aching, swelling, redness x 1 week, always had the deformity, but never hurt like this before, soaking in epsom, better than it was  . New Patient (Initial Visit)    67 y.o. male presents with the above complaint.  ROS: Denies fever chills nausea vomiting muscle aches pains calf pain back pain chest pain shortness of breath.  Past Medical History:  Diagnosis Date  . Acanthosis nigricans   . CAD (coronary artery disease)   . Cardiomegaly   . Diabetes mellitus without complication ([place])   . History of adenomatous polyp of colon   . Hyperlipidemia   . Hypertension   . Knee pain   . Myocardial infarct [facility])    Past Surgical History:  Procedure Laterality Date  . CARDIAC CATHETERIZATION     Stent Placement x 5 to RCA   . COLONOSCOPY WITH PROPOFOL N/A 08/06/2016   Procedure: COLONOSCOPY WITH PROPOFOL;  Surgeon: Manya Silvas, MD;  Location[facility] ENDOSCOPY;  Service: Endoscopy;  Laterality: N/A;  . CYSTECTOMY  1970&1990   x2 on tail bone    Current Outpatient Medications:  .  losartan (COZAAR) 100 MG tablet, Take 100 mg by mouth daily., Disp: , Rfl:  .  amLODipine (NORVASC) 10 MG tablet, Take 1 tablet (10 mg total) by mouth daily., Disp: 90 tablet, Rfl: 4 .  aspirin 81 MG tablet, Take 1 tablet by mouth daily., Disp: , Rfl:  .  atorvastatin (LIPITOR) 80 MG tablet, TAKE 1 TABLET DAILY, Disp: 90 tablet, Rfl: 4 .  empagliflozin (JARDIANCE) 25 MG TABS tablet, Take 25 mg by mouth daily., Disp: 90 tablet, Rfl: 4 .  glipiZIDE (GLUCOTROL XL) 10 MG 24 hr tablet, TAKE 1 TABLET DAILY, Disp: 90 tablet, Rfl: 3 .  glucose blood (TRUETEST TEST) test strip, TRUETEST TEST (In Vitro Strip)  Check blood sugar daily for 0 days  Quantity: 100.00;  Refills: 12   Ordered :01-Mar-2011  [name] MD;  Started 25-September-2007 Active Comments:  fishdona (09/25/2007) - With 100 lancets, Disp: , Rfl:  .  hydrochlorothiazide (HYDRODIURIL) 25 MG tablet, Take 1 tablet (25 mg total) by mouth daily., Disp: 90 tablet, Rfl: 4 .  lisinopril (PRINIVIL,ZESTRIL) 20 MG tablet, Take 1 tablet (20 mg total) by mouth daily., Disp: 90 tablet, Rfl: 4 .  metFORMIN (GLUCOPHAGE) 1000 MG tablet, Take 1 tablet (1,000 mg total) by mouth 2 (two) times daily with a meal., Disp: 180 tablet, Rfl: 3 .  metoprolol (TOPROL-XL) 200 MG 24 hr tablet, TAKE 1 TABLET DAILY, Disp: 90 tablet, Rfl: 4 .  nitroGLYCERIN (NITROSTAT) 0.4 MG SL tablet, Place 1 tablet under the tongue. 3-5 minutes x3 as nedded, Disp: , Rfl:  .  sildenafil (VIAGRA) 100 MG tablet, Take by mouth. 1/2-1 tablet as needed, no more than 1 a day, Disp: , Rfl:  .  sitaGLIPtin (JANUVIA) 100 MG tablet, Take 1 tablet (100 mg total) by mouth daily., Disp: 90 tablet, Rfl: 3 .  sitaGLIPtin-metformin (JANUMET) 50-1000 MG tablet, Take 1 tablet by mouth 2 (two) times daily with a meal., Disp: 42 tablet, Rfl: 0  No Known Allergies Review of Systems Objective:   Vitals:   12/21/17 1442  BP: 131/76  Pulse: 68  Resp: 16    General: Well developed, nourished, in no acute distress, alert and oriented x3  Dermatological: Skin is warm, dry and supple bilateral. Nails x 10 are well maintained; remaining integument appears unremarkable at this time. There are no open sores, no preulcerative lesions, no rash or signs of infection present.  Vascular: Dorsalis Pedis artery and Posterior Tibial artery pedal pulses are 2/4 bilateral with immedate capillary fill time. Pedal hair growth present. No varicosities and no lower extremity edema present bilateral.   Neruologic: Grossly intact via light touch bilateral. Vibratory intact via tuning fork bilateral. Protective threshold with Semmes Wienstein monofilament intact to all pedal sites bilateral. Patellar and Achilles deep tendon reflexes 2+ bilateral. No Babinski or clonus  noted bilateral.   Musculoskeletal: No gross boney pedal deformities bilateral. No pain, crepitus, or limitation noted with foot and ankle range of motion bilateral. Muscular strength 5/5 in all groups tested bilateral.  Gait: Unassisted, Nonantalgic.    Radiographs:  Hallux abductovalgus deformity with osteoarthritic changes and changes possibly associated with gout like an early Martell sign.  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  Plan: Offered him an injection today he declined.  If this patient never calls asking for paperwork for an arthritic profile because he has had a gout flareup we are to give him this immediately.     Emrys Mckamie T. [place], [place]

## 2017-12-26 DIAGNOSIS — M1711 Unilateral primary osteoarthritis, right knee: Secondary | ICD-10-CM | POA: Diagnosis not present

## 2017-12-26 DIAGNOSIS — M25561 Pain in right knee: Secondary | ICD-10-CM | POA: Diagnosis not present

## 2018-01-20 ENCOUNTER — Ambulatory Visit: Payer: Medicare Other

## 2018-01-31 ENCOUNTER — Telehealth: Payer: Self-pay | Admitting: Family Medicine

## 2018-01-31 NOTE — Telephone Encounter (Signed)
Pt wanting to come by shortly to pick up a copy of his last CPE.  Thanks, [employer]

## 2018-01-31 NOTE — Telephone Encounter (Signed)
I spoke with Chrys Racer in medical records. Per Chrys Racer, patient needs to first sign consent to ROI. I tried calling patient. Left message to call back.

## 2018-01-31 NOTE — Telephone Encounter (Signed)
Pt came in and picked up AVS for LOV 10/21/17. Thanks TNP

## 2018-02-17 ENCOUNTER — Ambulatory Visit (INDEPENDENT_AMBULATORY_CARE_PROVIDER_SITE_OTHER): Payer: Medicare Other

## 2018-02-17 DIAGNOSIS — Z0000 Encounter for general adult medical examination without abnormal findings: Secondary | ICD-10-CM

## 2018-02-17 NOTE — Patient Instructions (Signed)
Todd Potter , Thank you for taking time to come for your Medicare Wellness Visit. I appreciate your ongoing commitment to your health goals. Please review the following plan we discussed and let me know if I can assist you in the future.   Screening recommendations/referrals: Colonoscopy: Up to date Recommended yearly ophthalmology/optometry visit for glaucoma screening and checkup Recommended yearly dental visit for hygiene and checkup  Vaccinations: Influenza vaccine: Up to date Pneumococcal vaccine: Pt declined the Pneumovax 23 today.  Tdap vaccine: Up to date Shingles vaccine: Pt declines today.     Advanced directives: Please bring a copy of your POA (Power of Attorney) and/or Living Will to your next appointment.   Conditions/risks identified: Obesity- recommend to start exercising 3 days a week for at least 30 minutes at a time.   Next appointment: 02/23/18 with Dr Caryn Section.   Preventive Care 7 Years and Older, Male Preventive care refers to lifestyle choices and visits with your health care provider that can promote health and wellness. What does preventive care include?  A yearly physical exam. This is also called an annual well check.  Dental exams once or twice a year.  Routine eye exams. Ask your health care provider how often you should have your eyes checked.  Personal lifestyle choices, including:  Daily care of your teeth and gums.  Regular physical activity.  Eating a healthy diet.  Avoiding tobacco and drug use.  Limiting alcohol use.  Practicing safe sex.  Taking low doses of aspirin every day.  Taking vitamin and mineral supplements as recommended by your health care provider. What happens during an annual well check? The services and screenings done by your health care provider during your annual well check will depend on your age, overall health, lifestyle risk factors, and family history of disease. Counseling  Your health care provider may ask  you questions about your:  Alcohol use.  Tobacco use.  Drug use.  Emotional well-being.  Home and relationship well-being.  Sexual activity.  Eating habits.  History of falls.  Memory and ability to understand (cognition).  Work and work Statistician. Screening  You may have the following tests or measurements:  Height, weight, and BMI.  Blood pressure.  Lipid and cholesterol levels. These may be checked every 5 years, or more frequently if you are over 13 years old.  Skin check.  Lung cancer screening. You may have this screening every year starting at age 31 if you have a 30-pack-year history of smoking and currently smoke or have quit within the past 15 years.  Fecal occult blood test (FOBT) of the stool. You may have this test every year starting at age 51.  Flexible sigmoidoscopy or colonoscopy. You may have a sigmoidoscopy every 5 years or a colonoscopy every 10 years starting at age 65.  Prostate cancer screening. Recommendations will vary depending on your family history and other risks.  Hepatitis C blood test.  Hepatitis B blood test.  Sexually transmitted disease (STD) testing.  Diabetes screening. This is done by checking your blood sugar (glucose) after you have not eaten for a while (fasting). You may have this done every 1-3 years.  Abdominal aortic aneurysm (AAA) screening. You may need this if you are a current or former smoker.  Osteoporosis. You may be screened starting at age 60 if you are at high risk. Talk with your health care provider about your test results, treatment options, and if necessary, the need for more tests. Vaccines  Your  health care provider may recommend certain vaccines, such as:  Influenza vaccine. This is recommended every year.  Tetanus, diphtheria, and acellular pertussis (Tdap, Td) vaccine. You may need a Td booster every 10 years.  Zoster vaccine. You may need this after age 71.  Pneumococcal 13-valent conjugate  (PCV13) vaccine. One dose is recommended after age 104.  Pneumococcal polysaccharide (PPSV23) vaccine. One dose is recommended after age 31. Talk to your health care provider about which screenings and vaccines you need and how often you need them. This information is not intended to replace advice given to you by your health care provider. Make sure you discuss any questions you have with your health care provider. Document Released: 05/02/2015 Document Revised: 12/24/2015 Document Reviewed: 02/04/2015 Elsevier Interactive Patient Education  2017 [employer] Prevention in the Home Falls can cause injuries. They can happen to people of all ages. There are many things you can do to make your home safe and to help prevent falls. What can I do on the outside of my home?  Regularly fix the edges of walkways and driveways and fix any cracks.  Remove anything that might make you trip as you walk through a door, such as a raised step or threshold.  Trim any bushes or trees on the path to your home.  Use bright outdoor lighting.  Clear any walking paths of anything that might make someone trip, such as rocks or tools.  Regularly check to see if handrails are loose or broken. Make sure that both sides of any steps have handrails.  Any raised decks and porches should have guardrails on the edges.  Have any leaves, snow, or ice cleared regularly.  Use sand or salt on walking paths during winter.  Clean up any spills in your garage right away. This includes oil or grease spills. What can I do in the bathroom?  Use night lights.  Install grab bars by the toilet and in the tub and shower. Do not use towel bars as grab bars.  Use non-skid mats or decals in the tub or shower.  If you need to sit down in the shower, use a plastic, non-slip stool.  Keep the floor dry. Clean up any water that spills on the floor as soon as it happens.  Remove soap buildup in the tub or shower  regularly.  Attach bath mats securely with double-sided non-slip rug tape.  Do not have throw rugs and other things on the floor that can make you trip. What can I do in the bedroom?  Use night lights.  Make sure that you have a light by your bed that is easy to reach.  Do not use any sheets or blankets that are too big for your bed. They should not hang down onto the floor.  Have a firm chair that has side arms. You can use this for support while you get dressed.  Do not have throw rugs and other things on the floor that can make you trip. What can I do in the kitchen?  Clean up any spills right away.  Avoid walking on wet floors.  Keep items that you use a lot in easy-to-reach places.  If you need to reach something above you, use a strong step stool that has a grab bar.  Keep electrical cords out of the way.  Do not use floor polish or wax that makes floors slippery. If you must use wax, use non-skid floor wax.  Do not  have throw rugs and other things on the floor that can make you trip. What can I do with my stairs?  Do not leave any items on the stairs.  Make sure that there are handrails on both sides of the stairs and use them. Fix handrails that are broken or loose. Make sure that handrails are as long as the stairways.  Check any carpeting to make sure that it is firmly attached to the stairs. Fix any carpet that is loose or worn.  Avoid having throw rugs at the top or bottom of the stairs. If you do have throw rugs, attach them to the floor with carpet tape.  Make sure that you have a light switch at the top of the stairs and the bottom of the stairs. If you do not have them, ask someone to add them for you. What else can I do to help prevent falls?  Wear shoes that:  Do not have high heels.  Have rubber bottoms.  Are comfortable and fit you well.  Are closed at the toe. Do not wear sandals.  If you use a stepladder:  Make sure that it is fully  opened. Do not climb a closed stepladder.  Make sure that both sides of the stepladder are locked into place.  Ask someone to hold it for you, if possible.  Clearly mark and make sure that you can see:  Any grab bars or handrails.  First and last steps.  Where the edge of each step is.  Use tools that help you move around (mobility aids) if they are needed. These include:  Canes.  Walkers.  Scooters.  Crutches.  Turn on the lights when you go into a dark area. Replace any light bulbs as soon as they burn out.  Set up your furniture so you have a clear path. Avoid moving your furniture around.  If any of your floors are uneven, fix them.  If there are any pets around you, be aware of where they are.  Review your medicines with your doctor. Some medicines can make you feel dizzy. This can increase your chance of falling. Ask your doctor what other things that you can do to help prevent falls. This information is not intended to replace advice given to you by your health care provider. Make sure you discuss any questions you have with your health care provider. Document Released: 01/30/2009 Document Revised: 09/11/2015 Document Reviewed: 05/10/2014 Elsevier Interactive Patient Education  2017 [employer].

## 2018-02-17 NOTE — Progress Notes (Signed)
Subjective:   Todd Potter is a 67 y.o. male who presents for Medicare Annual/Subsequent preventive examination.  Review of Systems:  N/A  Cardiac Risk Factors include: advanced age (>63men, >24 women);diabetes mellitus;dyslipidemia;hypertension;male gender;obesity (BMI >30kg/m2)     Objective:    Vitals: BP 128/64 (BP Location: Right Arm)   Pulse 66   Temp 98.4 F (36.9 C) (Oral)   Ht 5\' 7"  (1.702 m)   Wt 237 lb 9.6 oz (107.8 kg)   BMI 37.21 kg/m   Body mass index is 37.21 kg/m.  Advanced Directives 02/17/2018 01/14/2017 08/06/2016  Does Patient Have a Medical Advance Directive? No No No  Would patient like information on creating a medical advance directive? - No - Patient declined No - Patient declined    Tobacco Social History   Tobacco Use  Smoking Status Former Smoker  . Last attempt to quit: 04/19/2006  . Years since quitting: 11.8  Smokeless Tobacco Never Used     Counseling given: Not Answered   Clinical Intake:  Pre-visit preparation completed: Yes  Pain : No/denies pain Pain Score: 0-No pain    Nutrition Risk Assessment:  Has the patient had any N/V/D within the last 2 months?  No  Does the patient have any non-healing wounds?  No  Has the patient had any unintentional weight loss or weight gain?  No   Diabetes:  Is the patient diabetic?  Yes  If diabetic, was a CBG obtained today?  No  Did the patient bring in their glucometer from home?  No  How often do you monitor your CBG's? Does not.   Financial Strains and Diabetes Management:   Are you having any financial strains with the device, your supplies or your medication? No .  Does the patient want to be seen by Chronic Care Management for management of their diabetes?  No  Would the patient like to be referred to a Nutritionist or for Diabetic Management?  No   Diabetic Exams:  Diabetic Eye Exam: Completed 07/2017 per pt.   Diabetic Foot Exam: Completed 11/02/13. Pt has been advised  about the importance in completing this exam. Need to be completed at next OV with PCP.  Nutritional Status: BMI > 30  Obese Nutritional Risks: None Diabetes: Yes  How often do you need to have someone help you when you read instructions, pamphlets, or other written materials from your doctor or pharmacy?: 1 - Never  Interpreter Needed?: No  Information entered by :: [facility], LPN  Past Medical History:  Diagnosis Date  . Acanthosis nigricans   . CAD (coronary artery disease)   . Cardiomegaly   . Diabetes mellitus without complication ([place])   . History of adenomatous polyp of colon   . Hyperlipidemia   . Hypertension   . Knee pain   . Myocardial infarct [facility])    Past Surgical History:  Procedure Laterality Date  . CARDIAC CATHETERIZATION     Stent Placement x 5 to RCA   . COLONOSCOPY WITH PROPOFOL N/A 08/06/2016   Procedure: COLONOSCOPY WITH PROPOFOL;  Surgeon: Manya Silvas, MD;  Location: [facility] ENDOSCOPY;  Service: Endoscopy;  Laterality: N/A;  . CYSTECTOMY  1970&1990   x2 on tail bone   Family History  Problem Relation Age of Onset  . Diabetes Mother   . Diabetes Sister   . Anuerysm Brother   . Cancer Brother        brain  . Diabetes Brother    Social History  Socioeconomic History  . Marital status: Married    Spouse name: Not on file  . Number of children: 2  . Years of education: HS Grad  . Highest education level: High school graduate  Occupational History  . Occupation: Part-Time    Comment: Materials engineer  . Financial resource strain: Not hard at all  . Food insecurity:    Worry: Never true    Inability: Never true  . Transportation needs:    Medical: No    Non-medical: No  Tobacco Use  . Smoking status: Former Smoker    Last attempt to quit: 04/19/2006    Years since quitting: 11.8  . Smokeless tobacco: Never Used  Substance and Sexual Activity  . Alcohol use: Yes    Alcohol/week: 0.0 standard drinks    Comment:  drinks 1-2 beer monthly  . Drug use: No  . Sexual activity: Not on file  Lifestyle  . Physical activity:    Days per week: 0 days    Minutes per session: 0 min  . Stress: Not at all  Relationships  . Social connections:    Talks on phone: Patient refused    Gets together: Patient refused    Attends religious service: Patient refused    Active member of club or organization: Patient refused    Attends meetings of clubs or organizations: Patient refused    Relationship status: Patient refused  Other Topics Concern  . Not on file  Social History Narrative  . Not on file    Outpatient Encounter Medications as of 02/17/2018  Medication Sig  . amLODipine (NORVASC) 10 MG tablet Take 1 tablet (10 mg total) by mouth daily.  Marland Kitchen aspirin 81 MG tablet Take 1 tablet by mouth daily.  Marland Kitchen atorvastatin (LIPITOR) 80 MG tablet TAKE 1 TABLET DAILY  . empagliflozin (JARDIANCE) 25 MG TABS tablet Take 25 mg by mouth daily.  Marland Kitchen glipiZIDE (GLUCOTROL XL) 10 MG 24 hr tablet TAKE 1 TABLET DAILY  . glucose blood (TRUETEST TEST) test strip TRUETEST TEST (In Vitro Strip)  Check blood sugar daily for 0 days  Quantity: 100.00;  Refills: 12   Ordered :01-Mar-2011  [name] MD;  Started 25-September-2007 Active Comments: fishdona (09/25/2007) - With 100 lancets  . hydrochlorothiazide (HYDRODIURIL) 25 MG tablet Take 1 tablet (25 mg total) by mouth daily.  Marland Kitchen lisinopril (PRINIVIL,ZESTRIL) 20 MG tablet Take 1 tablet (20 mg total) by mouth daily.  Marland Kitchen losartan (COZAAR) 100 MG tablet Take 100 mg by mouth daily.  . metoprolol (TOPROL-XL) 200 MG 24 hr tablet TAKE 1 TABLET DAILY  . nitroGLYCERIN (NITROSTAT) 0.4 MG SL tablet Place 1 tablet under the tongue. 3-5 minutes x3 as nedded  . sildenafil (VIAGRA) 100 MG tablet Take by mouth. 1/2-1 tablet as needed, no more than 1 a day  . sitaGLIPtin-metformin (JANUMET) 50-1000 MG tablet Take 1 tablet by mouth 2 (two) times daily with a meal.  . metFORMIN (GLUCOPHAGE) 1000 MG tablet  Take 1 tablet (1,000 mg total) by mouth 2 (two) times daily with a meal. (Patient not taking: Reported on 02/17/2018)  . sitaGLIPtin (JANUVIA) 100 MG tablet Take 1 tablet (100 mg total) by mouth daily. (Patient not taking: Reported on 02/17/2018)   No facility-administered encounter medications on file as of 02/17/2018.     Activities of Daily Living In your present state of health, do you have any difficulty performing the following activities: 02/17/2018  Hearing? N  Vision? N  Difficulty concentrating or  making decisions? Y  Walking or climbing stairs? Y  Comment Due to right knee pain.   Dressing or bathing? N  Doing errands, shopping? N  Preparing Food and eating ? N  Using the Toilet? N  In the past six months, have you accidently leaked urine? N  Do you have problems with loss of bowel control? N  Managing your Medications? N  Managing your Finances? N  Housekeeping or managing your Housekeeping? N  Some recent data might be hidden    Patient Care Team: [name], MD as PCP - General (Family Medicine) Shirley Friar, MD (Cardiology) [facility] [place]) [place], Raliegh Ip, NP as Nurse Practitioner (Cardiology) Garrel Ridgel, DPM as Consulting Physician (Podiatry)   Assessment:   This is a routine wellness examination for Kane.  Exercise Activities and Dietary recommendations Current Exercise Habits: The patient does not participate in regular exercise at present, Exercise limited by: Other - see comments(stays busy)  Goals    . Exercise 3x per week (30 min per time)     Recommend to start exercising 3 days a week for at least 30 minutes at a time.     . Reduce portion size             Fall Risk Fall Risk  02/17/2018 01/14/2017 08/05/2016 06/26/2015 02/07/2015  Falls in the past year? 0 No No No No   FALL RISK PREVENTION PERTAINING TO THE HOME:  Any stairs in or around the home WITH handrails? No  Home free of loose throw rugs in walkways, pet  beds, electrical cords, etc? Yes  Adequate lighting in your home to reduce risk of falls? Yes   ASSISTIVE DEVICES UTILIZED TO PREVENT FALLS:  Life alert? No  Use of a cane, walker or w/c? No  Grab bars in the bathroom? No  Shower chair or bench in shower? No  Elevated toilet seat or a handicapped toilet? No    TIMED UP AND GO:  Was the test performed? Yes .     Depression Screen PHQ 2/9 Scores 02/17/2018 01/14/2017 08/05/2016 06/26/2015  PHQ - 2 Score 0 0 0 0  PHQ- 9 Score - - 2 -    Cognitive Function: Declined today.      6CIT Screen 01/14/2017  What Year? 0 points  What month? 0 points  What time? 0 points  Count back from 20 0 points  Months in reverse 0 points  Repeat phrase 2 points  Total Score 2    Immunization History  Administered Date(s) Administered  . Influenza,inj,Quad PF,6+ Mos 02/16/2017  . Influenza-Unspecified 01/18/2015, 02/01/2016  . Pneumococcal Conjugate-13 02/07/2015  . Pneumococcal Polysaccharide-23 04/19/2010  . Td 10/25/1997  . Tdap 03/10/2012    Qualifies for Shingles Vaccine? Yes . Due for Shingrix. Education has been provided regarding the importance of this vaccine. Pt has been advised to call insurance company to determine out of pocket expense. Advised may also receive vaccine at local pharmacy or Health Dept. Verbalized acceptance and understanding.  Tdap: Up to date  Flu Vaccine: Due for Flu vaccine. Does the patient want to receive this vaccine today?  No . Education has been provided regarding the importance of this vaccine but still declined. Advised may receive this vaccine at local pharmacy or Health Dept. Aware to provide a copy of the vaccination record if obtained from local pharmacy or Health Dept. Verbalized acceptance and understanding.  Pneumococcal Vaccine: Due for Pneumococcal vaccine. Does the patient want to  receive this vaccine today?  No . Education has been provided regarding the importance of this vaccine but still  declined. Advised may receive this vaccine at local pharmacy or Health Dept. Aware to provide a copy of the vaccination record if obtained from local pharmacy or Health Dept. Verbalized acceptance and understanding.   Screening Tests Health Maintenance  Topic Date Due  . FOOT EXAM  05/08/1960  . PNA vac Low Risk Adult (2 of 2 - PPSV23) 02/07/2016  . HEMOGLOBIN A1C  04/23/2018  . OPHTHALMOLOGY EXAM  07/19/2018  . COLONOSCOPY  08/06/2021  . TETANUS/TDAP  03/10/2022  . INFLUENZA VACCINE  Completed  . Hepatitis C Screening  Completed   Cancer Screenings:  Colorectal Screening: Completed 08/06/16.   Lung Cancer Screening: (Low Dose CT Chest recommended if Age 46-80 years, 30 pack-year currently smoking OR have quit w/in 15years.) does qualify, however declines referral.    Additional Screening:  Hepatitis C Screening: Up to date  Vision Screening: Recommended annual ophthalmology exams for early detection of glaucoma and other disorders of the eye.  Dental Screening: Recommended annual dental exams for proper oral hygiene  Community Resource Referral:  CRR required this visit?  No        Plan:  I have personally reviewed and addressed the Medicare Annual Wellness questionnaire and have noted the following in the patient's chart:  A. Medical and social history B. Use of alcohol, tobacco or illicit drugs  C. Current medications and supplements D. Functional ability and status E.  Nutritional status F.  Physical activity G. Advance directives H. List of other physicians I.  Hospitalizations, surgeries, and ER visits in previous 12 months J.  [place] such as hearing and vision if needed, cognitive and depression L. Referrals and appointments - none  In addition, I have reviewed and discussed with patient certain preventive protocols, quality metrics, and best practice recommendations. A written personalized care plan for preventive services as well as general  preventive health recommendations were provided to patient.  See attached scanned questionnaire for additional information.   Signed,  Fabio Neighbors, LPN Nurse Health Advisor   Nurse Recommendations: Pt declined the Pneumovax 23 vaccine today. Pt is due for a foot exam. Pt states he had an eye exam in April of this year, records requested.

## 2018-02-23 ENCOUNTER — Ambulatory Visit: Payer: Medicare Other | Admitting: Family Medicine

## 2018-02-24 ENCOUNTER — Encounter: Payer: Self-pay | Admitting: Family Medicine

## 2018-02-24 ENCOUNTER — Ambulatory Visit (INDEPENDENT_AMBULATORY_CARE_PROVIDER_SITE_OTHER): Payer: Medicare Other | Admitting: Family Medicine

## 2018-02-24 VITALS — BP 118/72 | HR 60 | Temp 97.7°F | Resp 16 | Wt 237.0 lb

## 2018-02-24 DIAGNOSIS — E0821 Diabetes mellitus due to underlying condition with diabetic nephropathy: Secondary | ICD-10-CM | POA: Diagnosis not present

## 2018-02-24 DIAGNOSIS — E1165 Type 2 diabetes mellitus with hyperglycemia: Secondary | ICD-10-CM | POA: Diagnosis not present

## 2018-02-24 DIAGNOSIS — E782 Mixed hyperlipidemia: Secondary | ICD-10-CM

## 2018-02-24 DIAGNOSIS — I10 Essential (primary) hypertension: Secondary | ICD-10-CM | POA: Diagnosis not present

## 2018-02-24 DIAGNOSIS — Z125 Encounter for screening for malignant neoplasm of prostate: Secondary | ICD-10-CM | POA: Diagnosis not present

## 2018-02-24 LAB — POCT GLYCOSYLATED HEMOGLOBIN (HGB A1C): Hemoglobin A1C: 6.6 % — AB (ref 4.0–5.6)

## 2018-02-24 MED ORDER — LOSARTAN POTASSIUM 100 MG PO TABS: 100.0000 mg | ORAL_TABLET | Freq: Every day | ORAL | 3 refills | Status: DC

## 2018-02-24 NOTE — Progress Notes (Signed)
Patient: Todd Potter Male    DOB: March 29, 1951   67 y.o.   MRN: [id number] Visit Date: 02/24/2018  Today's Provider: [name], MD   Chief Complaint  Patient presents with  . Hypertension  . Hyperlipidemia  . Diabetes   Subjective:    HPI   Diabetes Mellitus Type II, Follow-up:   Lab Results  Component Value Date   HGBA1C 6.6 (A) 02/24/2018   HGBA1C 8.5 (A) 10/21/2017   HGBA1C 7.5 03/04/2017   Last seen for diabetes 4 months ago.  Management since then includes Changed Janumet to Januvia and Metformin to see if it is more cost effective. He reports excellent compliance with treatment. He is not having side effects.  Current symptoms include none and have been stable. Home blood sugar records: Pt reports he is not checking his blood sugar at home.  Episodes of hypoglycemia? no   Current Insulin Regimen: None Most Recent Eye Exam: UTD Weight trend: stable Prior visit with dietician: no Current diet: in general, a "healthy" diet   Current exercise: Some  ------------------------------------------------------------------------   Hypertension, follow-up:  BP Readings from Last 3 Encounters:  02/17/18 128/64  12/21/17 131/76  10/21/17 130/78    He was last seen for hypertension 4 months ago.  BP at that visit was 130/78. Management since that visit includes no changes He reports excellent compliance with treatment. He is not having side effects.  He is exercising. He is adherent to low salt diet.   Outside blood pressures are Pt states his blood pressures are normal at home. He is experiencing none.  Patient denies chest pain, exertional chest pressure/discomfort, lower extremity edema and palpitations.   Cardiovascular risk factors include none.  Use of agents associated with hypertension: none.   ------------------------------------------------------------------------    Lipid/Cholesterol, Follow-up:   Last seen for this 4 months ago.    Management since that visit includes None.  Last Lipid Panel:    Component Value Date/Time   CHOL 142 12/03/2016 1221   TRIG 94 12/03/2016 1221   HDL 33 (L) 12/03/2016 1221   CHOLHDL 4.3 12/03/2016 1221   LDLCALC 90 12/03/2016 1221    He reports excellent compliance with treatment. He is not having side effects.   Wt Readings from Last 3 Encounters:  02/17/18 237 lb 9.6 oz (107.8 kg)  10/21/17 248 lb (112.5 kg)  03/04/17 262 lb (118.8 kg)    ------------------------------------------------------------------------      No Known Allergies   Current Outpatient Medications:  .  amLODipine (NORVASC) 10 MG tablet, Take 1 tablet (10 mg total) by mouth daily., Disp: 90 tablet, Rfl: 4 .  aspirin 81 MG tablet, Take 1 tablet by mouth daily., Disp: , Rfl:  .  atorvastatin (LIPITOR) 80 MG tablet, TAKE 1 TABLET DAILY, Disp: 90 tablet, Rfl: 4 .  empagliflozin (JARDIANCE) 25 MG TABS tablet, Take 25 mg by mouth daily., Disp: 90 tablet, Rfl: 4 .  glipiZIDE (GLUCOTROL XL) 10 MG 24 hr tablet, TAKE 1 TABLET DAILY, Disp: 90 tablet, Rfl: 3 .  hydrochlorothiazide (HYDRODIURIL) 25 MG tablet, Take 1 tablet (25 mg total) by mouth daily., Disp: 90 tablet, Rfl: 4 .  losartan (COZAAR) 100 MG tablet, Take 100 mg by mouth daily., Disp: , Rfl:  .  metoprolol (TOPROL-XL) 200 MG 24 hr tablet, TAKE 1 TABLET DAILY, Disp: 90 tablet, Rfl: 4 .  nitroGLYCERIN (NITROSTAT) 0.4 MG SL tablet, Place 1 tablet under the tongue. 3-5 minutes x3 as nedded,  Disp: , Rfl:  .  sildenafil (VIAGRA) 100 MG tablet, Take by mouth. 1/2-1 tablet as needed, no more than 1 a day, Disp: , Rfl:  .  sitaGLIPtin-metformin (JANUMET) 50-1000 MG tablet, Take 1 tablet by mouth 2 (two) times daily with a meal., Disp: 42 tablet, Rfl: 0 .  glucose blood (TRUETEST TEST) test strip, TRUETEST TEST (In Vitro Strip)  Check blood sugar daily for 0 days  Quantity: 100.00;  Refills: 12   Ordered :01-Mar-2011  [name] MD;  Started 25-September-2007 Active  Comments: fishdona (09/25/2007) - With 100 lancets, Disp: , Rfl:   Review of Systems  Constitutional: Negative.   Respiratory: Negative.   Cardiovascular: Negative.   Gastrointestinal: Negative.   Endocrine: Negative.   Musculoskeletal: Negative.   Neurological: Negative for dizziness, light-headedness and headaches.    Social History   Tobacco Use  . Smoking status: Former Smoker    Last attempt to quit: 04/19/2006    Years since quitting: 11.8  . Smokeless tobacco: Never Used  Substance Use Topics  . Alcohol use: Yes    Alcohol/week: 0.0 standard drinks    Comment: drinks 1-2 beer monthly   Objective:   BP 118/72 (BP Location: Right Arm, Patient Position: Sitting, Cuff Size: Large)   Pulse 60   Temp 97.7 F (36.5 C) (Oral)   Resp 16   Wt 237 lb (107.5 kg)   BMI 37.12 kg/m     Physical Exam  General Appearance:    Alert, cooperative, no distress, obese  Eyes:    PERRL, conjunctiva/corneas clear, EOM's intact       Lungs:     Clear to auscultation bilaterally, respirations unlabored  Heart:    Regular rate and rhythm  Neurologic:   Awake, alert, oriented x 3. No apparent focal neurological           defect.       Results for orders placed or performed in visit on 02/24/18  POCT glycosylated hemoglobin (Hb A1C)  Result Value Ref Range   Hemoglobin A1C 6.6 (A) 4.0 - 5.6 %       Assessment & Plan:     1. Diabetes mellitus due to underlying condition, controlled, with diabetic nephropathy, without long-term current use of insulin ([place]) Well controlled Continue current medications.   - POCT glycosylated hemoglobin (Hb A1C)  2. Morbid obesity ([place]) Doing well with 25 pound weight loss over the last year. Continue working on diet and staying physically active.   3. Essential hypertension Well controlled.  Continue current medications.    4. Hyperlipidemia, mixed He is tolerating atorvastatin well with no adverse effects.   - Comprehensive metabolic panel -  Lipid panel  5. Prostate cancer screening  - PSA  Return in about 6 months (around 08/25/2018).       [name], MD  [facility]

## 2018-03-02 DIAGNOSIS — E782 Mixed hyperlipidemia: Secondary | ICD-10-CM | POA: Diagnosis not present

## 2018-03-02 DIAGNOSIS — Z125 Encounter for screening for malignant neoplasm of prostate: Secondary | ICD-10-CM | POA: Diagnosis not present

## 2018-03-03 ENCOUNTER — Telehealth: Payer: Self-pay

## 2018-03-03 LAB — COMPREHENSIVE METABOLIC PANEL
ALT: 11 IU/L (ref 0–44)
AST: 12 IU/L (ref 0–40)
Albumin/Globulin Ratio: 1.1 — ABNORMAL LOW (ref 1.2–2.2)
Albumin: 4.2 g/dL (ref 3.6–4.8)
Alkaline Phosphatase: 100 IU/L (ref 39–117)
BUN/Creatinine Ratio: 12 (ref 10–24)
BUN: 15 mg/dL (ref 8–27)
Bilirubin Total: 0.7 mg/dL (ref 0.0–1.2)
CO2: 22 mmol/L (ref 20–29)
Calcium: 9.5 mg/dL (ref 8.6–10.2)
Chloride: 96 mmol/L (ref 96–106)
Creatinine, Ser: 1.25 mg/dL (ref 0.76–1.27)
GFR calc Af Amer: 68 mL/min/{1.73_m2} (ref >59)
GFR calc non Af Amer: 59 mL/min/{1.73_m2} — ABNORMAL LOW (ref >59)
Globulin, Total: 3.8 g/dL (ref 1.5–4.5)
Glucose: 120 mg/dL — ABNORMAL HIGH (ref 65–99)
Potassium: 3.6 mmol/L (ref 3.5–5.2)
Sodium: 137 mmol/L (ref 134–144)
Total Protein: 8 g/dL (ref 6.0–8.5)

## 2018-03-03 LAB — LIPID PANEL
Chol/HDL Ratio: 3.1 ratio (ref 0.0–5.0)
Cholesterol, Total: 125 mg/dL (ref 100–199)
HDL: 40 mg/dL (ref >39)
LDL Calculated: 66 mg/dL (ref 0–99)
Triglycerides: 94 mg/dL (ref 0–149)
VLDL Cholesterol Cal: 19 mg/dL (ref 5–40)

## 2018-03-03 LAB — PSA: Prostate Specific Ag, Serum: 1.4 ng/mL (ref 0.0–4.0)

## 2018-03-03 NOTE — Telephone Encounter (Signed)
Pt advised.   Thanks,   -Laura  

## 2018-03-03 NOTE — Telephone Encounter (Signed)
-----   Message from [name], MD sent at 03/03/2018  7:56 AM EST ----- PSA, blood sugar, kidney functions, electrolytes and cholesterol are all normal. Continue current medications.  Follow up in May for diabetes as scheduled.

## 2018-03-28 ENCOUNTER — Telehealth: Payer: Self-pay | Admitting: Family Medicine

## 2018-03-28 NOTE — Telephone Encounter (Signed)
Pt needing free samples of:  sitaGLIPtin-metformin (JANUMET) 50-1000 MG tablet empagliflozin (JARDIANCE) 25 MG TABS tablet  Please call pt back to let him know if available and if he can pick them up.  Thanks, [employer]

## 2018-03-28 NOTE — Telephone Encounter (Signed)
Pt has been given the samples.   Thanks,   -Mickel Baas

## 2018-03-28 NOTE — Telephone Encounter (Signed)
Can have three bottles of each.

## 2018-04-20 ENCOUNTER — Other Ambulatory Visit: Payer: Self-pay | Admitting: Family Medicine

## 2018-05-11 DIAGNOSIS — E782 Mixed hyperlipidemia: Secondary | ICD-10-CM | POA: Diagnosis not present

## 2018-05-11 DIAGNOSIS — I2510 Atherosclerotic heart disease of native coronary artery without angina pectoris: Secondary | ICD-10-CM | POA: Diagnosis not present

## 2018-05-11 DIAGNOSIS — I10 Essential (primary) hypertension: Secondary | ICD-10-CM | POA: Diagnosis not present

## 2018-05-16 ENCOUNTER — Telehealth: Payer: Self-pay | Admitting: Family Medicine

## 2018-05-16 NOTE — Telephone Encounter (Signed)
Is it okay to give?  Thanks,   -Mirenda Baltazar   

## 2018-05-16 NOTE — Telephone Encounter (Signed)
Given samples of both

## 2018-05-16 NOTE — Telephone Encounter (Signed)
Patient came in the office asking for Janumet 50-1000 samples and Jardiance 25 mg. Samples.

## 2018-06-16 ENCOUNTER — Other Ambulatory Visit: Payer: Self-pay | Admitting: Family Medicine

## 2018-06-16 ENCOUNTER — Telehealth: Payer: Self-pay | Admitting: Family Medicine

## 2018-06-16 DIAGNOSIS — E0821 Diabetes mellitus due to underlying condition with diabetic nephropathy: Secondary | ICD-10-CM

## 2018-06-16 NOTE — Telephone Encounter (Signed)
[facility] ref[facility] request for the following medications:  metoprolol (TOPROL-XL) 200 MG 24 hr tablet   Please advise.

## 2018-06-16 NOTE — Telephone Encounter (Signed)
°  Pt needing refills on: sitaGLIPtin-metformin (JANUMET) 50-1000 MG tablet  empagliflozin (JARDIANCE) 25 MG TABS tablet   [facility] fill at:  [id number] [address], [place] [phone] (Phone) [phone] (Fax)   Thanks, [employer]

## 2018-06-16 NOTE — Telephone Encounter (Signed)
Medications were sent in today.

## 2018-06-19 MED ORDER — METOPROLOL SUCCINATE ER 200 MG PO TB24: 200.0000 mg | ORAL_TABLET | Freq: Every day | ORAL | 0 refills | Status: DC

## 2018-06-19 NOTE — Telephone Encounter (Signed)
Spoke with [facility] they did not get this RX.  (I think it was dated 06/13/2017).   Thanks,   -Mickel Baas

## 2018-06-19 NOTE — Telephone Encounter (Signed)
Pt calling back to check on metoprolol (TOPROL-XL) 200 MG 24 hr tablet refill. He hasn't taken the medication for 2 weeks, He would like to continue with the mail order pharmacy.  Please advise pt of status and what can be done asap.  Thanks, [employer]

## 2018-06-19 NOTE — Telephone Encounter (Signed)
Please review for Dr. Fisher.   Thanks,   -Laura  

## 2018-06-19 NOTE — Telephone Encounter (Signed)
Dr. Caryn Section sent this in 06/13/2018. Can we please call mail order to make sure they received this?

## 2018-06-22 ENCOUNTER — Other Ambulatory Visit: Payer: Self-pay | Admitting: Family Medicine

## 2018-06-23 ENCOUNTER — Telehealth: Payer: Self-pay

## 2018-06-23 DIAGNOSIS — E0821 Diabetes mellitus due to underlying condition with diabetic nephropathy: Secondary | ICD-10-CM

## 2018-06-23 NOTE — Telephone Encounter (Signed)
Is it okay to give?  Thanks,   -Laura   

## 2018-06-23 NOTE — Telephone Encounter (Signed)
Patient is a patient of Dr. Caryn Section wanting to know if we have any samples of Jardiance or Janumet that he could pick up today. Please review chart and advise. KW

## 2018-06-23 NOTE — Telephone Encounter (Signed)
Dr[facility] Caryn [facility] [address] [facility] for both of these. May have a 2 weeks samples if we have them.

## 2018-06-26 ENCOUNTER — Telehealth: Payer: Self-pay | Admitting: Family Medicine

## 2018-06-26 MED ORDER — SITAGLIPTIN PHOS-METFORMIN HCL 50-1000 MG PO TABS: 1.0000 | ORAL_TABLET | Freq: Two times a day (BID) | ORAL | 0 refills | Status: DC

## 2018-06-26 MED ORDER — EMPAGLIFLOZIN 25 MG PO TABS: ORAL_TABLET | ORAL | 0 refills | Status: DC

## 2018-06-26 NOTE — Telephone Encounter (Signed)
Samples left up front. Patient advised.

## 2018-06-26 NOTE — Telephone Encounter (Signed)
Patient advised per Dr. Caryn Section that he could take 1 tablet daily of the 50/500mg  dose since were out of the 50/1000mg , and patient is unable to afford copay. Patient verbally voiced understanding.

## 2018-06-26 NOTE — Telephone Encounter (Signed)
Pt came by today and picked up samples of the [place].  He said the samples he picked up were Janumet 50/500 and he needed 50/1000.  Do we have any of these samples in stock and if not what do you want him to do  CB#  [phone]  Thanks teri

## 2018-06-30 ENCOUNTER — Other Ambulatory Visit: Payer: Self-pay | Admitting: Family Medicine

## 2018-06-30 MED ORDER — METOPROLOL SUCCINATE ER 200 MG PO TB24: 200.0000 mg | ORAL_TABLET | Freq: Every day | ORAL | 4 refills | Status: DC

## 2018-06-30 NOTE — Telephone Encounter (Signed)
[facility] faxed refill request for the following medications:  metoprolol (TOPROL-XL) 200 MG 24 hr tablet  90 day supply  The Rx that was sent on 06/19/2018 was sent to the local pharmacy not mail order. Please advise. Thanks TNP

## 2018-07-06 ENCOUNTER — Telehealth: Payer: Self-pay | Admitting: Family Medicine

## 2018-07-06 NOTE — Telephone Encounter (Signed)
°  Janumet 50/1000 - pt wanting to know if samples have come in on this Rx that he can have?  Please advise.  Thanks, [employer]

## 2018-07-06 NOTE — Telephone Encounter (Signed)
He he can have a month supply of extended release

## 2018-07-06 NOTE — Telephone Encounter (Signed)
Pt advised.   Thanks,   -Rosamond Andress  

## 2018-07-06 NOTE — Telephone Encounter (Signed)
I have regular release and extended release on my desk.  Is it okay to give?  Thanks,   -Mickel Baas

## 2018-08-24 ENCOUNTER — Ambulatory Visit: Payer: Medicare Other | Admitting: Family Medicine

## 2018-08-25 ENCOUNTER — Ambulatory Visit (INDEPENDENT_AMBULATORY_CARE_PROVIDER_SITE_OTHER): Payer: Medicare Other | Admitting: Family Medicine

## 2018-08-25 ENCOUNTER — Encounter: Payer: Self-pay | Admitting: Family Medicine

## 2018-08-25 ENCOUNTER — Other Ambulatory Visit: Payer: Self-pay

## 2018-08-25 VITALS — BP 128/78 | HR 62 | Temp 98.2°F | Resp 16 | Ht 66.5 in | Wt 232.0 lb

## 2018-08-25 DIAGNOSIS — M1711 Unilateral primary osteoarthritis, right knee: Secondary | ICD-10-CM | POA: Diagnosis not present

## 2018-08-25 DIAGNOSIS — Z23 Encounter for immunization: Secondary | ICD-10-CM | POA: Diagnosis not present

## 2018-08-25 DIAGNOSIS — E119 Type 2 diabetes mellitus without complications: Secondary | ICD-10-CM

## 2018-08-25 DIAGNOSIS — E0821 Diabetes mellitus due to underlying condition with diabetic nephropathy: Secondary | ICD-10-CM

## 2018-08-25 LAB — POCT GLYCOSYLATED HEMOGLOBIN (HGB A1C)
Est. average glucose Bld gHb Est-mCnc: 166
Hemoglobin A1C: 7.4 % — AB (ref 4.0–5.6)

## 2018-08-25 MED ORDER — TADALAFIL 5 MG PO TABS: 5.0000 mg | ORAL_TABLET | ORAL | 5 refills | Status: DC | PRN

## 2018-08-25 NOTE — Progress Notes (Signed)
Patient: Todd Potter Male    DOB: 1950/09/28   68 y.o.   MRN: [id number] Visit Date: 08/25/2018  Today's Provider: [name], MD   Chief Complaint  Patient presents with  . Diabetes  . Hypertension  . Hyperlipidemia   Subjective:     HPI  Diabetes Mellitus Type II, Follow-up:   Lab Results  Component Value Date   HGBA1C 6.6 (A) 02/24/2018   HGBA1C 8.5 (A) 10/21/2017   HGBA1C 7.5 03/04/2017    Last seen for diabetes 6 months ago.  Management since then includes no changes. He reports good compliance with treatment. He is not having side effects.  Current symptoms include none and have been stable. Home blood sugar records: not being checked  Episodes of hypoglycemia? no   Current Insulin Regimen: none Most Recent Eye Exam: up to date Weight trend: stable Prior visit with dietician: No Current exercise: walking Current diet habits: well balanced  Pertinent Labs:    Component Value Date/Time   CHOL 125 03/02/2018 0853   TRIG 94 03/02/2018 0853   HDL 40 03/02/2018 0853   LDLCALC 66 03/02/2018 0853   CREATININE 1.25 03/02/2018 0853    Wt Readings from Last 3 Encounters:  08/25/18 232 lb (105.2 kg)  02/24/18 237 lb (107.5 kg)  02/17/18 237 lb 9.6 oz (107.8 kg)       Hypertension, follow-up:  BP Readings from Last 3 Encounters:  08/25/18 128/78  02/24/18 118/72  02/17/18 128/64    He was last seen for hypertension 6 months ago.  BP at that visit was 118/72. Management since that visit includes no changes. He reports good compliance with treatment. He is not having side effects.  He is not exercising. He is adherent to low salt diet.   Outside blood pressures are not being checked. He is experiencing none.  Patient denies exertional chest pressure/discomfort, lower extremity edema and palpitations.   Cardiovascular risk factors include diabetes mellitus and dyslipidemia.     Lipid/Cholesterol, Follow-up:   Last seen for this6  months ago.  Management changes since that visit include no changes. . Last Lipid Panel:    Component Value Date/Time   CHOL 125 03/02/2018 0853   TRIG 94 03/02/2018 0853   HDL 40 03/02/2018 0853   CHOLHDL 3.1 03/02/2018 0853   LDLCALC 66 03/02/2018 0853    Risk factors for vascular disease include diabetes mellitus and hypertension  He reports good compliance with treatment. He is not having side effects.  Current symptoms include none and have been stable.  He also states he has been having a lot of arthritis pain in his right knee, has tried several topical medications with little improvement.   No Known Allergies   Current Outpatient Medications:  .  amLODipine (NORVASC) 10 MG tablet, Take 1 tablet (10 mg total) by mouth daily., Disp: 90 tablet, Rfl: 4 .  aspirin 81 MG tablet, Take 1 tablet by mouth daily., Disp: , Rfl:  .  atorvastatin (LIPITOR) 80 MG tablet, TAKE 1 TABLET DAILY, Disp: 90 tablet, Rfl: 4 .  empagliflozin (JARDIANCE) 25 MG TABS tablet, TAKE 25 MG BY MOUTH DAILY. (NOT COVER- CONTACTING MD), Disp: 7 tablet, Rfl: 0 .  glipiZIDE (GLUCOTROL XL) 10 MG 24 hr tablet, TAKE 1 TABLET DAILY, Disp: 90 tablet, Rfl: 3 .  glucose blood (TRUETEST TEST) test strip, TRUETEST TEST (In Vitro Strip)  Check blood sugar daily for 0 days  Quantity: 100.00;  Refills: 12  Ordered :01-Mar-2011  [name] MD;  Started 25-September-2007 Active Comments: fishdona (09/25/2007) - With 100 lancets, Disp: , Rfl:  .  hydrochlorothiazide (HYDRODIURIL) 25 MG tablet, Take 1 tablet (25 mg total) by mouth daily., Disp: 90 tablet, Rfl: 4 .  losartan (COZAAR) 100 MG tablet, TAKE 1 TABLET BY MOUTH EVERY DAY, Disp: 90 tablet, Rfl: 4 .  metoprolol (TOPROL-XL) 200 MG 24 hr tablet, Take 1 tablet (200 mg total) by mouth daily., Disp: 90 tablet, Rfl: 4 .  nitroGLYCERIN (NITROSTAT) 0.4 MG SL tablet, Place 1 tablet under the tongue. 3-5 minutes x3 as nedded, Disp: , Rfl:  .  sildenafil (VIAGRA) 100 MG tablet,  Take by mouth. 1/2-1 tablet as needed, no more than 1 a day, Disp: , Rfl:  .  sitaGLIPtin-metformin (JANUMET) 50-1000 MG tablet, Take 1 tablet by mouth 2 (two) times daily., Disp: 28 tablet, Rfl: 0  Review of Systems  Constitutional: Negative for activity change, appetite change, chills, diaphoresis, fatigue, fever and unexpected weight change.  Respiratory: Negative for cough and shortness of breath.   Cardiovascular: Negative for chest pain, palpitations and leg swelling.  Endocrine: Negative for cold intolerance, heat intolerance, polydipsia, polyphagia and polyuria.  Musculoskeletal: Positive for arthralgias.  Neurological: Negative for dizziness, light-headedness and headaches.    Social History   Tobacco Use  . Smoking status: Former Smoker    Last attempt to quit: 04/19/2006    Years since quitting: 12.3  . Smokeless tobacco: Never Used  Substance Use Topics  . Alcohol use: Yes    Alcohol/week: 0.0 standard drinks    Comment: drinks 1-2 beer monthly      Objective:   BP 128/78 (BP Location: Right Arm, Patient Position: Sitting, Cuff Size: Large)   Pulse 62   Temp 98.2 F (36.8 C)   Resp 16   Ht 5' 6.5" (1.689 m)   Wt 232 lb (105.2 kg)   SpO2 98%   BMI 36.88 kg/m  Vitals:   08/25/18 0853  BP: 128/78  Pulse: 62  Resp: 16  Temp: 98.2 F (36.8 C)  SpO2: 98%  Weight: 232 lb (105.2 kg)  Height: 5' 6.5" (1.689 m)     Physical Exam   General Appearance:    Alert, cooperative, no distress  Eyes:    PERRL, conjunctiva/corneas clear, EOM's intact       Lungs:     Clear to auscultation bilaterally, respirations unlabored  Heart:    Regular rate and rhythm  Neurologic:   Awake, alert, oriented x 3. No apparent focal neurological           defect.       Results for orders placed or performed in visit on 08/25/18  POCT glycosylated hemoglobin (Hb A1C)  Result Value Ref Range   Hemoglobin A1C 7.4 (A) 4.0 - 5.6 %   Est. average glucose Bld gHb Est-mCnc 166         Assessment & Plan    1. Diabetes mellitus due to underlying condition, controlled, with diabetic nephropathy, without long-term current use of insulin ([place]) Well controlled.  Continue current medications.   - POCT glycosylated hemoglobin (Hb A1C)  2. Morbid obesity ([place]) Counseled regarding prudent diet and regular exercise.    3. Osteoarthritis of right knee, unspecified osteoarthritis type Try OTC Osteobi-flex  4. Need for pneumococcal vaccination  - Pneumococcal polysaccharide vaccine 23-valent greater than or equal to 2yo subcutaneous/IM  Follow up 6 months.     [name], MD  [facility]

## 2018-08-25 NOTE — Patient Instructions (Addendum)
.   Please bring all of your medications to every appointment so we can make sure that our medication list is the same as yours.    You can try OTC Osteobi-flex (glusosamine and chondroitin sulfate) for a month to see if it helps with knee pain   The CDC recommends two doses of Shingrix (the shingles vaccine) separated by 2 to 6 months for adults age 69 years and older. I recommend checking with your insurance plan regarding coverage for this vaccine.   Marland Kitchen You can install the GoodRx app on your smart phone or go to goodrx.com on your computer to find the lowest prices for generic medications.

## 2018-09-07 ENCOUNTER — Other Ambulatory Visit: Payer: Self-pay | Admitting: Family Medicine

## 2018-09-10 ENCOUNTER — Other Ambulatory Visit: Payer: Self-pay | Admitting: Physician Assistant

## 2018-11-08 DIAGNOSIS — I10 Essential (primary) hypertension: Secondary | ICD-10-CM | POA: Diagnosis not present

## 2018-11-08 DIAGNOSIS — I252 Old myocardial infarction: Secondary | ICD-10-CM | POA: Diagnosis not present

## 2018-11-08 DIAGNOSIS — I2510 Atherosclerotic heart disease of native coronary artery without angina pectoris: Secondary | ICD-10-CM | POA: Diagnosis not present

## 2018-11-17 ENCOUNTER — Encounter: Payer: Self-pay | Admitting: Emergency Medicine

## 2018-11-17 ENCOUNTER — Observation Stay
Admission: EM | Admit: 2018-11-17 | Discharge: 2018-11-18 | Disposition: A | Discharge status: Home / Self Care | Payer: Medicare Other | Attending: Internal Medicine | Admitting: Internal Medicine

## 2018-11-17 ENCOUNTER — Observation Stay: Payer: Medicare Other

## 2018-11-17 ENCOUNTER — Other Ambulatory Visit: Payer: Self-pay

## 2018-11-17 ENCOUNTER — Telehealth: Payer: Self-pay

## 2018-11-17 ENCOUNTER — Emergency Department: Payer: Medicare Other

## 2018-11-17 DIAGNOSIS — E119 Type 2 diabetes mellitus without complications: Secondary | ICD-10-CM | POA: Diagnosis not present

## 2018-11-17 DIAGNOSIS — I639 Cerebral infarction, unspecified: Secondary | ICD-10-CM | POA: Insufficient documentation

## 2018-11-17 DIAGNOSIS — Z79899 Other long term (current) drug therapy: Secondary | ICD-10-CM | POA: Diagnosis not present

## 2018-11-17 DIAGNOSIS — I6930 Unspecified sequelae of cerebral infarction: Secondary | ICD-10-CM | POA: Diagnosis present

## 2018-11-17 DIAGNOSIS — R531 Weakness: Secondary | ICD-10-CM | POA: Diagnosis not present

## 2018-11-17 DIAGNOSIS — I252 Old myocardial infarction: Secondary | ICD-10-CM | POA: Insufficient documentation

## 2018-11-17 DIAGNOSIS — E782 Mixed hyperlipidemia: Secondary | ICD-10-CM | POA: Insufficient documentation

## 2018-11-17 DIAGNOSIS — E876 Hypokalemia: Secondary | ICD-10-CM | POA: Insufficient documentation

## 2018-11-17 DIAGNOSIS — Z1159 Encounter for screening for other viral diseases: Secondary | ICD-10-CM | POA: Insufficient documentation

## 2018-11-17 DIAGNOSIS — R262 Difficulty in walking, not elsewhere classified: Secondary | ICD-10-CM | POA: Diagnosis not present

## 2018-11-17 DIAGNOSIS — R29818 Other symptoms and signs involving the nervous system: Secondary | ICD-10-CM | POA: Diagnosis not present

## 2018-11-17 DIAGNOSIS — Z7982 Long term (current) use of aspirin: Secondary | ICD-10-CM | POA: Insufficient documentation

## 2018-11-17 DIAGNOSIS — Z7984 Long term (current) use of oral hypoglycemic drugs: Secondary | ICD-10-CM | POA: Insufficient documentation

## 2018-11-17 DIAGNOSIS — Z6837 Body mass index (BMI) 37.0-37.9, adult: Secondary | ICD-10-CM | POA: Insufficient documentation

## 2018-11-17 DIAGNOSIS — I10 Essential (primary) hypertension: Secondary | ICD-10-CM | POA: Insufficient documentation

## 2018-11-17 DIAGNOSIS — M25551 Pain in right hip: Secondary | ICD-10-CM | POA: Insufficient documentation

## 2018-11-17 DIAGNOSIS — I2510 Atherosclerotic heart disease of native coronary artery without angina pectoris: Secondary | ICD-10-CM | POA: Diagnosis not present

## 2018-11-17 DIAGNOSIS — G8929 Other chronic pain: Secondary | ICD-10-CM | POA: Insufficient documentation

## 2018-11-17 DIAGNOSIS — I6523 Occlusion and stenosis of bilateral carotid arteries: Secondary | ICD-10-CM | POA: Diagnosis not present

## 2018-11-17 DIAGNOSIS — Z87891 Personal history of nicotine dependence: Secondary | ICD-10-CM | POA: Diagnosis not present

## 2018-11-17 HISTORY — DX: Cerebral infarction, unspecified: I63.9

## 2018-11-17 LAB — TROPONIN I (HIGH SENSITIVITY)
Troponin I (High Sensitivity): 15 ng/L (ref <18)
Troponin I (High Sensitivity): 14 ng/L (ref <18)

## 2018-11-17 LAB — PROTIME-INR
INR: 1 (ref 0.8–1.2)
Prothrombin Time: 12.8 seconds (ref 11.4–15.2)

## 2018-11-17 LAB — COMPREHENSIVE METABOLIC PANEL
ALT: 17 U/L (ref 0–44)
AST: 18 U/L (ref 15–41)
Albumin: 3.8 g/dL (ref 3.5–5.0)
Alkaline Phosphatase: 89 U/L (ref 38–126)
Anion gap: 10 (ref 5–15)
BUN: 15 mg/dL (ref 8–23)
CO2: 24 mmol/L (ref 22–32)
Calcium: 9 mg/dL (ref 8.9–10.3)
Chloride: 102 mmol/L (ref 98–111)
Creatinine, Ser: 1.25 mg/dL — ABNORMAL HIGH (ref 0.61–1.24)
GFR calc Af Amer: >60 mL/min (ref >60)
GFR calc non Af Amer: 58 mL/min — ABNORMAL LOW (ref >60)
Glucose, Bld: 238 mg/dL — ABNORMAL HIGH (ref 70–99)
Potassium: 2.9 mmol/L — ABNORMAL LOW (ref 3.5–5.1)
Sodium: 136 mmol/L (ref 135–145)
Total Bilirubin: 0.7 mg/dL (ref 0.3–1.2)
Total Protein: 7.9 g/dL (ref 6.5–8.1)

## 2018-11-17 LAB — DIFFERENTIAL
Abs Immature Granulocytes: 0.04 10*3/uL (ref 0.00–0.07)
Basophils Absolute: 0 10*3/uL (ref 0.0–0.1)
Basophils Relative: 0 %
Eosinophils Absolute: 0.5 10*3/uL (ref 0.0–0.5)
Eosinophils Relative: 5 %
Immature Granulocytes: 0 %
Lymphocytes Relative: 21 %
Lymphs Abs: 2.4 10*3/uL (ref 0.7–4.0)
Monocytes Absolute: 1.2 10*3/uL — ABNORMAL HIGH (ref 0.1–1.0)
Monocytes Relative: 11 %
Neutro Abs: 7 10*3/uL (ref 1.7–7.7)
Neutrophils Relative %: 63 %

## 2018-11-17 LAB — CBC
HCT: 45.2 % (ref 39.0–52.0)
Hemoglobin: 15.2 g/dL (ref 13.0–17.0)
MCH: 29 pg (ref 26.0–34.0)
MCHC: 33.6 g/dL (ref 30.0–36.0)
MCV: 86.1 fL (ref 80.0–100.0)
Platelets: 170 10*3/uL (ref 150–400)
RBC: 5.25 MIL/uL (ref 4.22–5.81)
RDW: 15.1 % (ref 11.5–15.5)
WBC: 11.1 10*3/uL — ABNORMAL HIGH (ref 4.0–10.5)
nRBC: 0 % (ref 0.0–0.2)

## 2018-11-17 LAB — GLUCOSE, CAPILLARY
Glucose-Capillary: 123 mg/dL — ABNORMAL HIGH (ref 70–99)
Glucose-Capillary: 199 mg/dL — ABNORMAL HIGH (ref 70–99)

## 2018-11-17 LAB — APTT: aPTT: 29 seconds (ref 24–36)

## 2018-11-17 LAB — MAGNESIUM: Magnesium: 1.6 mg/dL — ABNORMAL LOW (ref 1.7–2.4)

## 2018-11-17 LAB — SARS CORONAVIRUS 2 BY RT PCR (HOSPITAL ORDER, PERFORMED IN ~~LOC~~ HOSPITAL LAB): SARS Coronavirus 2: NEGATIVE

## 2018-11-17 MED ORDER — ACETAMINOPHEN 160 MG/5ML PO SOLN
650.0000 mg | ORAL | Status: DC | PRN
  Filled 2018-11-17: qty 20.3

## 2018-11-17 MED ORDER — INSULIN ASPART 100 UNIT/ML ~~LOC~~ SOLN
0.0000 [IU] | Freq: Three times a day (TID) | SUBCUTANEOUS | Status: DC
  Administered 2018-11-17: 1 [IU] via SUBCUTANEOUS
  Administered 2018-11-18 (×2): 2 [IU] via SUBCUTANEOUS
  Filled 2018-11-17 (×3): qty 1

## 2018-11-17 MED ORDER — STROKE: EARLY STAGES OF RECOVERY BOOK
Freq: Once | Status: AC
  Administered 2018-11-17: 18:00:00

## 2018-11-17 MED ORDER — ENOXAPARIN SODIUM 40 MG/0.4ML ~~LOC~~ SOLN
40.0000 mg | SUBCUTANEOUS | Status: DC
  Administered 2018-11-17: 18:00:00 40 mg via SUBCUTANEOUS
  Filled 2018-11-17: qty 0.4

## 2018-11-17 MED ORDER — GLIPIZIDE ER 10 MG PO TB24
10.0000 mg | ORAL_TABLET | Freq: Every day | ORAL | Status: DC
  Administered 2018-11-18: 10 mg via ORAL
  Filled 2018-11-17: qty 1

## 2018-11-17 MED ORDER — METOPROLOL SUCCINATE ER 50 MG PO TB24
200.0000 mg | ORAL_TABLET | Freq: Every day | ORAL | Status: DC
  Administered 2018-11-18: 200 mg via ORAL
  Filled 2018-11-17 (×2): qty 4

## 2018-11-17 MED ORDER — HYDROCHLOROTHIAZIDE 25 MG PO TABS
25.0000 mg | ORAL_TABLET | Freq: Every day | ORAL | Status: DC
  Administered 2018-11-18: 09:00:00 25 mg via ORAL
  Filled 2018-11-17: qty 1

## 2018-11-17 MED ORDER — ASPIRIN EC 325 MG PO TBEC
325.0000 mg | DELAYED_RELEASE_TABLET | Freq: Every day | ORAL | Status: DC
  Administered 2018-11-17 – 2018-11-18 (×2): 325 mg via ORAL
  Filled 2018-11-17 (×3): qty 1

## 2018-11-17 MED ORDER — ACETAMINOPHEN 650 MG RE SUPP: 650.0000 mg | RECTAL | Status: DC | PRN

## 2018-11-17 MED ORDER — SENNOSIDES-DOCUSATE SODIUM 8.6-50 MG PO TABS: 1.0000 | ORAL_TABLET | Freq: Every evening | ORAL | Status: DC | PRN

## 2018-11-17 MED ORDER — NITROGLYCERIN 0.4 MG SL SUBL: 0.4000 mg | SUBLINGUAL_TABLET | SUBLINGUAL | Status: DC | PRN

## 2018-11-17 MED ORDER — ACETAMINOPHEN 325 MG PO TABS: 650.0000 mg | ORAL_TABLET | ORAL | Status: DC | PRN

## 2018-11-17 MED ORDER — ENOXAPARIN SODIUM 40 MG/0.4ML ~~LOC~~ SOLN: 40.0000 mg | SUBCUTANEOUS | Status: DC

## 2018-11-17 MED ORDER — ASPIRIN 325 MG PO TABS
325.0000 mg | ORAL_TABLET | Freq: Every day | ORAL | Status: DC
  Filled 2018-11-17 (×2): qty 1

## 2018-11-17 MED ORDER — AMLODIPINE BESYLATE 10 MG PO TABS
10.0000 mg | ORAL_TABLET | Freq: Every day | ORAL | Status: DC
  Administered 2018-11-18: 09:00:00 10 mg via ORAL
  Filled 2018-11-17 (×2): qty 1

## 2018-11-17 MED ORDER — INSULIN ASPART 100 UNIT/ML ~~LOC~~ SOLN: 0.0000 [IU] | Freq: Every day | SUBCUTANEOUS | Status: DC

## 2018-11-17 MED ORDER — POTASSIUM CHLORIDE CRYS ER 20 MEQ PO TBCR
40.0000 meq | EXTENDED_RELEASE_TABLET | Freq: Once | ORAL | Status: AC
  Administered 2018-11-17: 40 meq via ORAL
  Filled 2018-11-17: qty 2

## 2018-11-17 MED ORDER — ASPIRIN 300 MG RE SUPP: 300.0000 mg | Freq: Every day | RECTAL | Status: DC

## 2018-11-17 MED ORDER — ATORVASTATIN CALCIUM 20 MG PO TABS
80.0000 mg | ORAL_TABLET | Freq: Every day | ORAL | Status: DC
  Administered 2018-11-18: 09:00:00 80 mg via ORAL
  Filled 2018-11-17: qty 4

## 2018-11-17 MED ORDER — LOSARTAN POTASSIUM 50 MG PO TABS
100.0000 mg | ORAL_TABLET | Freq: Every day | ORAL | Status: DC
  Administered 2018-11-18: 100 mg via ORAL
  Filled 2018-11-17 (×2): qty 2

## 2018-11-17 NOTE — ED Triage Notes (Signed)
Pt to ED via POV stating that he thinks that he had a stroke. Pt reports that on Wednesday he noticed that the grip in his left hand was decreased, pt states that he also noticed that he is dragging his left foot. Pt denies visual changes or speech changes. Pt states that his activity has been normal but he does feel like his gait is off. Pt is in NAD at this time, A & O x 4. No facial droop noted at this time.

## 2018-11-17 NOTE — TOC Initial Note (Signed)
Transition of Care [facility]) - Initial/Assessment Note    Patient Details  Name: Todd Potter MRN: [id number] Date of Birth: 07-30-1950  [facility] of [facility]) CM/SW Contact:    [name], RN Phone Number: 11/17/2018, 1:19 PM  Clinical Narrative:                 RNCM met with patient and his wife Todd Potter while in ED.  He is still complaining of left side weakness. MOON letter explained and delivered.  He states his PCP is helping him with diabetic medications due to cost- he states he has not had any problems obtaining thanks to PCP Dr. Lelon Potter.  He is right hand dominate and independent at baseline. He only has a can available for use at home.  TOC to follow for discharge needs. 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  He states he has T[employer] for Life but does not believe it is paying for his medications- I encouraged him to check on that to see if these medications would be less expensive.   Expected Discharge Plan: Home/Self Care     Patient Goals and CMS Choice        Expected Discharge Plan and Services Expected Discharge Plan: Home/Self Care                                              Prior Living Arrangements/Services                       Activities of Daily Living      Permission Sought/Granted                  Emotional Assessment              Admission diagnosis:  stroke like symptoms Patient Active Problem List   Diagnosis Date Noted  . CVA (cerebrovascular accident) ([place]) 11/17/2018  . Morbid obesity ([place]) 02/07/2015  . Knee pain 02/06/2015  . History of adenomatous polyp of colon 02/06/2015  . Acanthosis nigricans 02/06/2015  . Hyperlipidemia, mixed 01/12/2011  . 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 Pure hypercholesterolemia 03/13/2007  . Essential hypertension 03/13/2007  . Personal history of tobacco use 02/08/2007  . Cardiac enlargement 10/18/2006  .  Myocardial infarct, old 09/18/2006  . Controlled diabetes mellitus with renal manifestations ([place]) 09/18/2006  . CAD (coronary artery disease) 09/18/2006   PCP:  Todd Sons, MD [facility]   [id number] [id number]: [phone]: [phone] [id number]: [phone]: [phone]    Social Determinants of Health (SDOH) Interventions    Readmission Risk Interventions No flowsheet data found.

## 2018-11-17 NOTE — ED Notes (Signed)
Patient transported to Ultrasound 

## 2018-11-17 NOTE — H&P (Signed)
[facility] NAME: Todd Potter    MR#:  [id number]  DATE OF BIRTH:  [birth year]  DATE OF ADMISSION:  11/17/2018  PRIMARY CARE PHYSICIAN: [name], MD   REQUESTING/REFERRING PHYSICIAN: Dr. Cherylann Banas  CHIEF COMPLAINT:   Chief Complaint  Patient presents with   Weakness  Left sided weakness.   HISTORY OF PRESENT ILLNESS:  Todd Potter  is a 68 y.o. male with a known history of diabetes, hypertension, hyperlipidemia, osteoarthritis, previous history of MI, coronary artery disease who presents to the hospital due to left-sided weakness.  Patient says he developed left upper extremity weakness that began this past Wednesday and then he also started to drag his left foot, he thought this was more related to his right hip and knee pain but it has not improved and therefore came to the ER for further evaluation.  Patient denies any headache, blurry vision, nausea, vomiting, fever chills cough, chest pains, recent travel history loss of taste or any sick contacts.  Patient CT head is negative for acute pathology but given his persistent neurologic symptoms he is being admitted for work-up for stroke.  Patient's COVID-19 test is still pending.  PAST MEDICAL HISTORY:   Past Medical History:  Diagnosis Date   Acanthosis nigricans    CAD (coronary artery disease)    Cardiomegaly    Diabetes mellitus without complication ([place])    History of adenomatous polyp of colon    Hyperlipidemia    Hypertension    Knee pain    Myocardial infarct ([place])     PAST SURGICAL HISTORY:   Past Surgical History:  Procedure Laterality Date   CARDIAC CATHETERIZATION     Stent Placement x 5 to RCA    COLONOSCOPY WITH PROPOFOL N/A 08/06/2016   Procedure: COLONOSCOPY WITH PROPOFOL;  Surgeon: Manya Silvas, MD;  Location: [facility];  Service: Endoscopy;  Laterality: N/A;   CYSTECTOMY  1970&1990   x2 on tail bone    SOCIAL HISTORY:     Social History   Tobacco Use   Smoking status: Former Smoker    Packs/day: 0.50    Years: 30.00    Pack years: 15.00    Types: Cigarettes    Quit date: 04/19/2006    Years since quitting: 12.5   Smokeless tobacco: Never Used  Substance Use Topics   Alcohol use: Yes    Alcohol/week: 0.0 standard drinks    Comment: drinks 1-2 beer monthly    FAMILY HISTORY:   Family History  Problem Relation Age of Onset   Diabetes Mother    Diabetes Sister    Anuerysm Brother    Cancer Brother        brain   Diabetes Brother     DRUG ALLERGIES:  No Known Allergies  REVIEW OF SYSTEMS:   Review of Systems  Constitutional: Negative for fever and weight loss.  HENT: Negative for congestion, nosebleeds and tinnitus.   Eyes: Negative for blurred vision, double vision and redness.  Respiratory: Negative for cough, hemoptysis and shortness of breath.   Cardiovascular: Negative for chest pain, orthopnea, leg swelling and PND.  Gastrointestinal: Negative for abdominal pain, diarrhea, melena, nausea and vomiting.  Genitourinary: Negative for dysuria, hematuria and urgency.  Musculoskeletal: Negative for falls and joint pain.  Neurological: Positive for focal weakness (left upper ext and left foot). Negative for dizziness, tingling, sensory change, seizures, weakness and headaches.  Endo/Heme/Allergies: Negative for polydipsia. Does not bruise/bleed  easily.  Psychiatric/Behavioral: Negative for depression and memory loss. The patient is not nervous/anxious.     MEDICATIONS AT HOME:   Prior to Admission medications   Medication Sig Start Date End Date Taking? Authorizing Provider  amLODipine (NORVASC) 10 MG tablet Take 1 tablet (10 mg total) by mouth daily. 03/03/16  Yes [name], MD  aspirin 81 MG tablet Take 1 tablet by mouth daily. 11/18/06  Yes [provider]  atorvastatin (LIPITOR) 80 MG tablet TAKE 1 TABLET DAILY 09/07/18  Yes [name], MD  empagliflozin  (JARDIANCE) 25 MG TABS tablet TAKE 25 MG BY MOUTH DAILY. (NOT COVER- CONTACTING MD) 06/26/18  Yes Chrismon, Vickki Muff, PA  glipiZIDE (GLUCOTROL XL) 10 MG 24 hr tablet TAKE 1 TABLET DAILY 04/20/18  Yes [name], MD  hydrochlorothiazide (HYDRODIURIL) 25 MG tablet Take 1 tablet (25 mg total) by mouth daily. 03/03/16  Yes [name], MD  losartan (COZAAR) 100 MG tablet TAKE 1 TABLET BY MOUTH EVERY DAY 06/22/18  Yes [name], MD  metoprolol (TOPROL-XL) 200 MG 24 hr tablet TAKE 1 TABLET BY MOUTH EVERY DAY 09/15/18  Yes [name], MD  nitroGLYCERIN (NITROSTAT) 0.4 MG SL tablet Place 1 tablet under the tongue. 3-5 minutes x3 as nedded 11/14/09  Yes [provider]  sitaGLIPtin-metformin (JANUMET) 50-1000 MG tablet Take 1 tablet by mouth 2 (two) times daily. 06/26/18  Yes Chrismon, Vickki Muff, PA  tadalafil (CIALIS) 5 MG tablet Take 1 tablet (5 mg total) by mouth every other day as needed for erectile dysfunction. 08/25/18  Yes [name], MD      VITAL SIGNS:  Blood pressure 140/61, pulse 69, temperature 99.1 F (37.3 C), temperature source Oral, resp. rate 16, height 5' 6.5" (1.689 m), weight 106.1 kg, SpO2 96 %.  PHYSICAL EXAMINATION:  Physical Exam  GENERAL:  68 y.o.-year-old patient lying in the bed in NAD.   EYES: Pupils equal, round, reactive to light and accommodation. No scleral icterus. Extraocular muscles intact.  HEENT: Head atraumatic, normocephalic. Oropharynx and nasopharynx clear. No oropharyngeal erythema, moist oral mucosa  NECK:  Supple, no jugular venous distention. No thyroid enlargement, no tenderness.  LUNGS: Normal breath sounds bilaterally, no wheezing, rales, rhonchi. No use of accessory muscles of respiration.  CARDIOVASCULAR: S1, S2 RRR. No murmurs, rubs, gallops, clicks.  ABDOMEN: Soft, nontender, nondistended. Bowel sounds present. No organomegaly or mass.  EXTREMITIES: No pedal edema, cyanosis, or clubbing. + 2 pedal & radial pulses b/l.     NEUROLOGIC: Cranial nerves II through XII are intact.  Left upper extremity weakness 3 out of 5 strength, left lower extremity also 3 out of 5 strength.  No other focal motor or sensory deficits bilaterally  pSYCHIATRIC: The patient is alert and oriented x 3. Good affect.  SKIN: No obvious rash, lesion, or ulcer.   LABORATORY PANEL:   CBC Recent Labs  Lab 11/17/18 0904  WBC 11.1*  HGB 15.2  HCT 45.2  PLT 170   ------------------------------------------------------------------------------------------------------------------  Chemistries  Recent Labs  Lab 11/17/18 0904  NA 136  K 2.9*  CL 102  CO2 24  GLUCOSE 238*  BUN 15  CREATININE 1.25*  CALCIUM 9.0  AST 18  ALT 17  ALKPHOS 89  BILITOT 0.7   ------------------------------------------------------------------------------------------------------------------  Cardiac Enzymes No results for input(s): TROPONINI in the last 168 hours. ------------------------------------------------------------------------------------------------------------------  RADIOLOGY:  Ct Head Wo Contrast  Result Date: 11/17/2018 CLINICAL DATA:  Stroke suspected, weakness in the left  hand and left foot EXAM: CT HEAD WITHOUT CONTRAST TECHNIQUE: Contiguous axial images were obtained from the base of the skull through the vertex without intravenous contrast. COMPARISON:  None. FINDINGS: Brain: No evidence of acute infarction, hemorrhage, hydrocephalus, extra-axial collection or mass lesion/mass effect. Mild periventricular white matter hypodensity. Vascular: No hyperdense vessel or unexpected calcification. Skull: Normal. Negative for fracture or focal lesion. Sinuses/Orbits: No acute finding. Other: None. IMPRESSION: No acute intracranial pathology. No non-contrast CT evidence of acute stroke or hemorrhage. Mild small-vessel white matter disease. Consider MRI to further evaluate for acute diffusion restricting infarction if suspected. Electronically  Signed   By: Eddie Candle M.D.   On: 11/17/2018 10:47     IMPRESSION AND PLAN:   68 year old male with past medical history of known history of diabetes, hypertension, hyperlipidemia, osteoarthritis, previous history of MI, coronary artery disease who presents to the hospital due to left-sided weakness.  1.  Acute CVA- this is a suspected diagnosis given patient's left-sided weakness.  Patient CT head is negative for acute pathology. - We will admit the patient to the hospital, check MRI of the brain, carotid duplex, echocardiogram. -Continue aspirin, continue atorvastatin, will get PT, OT and speech evaluation. -We will also get a neurology consult.  2.  Essential hypertension- hemodynamically stable. -Continue losartan, metoprolol, amlodipine.  3.  Hypokalemia- we will give the patient oral potassium supplements.  Repeat level in the morning. -Check magnesium level.  4.  Diabetes type 2 without complication-continue glipizide, will place on sliding scale insulin. -Hold Januvia, metformin and Jardiance for now. -Placed on carb controlled diet.  5.  Hyperlipidemia-continue atorvastatin.    All the records are reviewed and case discussed with ED provider. Management plans discussed with the patient, family and they are in agreement.  CODE STATUS: Full code  TOTAL TIME TAKING CARE OF THIS PATIENT: 45 minutes.    Henreitta Leber M.D on 11/17/2018 at 1:25 PM  Between 7am to 6pm - Pager - [phone]  After 6pm go to [email or web address] - password [email or web address] [facility]  Office  [phone]  CC: Primary care physician; [name], MD

## 2018-11-17 NOTE — ED Provider Notes (Signed)
[facility] Emergency Department Provider Note ____________________________________________   First MD Initiated Contact with Patient 11/17/18 [phone]     (approximate)  I have reviewed the triage vital signs and the nursing notes.   HISTORY  Chief Complaint Weakness    HPI Todd Potter is a 68 y.o. male with PMH as noted below who presents with left-sided weakness, acute onset 2 days ago and persistent since then.  The patient initially noticed decreased grip strength on the left hand and problems controlling his left arm, and subsequently noted that his left foot was dragging.  He initially thought that it could be related to compensation for chronic right hip pain but then became worried he may have had a stroke.  The patient denies any associated visual disturbance, speech problems, or any other weakness or numbness.  He denies any trauma.  He has not had any recent illness.  He has no prior stroke history.   Past Medical History:  Diagnosis Date  . Acanthosis nigricans   . CAD (coronary artery disease)   . Cardiomegaly   . Diabetes mellitus without complication ([place])   . History of adenomatous polyp of colon   . Hyperlipidemia   . Hypertension   . Knee pain   . Myocardial infarct [facility])     Patient Active Problem List   Diagnosis Date Noted  . Morbid obesity ([place]) 02/07/2015  . Knee pain 02/06/2015  . History of adenomatous polyp of colon 02/06/2015  . Acanthosis nigricans 02/06/2015  . Hyperlipidemia, mixed 01/12/2011  . 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 Pure hypercholesterolemia 03/13/2007  . Essential hypertension 03/13/2007  . Personal history of tobacco use 02/08/2007  . Cardiac enlargement 10/18/2006  . Myocardial infarct, old 09/18/2006  . 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 CAD (coronary artery disease) 09/18/2006    Past Surgical History:  Procedure Laterality Date  . CARDIAC  CATHETERIZATION     Stent Placement x 5 to RCA   . COLONOSCOPY WITH PROPOFOL N/A 08/06/2016   Procedure: COLONOSCOPY WITH PROPOFOL;  Surgeon: Manya Silvas, MD;  [facility] ENDOSCOPY;  Service: Endoscopy;  Laterality: N/A;  . CYSTECTOMY  1970&1990   x2 on tail bone    Prior to Admission medications   Medication Sig Start Date End Date Taking? Authorizing Provider  amLODipine (NORVASC) 10 MG tablet Take 1 tablet (10 mg total) by mouth daily. 03/03/16   [name], MD  aspirin 81 MG tablet Take 1 tablet by mouth daily. 11/18/06   [provider]  atorvastatin (LIPITOR) 80 MG tablet TAKE 1 TABLET DAILY 09/07/18   [name], MD  empagliflozin (JARDIANCE) 25 MG TABS tablet TAKE 25 MG BY MOUTH DAILY. (NOT COVER- CONTACTING MD) 06/26/18   Chrismon, Vickki Muff, PA  glipiZIDE (GLUCOTROL XL) 10 MG 24 hr tablet TAKE 1 TABLET DAILY 04/20/18   [name], MD  hydrochlorothiazide (HYDRODIURIL) 25 MG tablet Take 1 tablet (25 mg total) by mouth daily. 03/03/16   [name], MD  losartan (COZAAR) 100 MG tablet TAKE 1 TABLET BY MOUTH EVERY DAY 06/22/18   [name], MD  metoprolol (TOPROL-XL) 200 MG 24 hr tablet TAKE 1 TABLET BY MOUTH EVERY DAY 09/15/18   [name], MD  nitroGLYCERIN (NITROSTAT) 0.4 MG SL tablet Place 1 tablet under the tongue. 3-5 minutes x3 as nedded 11/14/09   [provider]  sildenafil (VIAGRA) 100 MG tablet Take by mouth.  1/2-1 tablet as needed, no more than 1 a day 12/22/12   [provider]  sitaGLIPtin-metformin (JANUMET) 50-1000 MG tablet Take 1 tablet by mouth 2 (two) times daily. 06/26/18   Chrismon, Vickki Muff, PA  tadalafil (CIALIS) 5 MG tablet Take 1 tablet (5 mg total) by mouth every other day as needed for erectile dysfunction. 08/25/18   [name], MD    Allergies Patient has no known allergies.  Family History  Problem Relation Age of Onset  . Diabetes Mother   . Diabetes Sister   . Anuerysm Brother   . Cancer  Brother        brain  . Diabetes Brother     Social History Social History   Tobacco Use  . Smoking status: Former Smoker    Quit date: 04/19/2006    Years since quitting: 12.5  . Smokeless tobacco: Never Used  Substance Use Topics  . Alcohol use: Yes    Alcohol/week: 0.0 standard drinks    Comment: drinks 1-2 beer monthly  . Drug use: No    Review of Systems  Constitutional: No fever/chills Eyes: No visual changes. ENT: No sore throat. Cardiovascular: Denies chest pain. Respiratory: Denies shortness of breath. Gastrointestinal: No vomiting or diarrhea.  Genitourinary: Negative for dysuria.  Musculoskeletal: Negative for back pain. Skin: Negative for rash. Neurological: Negative for headaches.  Positive for left arm and leg weakness.   ____________________________________________   PHYSICAL EXAM:  VITAL SIGNS: ED Triage Vitals  Enc Vitals Group     BP 11/17/18 0857 140/61     Pulse Rate 11/17/18 0857 69     Resp 11/17/18 0857 16     Temp 11/17/18 0857 99.1 F (37.3 C)     Temp Source 11/17/18 0857 Oral     SpO2 11/17/18 0857 96 %     Weight 11/17/18 0858 234 lb (106.1 kg)     Height 11/17/18 0858 5' 6.5" (1.689 m)     Head Circumference --      Peak Flow --      Pain Score 11/17/18 0858 0     Pain Loc --      Pain Edu? --      Excl. in [place]? --     Constitutional: Alert and oriented. Well appearing and in no acute distress. Eyes: Conjunctivae are normal.  EOMI.  PERRLA. Head: Atraumatic. Nose: No congestion/rhinnorhea. Mouth/Throat: Mucous membranes are moist.   Neck: Normal range of motion.  Cardiovascular: Normal rate, regular rhythm. Good peripheral circulation. Respiratory: Normal respiratory effort.  No retractions.  Gastrointestinal: No distention.  Musculoskeletal: Extremities warm and well perfused.  Neurologic:  Normal speech and language.  Cranial nerves II through XII grossly intact.  4/5 grip strength to the left upper extremity, and  decreased strength in plantar flexion to the left lower extremity.  Mild left upper extremity ataxia. Skin:  Skin is warm and dry. No rash noted. Psychiatric: Mood and affect are normal. Speech and behavior are normal.  ____________________________________________   LABS (all labs ordered are listed, but only abnormal results are displayed)  Labs Reviewed  CBC - Abnormal; Notable for the following components:      Result Value   WBC 11.1 (*)    All other components within normal limits  DIFFERENTIAL - Abnormal; Notable for the following components:   Monocytes Absolute 1.2 (*)    All other components within normal limits  COMPREHENSIVE METABOLIC PANEL - Abnormal; Notable for the following components:  Potassium 2.9 (*)    Glucose, Bld 238 (*)    Creatinine, Ser 1.25 (*)    GFR calc non Af Amer 58 (*)    All other components within normal limits  SARS CORONAVIRUS 2 (HOSPITAL ORDER, [place] [facility])  PROTIME-INR  APTT  CBG MONITORING, ED  TROPONIN I (HIGH SENSITIVITY)  TROPONIN I (HIGH SENSITIVITY)   ____________________________________________  EKG  ED ECG REPORT I, Arta Silence, the attending physician, personally viewed and interpreted this ECG.  Date: 11/17/2018 EKG Time: 100 Rate: 68 Rhythm: normal sinus rhythm QRS Axis: normal Intervals: normal ST/T Wave abnormalities: Nonspecific T wave abnormalities inferiorly Narrative Interpretation: Nonspecific T wave abnormalities with no evidence of acute ischemia  ____________________________________________  RADIOLOGY  CT head: No acute abnormality  ____________________________________________   PROCEDURES  Procedure(s) performed: No  Procedures  Critical Care performed: No ____________________________________________   INITIAL IMPRESSION / ASSESSMENT AND PLAN / ED COURSE  Pertinent labs & imaging results that were available during my care of the patient were reviewed by me  and considered in my medical decision making (see chart for details).  68 year old male with PMH as noted above and no prior stroke history presents with left-sided weakness which he noted 2 days ago.  On exam, the patient is overall relatively well-appearing.  His vital signs are normal.  Exam is significant for left-sided upper and lower extremity deficits as above.  Based on these findings the patient's NIH stroke scale would be 3-4.  The remainder of the exam is unremarkable.  I reviewed the past medical records in [place].  The patient has no ED visits or admissions within the last 2 years.  He has a history of CAD with an MI, but no prior stroke history.  Overall presentation is concerning for a small CVA likely occurring 2 days ago.  The patient is out of the window for tPA or other intervention.  We will obtain CT head and lab work-up.  I anticipate admission for stroke work-up.  ----------------------------------------- 12:30 PM on 11/17/2018 -----------------------------------------  CT head shows no acute findings.  The patient has mild hypokalemia but no other significant lab abnormalities.  We will admit for stroke work-up.  I signed the patient out to the hospitalist Dr. Verdell Carmine. _________________________________  Teena Irani was evaluated in Emergency Department on 11/17/2018 for the symptoms described in the history of present illness. He was evaluated in the context of the global COVID-19 pandemic, which necessitated consideration that the patient might be at risk for infection with the SARS-CoV-2 virus that causes COVID-19. Institutional protocols and algorithms that pertain to the evaluation of patients at risk for COVID-19 are in a state of rapid change based on information released by regulatory bodies including the CDC and federal and state organizations. These policies and algorithms were followed during the patient's care in the ED.   ____________________________________________   FINAL CLINICAL IMPRESSION(S) / ED DIAGNOSES  Final diagnoses:  Cerebrovascular accident (CVA), unspecified mechanism ([place])      NEW MEDICATIONS STARTED DURING THIS VISIT:  New Prescriptions   No medications on file     Note:  This document was prepared using Dragon voice recognition software and may include unintentional dictation errors.    Arta Silence, MD 11/17/18 1231

## 2018-11-17 NOTE — ED Notes (Signed)
ED TO INPATIENT HANDOFF REPORT  ED Nurse Name and Phone #:  Maudie Mercury [phone]  S Name/Age/Gender Todd Potter 68 y.o. male Room/Bed: ED15A/ED15A  Code Status   Code Status: Not on file  Home/SNF/Other Home Patient oriented to: self, place, time and situation Is this baseline? Yes   Triage Complete: Triage complete  Chief Complaint stroke like symptoms  Triage Note Pt to ED via [place] stating that he thinks that he had a stroke. Pt reports that on Wednesday he noticed that the grip in his left hand was decreased, pt states that he also noticed that he is dragging his left foot. Pt denies visual changes or speech changes. Pt states that his activity has been normal but he does feel like his gait is off. Pt is in NAD at this time, A & O x 4. No facial droop noted at this time.    Allergies No Known Allergies  Level of Care/Admitting Diagnosis ED Disposition    ED Disposition Condition [place] [facility] Area: [place] [[id number]]  Level of Care: Med-Surg [16]  Covid Evaluation: Person Under Investigation (PUI)  Diagnosis: CVA (cerebrovascular accident) [facility]) [[id number]]  Admitting Physician: Henreitta Leber [[id number]]  Attending Physician: Henreitta Leber [[id number]]  PT Class (Do Not Modify): Observation [104]  PT Acc Code (Do Not Modify): Observation [10022]       B Medical/Surgery History Past Medical History:  Diagnosis Date  . Acanthosis nigricans   . CAD (coronary artery disease)   . Cardiomegaly   . Diabetes mellitus without complication ([place])   . History of adenomatous polyp of colon   . Hyperlipidemia   . Hypertension   . Knee pain   . Myocardial infarct [facility])    Past Surgical History:  Procedure Laterality Date  . CARDIAC CATHETERIZATION     Stent Placement x 5 to RCA   . COLONOSCOPY WITH PROPOFOL N/A 08/06/2016   Procedure: COLONOSCOPY WITH PROPOFOL;  Surgeon: Manya Silvas, MD;  Location: [facility] ENDOSCOPY;  Service: Endoscopy;   Laterality: N/A;  . CYSTECTOMY  1970&1990   x2 on tail bone     A IV Location/Drains/Wounds Patient Lines/Drains/Airways Status   Active Line/Drains/Airways    Name:   Placement date:   Placement time:   Site:   Days:   Peripheral IV 11/17/18 Left Antecubital   11/17/18    0930    Antecubital   less than 1          Intake/Output Last 24 hours No intake or output data in the 24 hours ending 11/17/18 1510  Labs/Imaging Results for orders placed or performed during the hospital encounter of 11/17/18 (from the past 48 hour(s))  Protime-INR     Status: None   Collection Time: 11/17/18  9:04 AM  Result Value Ref Range   Prothrombin Time 12.8 11.4 - 15.2 seconds   INR 1.0 0.8 - 1.2    Comment: (NOTE) INR goal varies based on device and disease states. Performed at [facility], [address] [place], [place] [address]   APTT     Status: None   Collection Time: 11/17/18  9:04 AM  Result Value Ref Range   aPTT 29 24 - 36 seconds    Comment: Performed at [facility], [address] [place], [place] [address]  CBC     Status: Abnormal   Collection Time: 11/17/18  9:04 AM  Result Value Ref Range   WBC 11.1 (  H) 4.0 - 10.5 K/uL   RBC 5.25 4.22 - 5.81 MIL/uL   Hemoglobin 15.2 13.0 - 17.0 g/dL   HCT 45.2 39.0 - 52.0 %   MCV 86.1 80.0 - 100.0 fL   MCH 29.0 26.0 - 34.0 pg   MCHC 33.6 30.0 - 36.0 g/dL   RDW 15.1 11.5 - 15.5 %   Platelets 170 150 - 400 K/uL   nRBC 0.0 0.0 - 0.2 %    Comment: Performed at [facility], [address] [place], [place] [address]  Differential     Status: Abnormal   Collection Time: 11/17/18  9:04 AM  Result Value Ref Range   Neutrophils Relative % 63 %   Neutro Abs 7.0 1.7 - 7.7 K/uL   Lymphocytes Relative 21 %   Lymphs Abs 2.4 0.7 - 4.0 K/uL   Monocytes Relative 11 %   Monocytes Absolute 1.2 (H) 0.1 - 1.0 K/uL   Eosinophils Relative 5 %   Eosinophils Absolute 0.5 0.0 - 0.5 K/uL   Basophils Relative 0 %    Basophils Absolute 0.0 0.0 - 0.1 K/uL   Immature Granulocytes 0 %   Abs Immature Granulocytes 0.04 0.00 - 0.07 K/uL    Comment: Performed at [facility], [address] [place], [place] [address]  Comprehensive metabolic panel     Status: Abnormal   Collection Time: 11/17/18  9:04 AM  Result Value Ref Range   Sodium 136 135 - 145 mmol/L   Potassium 2.9 (L) 3.5 - 5.1 mmol/L   Chloride 102 98 - 111 mmol/L   CO2 24 22 - 32 mmol/L   Glucose, Bld 238 (H) 70 - 99 mg/dL   BUN 15 8 - 23 mg/dL   Creatinine, Ser 1.25 (H) 0.61 - 1.24 mg/dL   Calcium 9.0 8.9 - 10.3 mg/dL   Total Protein 7.9 6.5 - 8.1 g/dL   Albumin 3.8 3.5 - 5.0 g/dL   AST 18 15 - 41 U/L   ALT 17 0 - 44 U/L   Alkaline Phosphatase 89 38 - 126 U/L   Total Bilirubin 0.7 0.3 - 1.2 mg/dL   GFR calc non Af Amer 58 (L) >60 mL/min   GFR calc Af Amer >60 >60 mL/min   Anion gap 10 5 - 15    Comment: Performed at [facility], [place], [place] [address]  Troponin I (High Sensitivity)     Status: None   Collection Time: 11/17/18  9:04 AM  Result Value Ref Range   Troponin I (High Sensitivity) 15 <18 ng/L    Comment: (NOTE) Elevated high sensitivity troponin I (hsTnI) values and significant  changes across serial measurements may suggest ACS but many other  chronic and acute conditions are known to elevate hsTnI results.  Refer to the "Links" section for chest pain algorithms and additional  guidance. Performed at [facility], [address] [place], [place] [address]   SARS Coronavirus 2 [facility] order, Performed in [facility] hospital lab) Nasopharyngeal Nasopharyngeal Swab     Status: None   Collection Time: 11/17/18  1:18 PM   Specimen: Nasopharyngeal Swab  Result Value Ref Range   SARS Coronavirus 2 NEGATIVE NEGATIVE    Comment: (NOTE) If result is NEGATIVE SARS-CoV-2 target nucleic acids are NOT DETECTED. The SARS-CoV-2 RNA is generally detectable in upper and lower   respiratory specimens during the acute phase of infection. The lowest  concentration of SARS-CoV-2 viral copies this assay can  detect is 250  copies / mL. A negative result does not preclude SARS-CoV-2 infection  and should not be used as the sole basis for treatment or other  patient management decisions.  A negative result may occur with  improper specimen collection / handling, submission of specimen other  than nasopharyngeal swab, presence of viral mutation(s) within the  areas targeted by this assay, and inadequate number of viral copies  (<250 copies / mL). A negative result must be combined with clinical  observations, patient history, and epidemiological information. If result is POSITIVE SARS-CoV-2 target nucleic acids are DETECTED. The SARS-CoV-2 RNA is generally detectable in upper and lower  respiratory specimens dur ing the acute phase of infection.  Positive  results are indicative of active infection with SARS-CoV-2.  Clinical  correlation with patient history and other diagnostic information is  necessary to determine patient infection status.  Positive results do  not rule out bacterial infection or co-infection with other viruses. If result is PRESUMPTIVE POSTIVE SARS-CoV-2 nucleic acids MAY BE PRESENT.   A presumptive positive result was obtained on the submitted specimen  and confirmed on repeat testing.  While 2019 novel coronavirus  (SARS-CoV-2) nucleic acids may be present in the submitted sample  additional confirmatory testing may be necessary for epidemiological  and / or clinical management purposes  to differentiate between  SARS-CoV-2 and other Sarbecovirus currently known to infect humans.  If clinically indicated additional testing with an alternate test  methodology [phone]) is advised. The SARS-CoV-2 RNA is generally  detectable in upper and lower respiratory sp ecimens during the acute  phase of infection. The expected result is Negative. Fact  Sheet for Patients:  StrictlyIdeas.no Fact Sheet for Healthcare Providers: BankingDealers.co.za This test is not yet approved or cleared by the [place] FDA and has been authorized for detection and/or diagnosis of SARS-CoV-2 by FDA under an Emergency Use Authorization (EUA).  This EUA will remain in effect (meaning this test can be used) for the duration of the COVID-19 declaration under Section 564(b)(1) of the Act, 21 U.S.C. section 360bbb-3(b)(1), unless the authorization is terminated or revoked sooner. Performed at [facility], [place], [place] [address]   Troponin I (High Sensitivity)     Status: None   Collection Time: 11/17/18  1:18 PM  Result Value Ref Range   Troponin I (High Sensitivity) 14 <18 ng/L    Comment: (NOTE) Elevated high sensitivity troponin I (hsTnI) values and significant  changes across serial measurements may suggest ACS but many other  chronic and acute conditions are known to elevate hsTnI results.  Refer to the "Links" section for chest pain algorithms and additional  guidance. Performed at [facility], [address] [place], [place] [address]   Magnesium     Status: Abnormal   Collection Time: 11/17/18  1:18 PM  Result Value Ref Range   Magnesium 1.6 (L) 1.7 - 2.4 mg/dL    Comment: Performed at [facility], [place], [place] [address]   Ct Head Wo Contrast  Result Date: 11/17/2018 CLINICAL DATA:  Stroke suspected, weakness in the left hand and left foot EXAM: CT HEAD WITHOUT CONTRAST TECHNIQUE: Contiguous axial images were obtained from the base of the skull through the vertex without intravenous contrast. COMPARISON:  None. FINDINGS: Brain: No evidence of acute infarction, hemorrhage, hydrocephalus, extra-axial collection or mass lesion/mass effect. Mild periventricular white matter hypodensity. Vascular: No hyperdense vessel or  unexpected calcification. Skull: Normal. Negative  for fracture or focal lesion. Sinuses/Orbits: No acute finding. Other: None. IMPRESSION: No acute intracranial pathology. No non-contrast CT evidence of acute stroke or hemorrhage. Mild small-vessel white matter disease. Consider MRI to further evaluate for acute diffusion restricting infarction if suspected. Electronically Signed   By: Eddie Candle M.D.   On: 11/17/2018 10:47    Pending Labs [employer] (From admission, onward)    Start     Ordered   Signed and Held  HIV antibody (Routine Testing)  Once,   R     Signed and Held   Signed and Held  Hemoglobin A1c  Tomorrow morning,   R     Signed and Held   Signed and Held  Lipid panel  Tomorrow morning,   R    Comments: Fasting    Signed and Held   Signed and Held  CBC  (enoxaparin (LOVENOX)    CrCl >/= 30 ml/min)  Once,   R    Comments: Baseline for enoxaparin therapy IF NOT ALREADY DRAWN.  Notify MD if PLT < 100 K.    Signed and Held   Signed and Held  Creatinine, serum  (enoxaparin (LOVENOX)    CrCl >/= 30 ml/min)  Once,   R    Comments: Baseline for enoxaparin therapy IF NOT ALREADY DRAWN.    Signed and Held   Signed and Held  Creatinine, serum  (enoxaparin (LOVENOX)    CrCl >/= 30 ml/min)  Weekly,   R    Comments: while on enoxaparin therapy    Signed and Held          Vitals/Pain Today's Vitals   11/17/18 0857 11/17/18 0858 11/17/18 0930 11/17/18 0933  BP: 140/61  (!) 144/67   Pulse: 69  65   Resp: 16  16   Temp: 99.1 F (37.3 C)     TempSrc: Oral     SpO2: 96%  96%   Weight:  106.1 kg    Height:  5' 6.5" (1.689 m)    PainSc:  0-No pain  0-No pain    Isolation Precautions No active isolations  Medications Medications  aspirin EC tablet 325 mg (325 mg Oral Given 11/17/18 1329)  potassium chloride SA (K-DUR) CR tablet 40 mEq (has no administration in time range)    Mobility walks Low fall risk   Focused Assessments Neuro Assessment Handoff:  Swallow  screen pass? Yes    NIH Stroke Scale ( + Modified Stroke Scale Criteria)  Interval: Initial Level of Consciousness (1a.)   : Alert, keenly responsive LOC Questions (1b. )   +: Answers both questions correctly LOC Commands (1c. )   + : Performs both tasks correctly Best Gaze (2. )  +: Normal Visual (3. )  +: No visual loss Facial Palsy (4. )    : Normal symmetrical movements Motor Arm, Left (5a. )   +: Drift Motor Arm, Right (5b. )   +: No drift Motor Leg, Left (6a. )   +: Drift Motor Leg, Right (6b. )   +: No drift Limb Ataxia (7. ): Absent Sensory (8. )   +: Normal, no sensory loss Best Language (9. )   +: No aphasia Dysarthria (10. ): Normal Extinction/Inattention (11.)   +: No Abnormality Modified SS Total  +: 2 Complete NIHSS TOTAL: 2     Neuro Assessment: Within Defined Limits(A&O x4) Neuro Checks:   Initial (11/17/18 0932)  Last Documented NIHSS Modified Score: 2 (11/17/18 0932) Has TPA been  given? No   If patient is a Neuro Trauma and patient is going to OR before floor call report to [place] nurse: [phone] or [phone]     R Recommendations: See Admitting Provider Note  Report given to:   Additional Notes:

## 2018-11-17 NOTE — Progress Notes (Signed)
OT Cancellation Note  Patient Details Name: Todd Potter MRN: [id number] DOB: 1950/05/17   Cancelled Treatment:    Reason Eval/Treat Not Completed: Patient at procedure or test/ unavailable. Consult received, chart reviewed. Pt out for testing, still in ED. Will re-attempt next date as appropriate.   Jeni Salles, MPH, MS, OTR/L ascom [phone] 11/17/18, 3:56 PM

## 2018-11-17 NOTE — Care Management Obs Status (Signed)
[facility] NOTIFICATION   Patient Details  Name: MASSIE MEES MRN: [id number] Date of Birth: 1950/08/30   Medicare Observation Status Notification Given:  Yes    [name], RN 11/17/2018, 1:18 PM

## 2018-11-17 NOTE — Consult Note (Signed)
Reason for Consult: L sided weakness  Referring Physician: Dr. Verdell Carmine  CC: L sided weakness   HPI: Todd Potter is an 68 y.o. male  With hx of HTN, DM, MI in 2008 presents with left-sided weakness, acute onset 2 days ago and persistent since then.  The patient initially noticed decreased grip strength on the left hand and problems controlling his left arm, and subsequently noted that his left foot was dragging. No prior hx of similar symptoms in past.  On ASA 81 mg at home daily.    Past Medical History:  Diagnosis Date  . Acanthosis nigricans   . CAD (coronary artery disease)   . Cardiomegaly   . Diabetes mellitus without complication ([place])   . History of adenomatous polyp of colon   . Hyperlipidemia   . Hypertension   . Knee pain   . Myocardial infarct [facility])     Past Surgical History:  Procedure Laterality Date  . CARDIAC CATHETERIZATION     Stent Placement x 5 to RCA   . COLONOSCOPY WITH PROPOFOL N/A 08/06/2016   Procedure: COLONOSCOPY WITH PROPOFOL;  Surgeon: Manya Silvas, MD;  Location: [facility] ENDOSCOPY;  Service: Endoscopy;  Laterality: N/A;  . CYSTECTOMY  1970&1990   x2 on tail bone    Family History  Problem Relation Age of Onset  . Diabetes Mother   . Diabetes Sister   . Anuerysm Brother   . Cancer Brother        brain  . Diabetes Brother     Social History:  reports that he quit smoking about 12 years ago. His smoking use included cigarettes. He has a 15.00 pack-year smoking history. He has never used smokeless tobacco. He reports current alcohol use. He reports that he does not use drugs.  No Known Allergies  Medications: I have reviewed the patient's current medications.  ROS: History obtained from the patient  General ROS: negative for - chills, fatigue, fever, night sweats, weight gain or weight loss Psychological ROS: negative for - behavioral disorder, hallucinations, memory difficulties, mood swings or suicidal ideation Ophthalmic ROS: negative  for - blurry vision, double vision, eye pain or loss of vision ENT ROS: negative for - epistaxis, nasal discharge, oral lesions, sore throat, tinnitus or vertigo Allergy and Immunology ROS: negative for - hives or itchy/watery eyes Hematological and Lymphatic ROS: negative for - bleeding problems, bruising or swollen lymph nodes Endocrine ROS: negative for - galactorrhea, hair pattern changes, polydipsia/polyuria or temperature intolerance Respiratory ROS: negative for - cough, hemoptysis, shortness of breath or wheezing Cardiovascular ROS: negative for - chest pain, dyspnea on exertion, edema or irregular heartbeat Gastrointestinal ROS: negative for - abdominal pain, diarrhea, hematemesis, nausea/vomiting or stool incontinence Genito-Urinary ROS: negative for - dysuria, hematuria, incontinence or urinary frequency/urgency Musculoskeletal ROS: negative for - joint swelling or muscular weakness Neurological ROS: as noted in HPI Dermatological ROS: negative for rash and skin lesion changes  Physical Examination: Blood pressure 140/61, pulse 69, temperature 99.1 F (37.3 C), temperature source Oral, resp. rate 16, height 5' 6.5" (1.689 m), weight 106.1 kg, SpO2 96 %.   Neurological Examination   Mental Status: Alert, oriented, thought content appropriate.  Speech fluent without evidence of aphasia.  Able to follow 3 step commands without difficulty. Cranial Nerves: II: Discs flat bilaterally; Visual fields grossly normal, pupils equal, round, reactive to light and accommodation III,IV, VI: ptosis not present, extra-ocular motions intact bilaterally V,VII: smile symmetric, facial light touch sensation normal bilaterally VIII: hearing normal  bilaterally IX,X: gag reflex present XI: bilateral shoulder shrug XII: midline tongue extension Motor: Right : Upper extremity   5/5    Left:     Upper extremity   4/5  Lower extremity   5/5     Lower extremity   4/5 Tone and bulk:normal tone  throughout; no atrophy noted Sensory: Pinprick and light touch intact throughout, bilaterally Deep Tendon Reflexes: 1+ and symmetric throughout Plantars: Right: downgoing   Left: downgoing Cerebellar: normal finger to nose Gait: not tested      Laboratory Studies:   Basic Metabolic Panel: Recent Labs  Lab 11/17/18 0904  NA 136  K 2.9*  CL 102  CO2 24  GLUCOSE 238*  BUN 15  CREATININE 1.25*  CALCIUM 9.0    Liver Function Tests: Recent Labs  Lab 11/17/18 0904  AST 18  ALT 17  ALKPHOS 89  BILITOT 0.7  PROT 7.9  ALBUMIN 3.8   No results for input(s): LIPASE, AMYLASE in the last 168 hours. No results for input(s): AMMONIA in the last 168 hours.  CBC: Recent Labs  Lab 11/17/18 0904  WBC 11.1*  NEUTROABS 7.0  HGB 15.2  HCT 45.2  MCV 86.1  PLT 170    Cardiac Enzymes: No results for input(s): CKTOTAL, CKMB, CKMBINDEX, TROPONINI in the last 168 hours.  BNP: Invalid input(s): POCBNP  CBG: No results for input(s): GLUCAP in the last 168 hours.  Microbiology: No results found for this or any previous visit.  Coagulation Studies: Recent Labs    11/17/18 0904  LABPROT 12.8  INR 1.0    Urinalysis: No results for input(s): COLORURINE, LABSPEC, PHURINE, GLUCOSEU, HGBUR, BILIRUBINUR, KETONESUR, PROTEINUR, UROBILINOGEN, NITRITE, LEUKOCYTESUR in the last 168 hours.  Invalid input(s): APPERANCEUR  Lipid Panel:     Component Value Date/Time   CHOL 125 03/02/2018 0853   TRIG 94 03/02/2018 0853   HDL 40 03/02/2018 0853   CHOLHDL 3.1 03/02/2018 0853   LDLCALC 66 03/02/2018 0853    HgbA1C:  Lab Results  Component Value Date   HGBA1C 7.4 (A) 08/25/2018    Urine Drug Screen:  No results found for: LABOPIA, COCAINSCRNUR, LABBENZ, AMPHETMU, THCU, LABBARB  Alcohol Level: No results for input(s): ETH in the last 168 hours.  Other results: EKG: normal EKG, normal sinus rhythm, unchanged from previous tracings.  Imaging: Ct Head Wo Contrast  Result  Date: 11/17/2018 CLINICAL DATA:  Stroke suspected, weakness in the left hand and left foot EXAM: CT HEAD WITHOUT CONTRAST TECHNIQUE: Contiguous axial images were obtained from the base of the skull through the vertex without intravenous contrast. COMPARISON:  None. FINDINGS: Brain: No evidence of acute infarction, hemorrhage, hydrocephalus, extra-axial collection or mass lesion/mass effect. Mild periventricular white matter hypodensity. Vascular: No hyperdense vessel or unexpected calcification. Skull: Normal. Negative for fracture or focal lesion. Sinuses/Orbits: No acute finding. Other: None. IMPRESSION: No acute intracranial pathology. No non-contrast CT evidence of acute stroke or hemorrhage. Mild small-vessel white matter disease. Consider MRI to further evaluate for acute diffusion restricting infarction if suspected. Electronically Signed   By: Eddie Candle M.D.   On: 11/17/2018 10:47     Assessment/Plan:  68 y.o. male  With hx of HTN, DM, MI in 2008 presents with left-sided weakness, acute onset 2 days ago and persistent since then.  The patient initially noticed decreased grip strength on the left hand and problems controlling his left arm, and subsequently noted that his left foot was dragging. No prior hx of similar symptoms in  past.  On ASA 81 mg at home daily.    - CtH no acute abnormalities - ASA 325 daily - MRI/ MRA head and neck - Pt/ot - suspect subcortical ischemia on right side given the risk factors.    11/17/2018, 12:58 PM

## 2018-11-17 NOTE — Telephone Encounter (Signed)
Patient called stating he thought he was having a stroke.  He stated he was having trouble walking and numbness on one side.  He was advised to go to the ER.  Patient verbalized that he understood and would do so.

## 2018-11-18 ENCOUNTER — Observation Stay (HOSPITAL_BASED_OUTPATIENT_CLINIC_OR_DEPARTMENT_OTHER)
Admit: 2018-11-18 | Discharge: 2018-11-18 | Disposition: A | Discharge status: Home / Self Care | Payer: Medicare Other | Attending: Specialist | Admitting: Specialist

## 2018-11-18 DIAGNOSIS — I639 Cerebral infarction, unspecified: Secondary | ICD-10-CM

## 2018-11-18 DIAGNOSIS — I10 Essential (primary) hypertension: Secondary | ICD-10-CM | POA: Diagnosis not present

## 2018-11-18 DIAGNOSIS — E119 Type 2 diabetes mellitus without complications: Secondary | ICD-10-CM | POA: Diagnosis not present

## 2018-11-18 DIAGNOSIS — E876 Hypokalemia: Secondary | ICD-10-CM | POA: Diagnosis not present

## 2018-11-18 LAB — LIPID PANEL
Cholesterol: 120 mg/dL (ref 0–200)
HDL: 31 mg/dL — ABNORMAL LOW (ref >40)
LDL Cholesterol: 71 mg/dL (ref 0–99)
Total CHOL/HDL Ratio: 3.9 RATIO
Triglycerides: 91 mg/dL (ref <150)
VLDL: 18 mg/dL (ref 0–40)

## 2018-11-18 LAB — ECHOCARDIOGRAM COMPLETE
Height: 66.5 in
Weight: 3744 oz

## 2018-11-18 LAB — BASIC METABOLIC PANEL
Anion gap: 9 (ref 5–15)
BUN: 11 mg/dL (ref 8–23)
CO2: 27 mmol/L (ref 22–32)
Calcium: 9.2 mg/dL (ref 8.9–10.3)
Chloride: 104 mmol/L (ref 98–111)
Creatinine, Ser: 1.06 mg/dL (ref 0.61–1.24)
GFR calc Af Amer: >60 mL/min (ref >60)
GFR calc non Af Amer: >60 mL/min (ref >60)
Glucose, Bld: 139 mg/dL — ABNORMAL HIGH (ref 70–99)
Potassium: 3.3 mmol/L — ABNORMAL LOW (ref 3.5–5.1)
Sodium: 140 mmol/L (ref 135–145)

## 2018-11-18 LAB — HEMOGLOBIN A1C
Hgb A1c MFr Bld: 7.1 % — ABNORMAL HIGH (ref 4.8–5.6)
Mean Plasma Glucose: 157.07 mg/dL

## 2018-11-18 LAB — GLUCOSE, CAPILLARY: Glucose-Capillary: 162 mg/dL — ABNORMAL HIGH (ref 70–99)

## 2018-11-18 LAB — MAGNESIUM: Magnesium: 1.8 mg/dL (ref 1.7–2.4)

## 2018-11-18 MED ORDER — MAGNESIUM OXIDE 400 (241.3 MG) MG PO TABS
400.0000 mg | ORAL_TABLET | Freq: Once | ORAL | Status: AC
  Administered 2018-11-18: 400 mg via ORAL
  Filled 2018-11-18: qty 1

## 2018-11-18 MED ORDER — MAGNESIUM OXIDE 400 (241.3 MG) MG PO TABS: 400.0000 mg | ORAL_TABLET | Freq: Every day | ORAL | 0 refills | Status: DC

## 2018-11-18 MED ORDER — ASPIRIN 325 MG PO TBEC: 325.0000 mg | DELAYED_RELEASE_TABLET | Freq: Every day | ORAL | 0 refills | Status: DC

## 2018-11-18 MED ORDER — POTASSIUM CHLORIDE CRYS ER 20 MEQ PO TBCR: 20.0000 meq | EXTENDED_RELEASE_TABLET | Freq: Every day | ORAL | 0 refills | Status: DC

## 2018-11-18 MED ORDER — POTASSIUM CHLORIDE CRYS ER 20 MEQ PO TBCR
40.0000 meq | EXTENDED_RELEASE_TABLET | Freq: Once | ORAL | Status: AC
  Administered 2018-11-18: 40 meq via ORAL
  Filled 2018-11-18: qty 2

## 2018-11-18 NOTE — Evaluation (Signed)
Physical Therapy Evaluation Patient Details Name: Todd Potter MRN: [id number] DOB: 10-17-1950 Today's Date: 11/18/2018   History of Present Illness  Todd Potter is a 66yoM who comes to [facility] after persistent Left UE/LE weakness, MRI shows acute ventral medulary infarct. PMH: DM, MI, HTN.  Clinical Impression  Pt admitted with above diagnosis. Pt currently with functional limitations due to the deficits listed below (see "PT Problem List"). Upon entry, pt in bed, awake and agreeable to participate. The pt is alert and oriented x4, pleasant, conversational, and generally a good historian. Examination reveals strength deficits in LLE and LUE, most pronounced at the ankle. No LOB or gait instability during session. Functional mobility assessment demonstrates increased effort/time requirements, good tolerance, but no need for physical assistance, whereas the patient performed these at a higher level of independence PTA. modI bed mobility and transfers, supervision AMB in hallway, and minGuard assist for stairs. Pt will benefit from skilled PT intervention to increase independence and safety with basic mobility in preparation for discharge to the venue listed below.       Follow Up Recommendations Outpatient PT;Other (comment)(Outpatient neuro at [facility] main)    Equipment Recommendations  None recommended by PT    Recommendations for Other Services OT consult     Precautions / Restrictions Precautions Precautions: None Restrictions Weight Bearing Restrictions: No      Mobility  Bed Mobility Overal bed mobility: Independent                Transfers Overall transfer level: Modified independent Equipment used: Straight cane             General transfer comment: RUE SPC use per baseline, no LOB, good confidence, has been getting up all night per patient  Ambulation/Gait Ambulation/Gait assistance: Supervision Gait Distance (Feet): 340 Feet Assistive device: Straight  cane Gait Pattern/deviations: Steppage     General Gait Details: LLE steppage with foot drop, not noted gait instability.  Stairs Stairs: Yes Stairs assistance: Min guard Stair Management: Alternating pattern;Step to pattern;No rails;With cane Number of Stairs: 6    Wheelchair Mobility    Modified Rankin (Stroke Patients Only)       Balance Overall balance assessment: Modified Independent;No apparent balance deficits (not formally assessed)                                           Pertinent Vitals/Pain Pain Assessment: No/denies pain    Home Living Family/patient expects to be discharged to:: Private residence Living Arrangements: Spouse/significant other Available Help at Discharge: Family Type of Home: House Home Access: Stairs to enter Entrance Stairs-Rails: Right Entrance Stairs-Number of Steps: 5 Home Layout: One level Home Equipment: Cane - single point      Prior Function Level of Independence: Independent with assistive device(s)         Comments: SPC use     Hand Dominance   Dominant Hand: Right    Extremity/Trunk Assessment   Upper Extremity Assessment Upper Extremity Assessment: Overall WFL for tasks assessed;LUE deficits/detail LUE Deficits / Details: Grip 50-75% weaker than Right; elbow grossly 4-/5.    Lower Extremity Assessment Lower Extremity Assessment: Overall WFL for tasks assessed;RLE deficits/detail RLE Deficits / Details: Calf 1/5, anterior compartment 0/5, Hamstrings 4-/5; Quads 5/5; hip flexion/extension 4/5. RLE Sensation: WNL       Communication   Communication: No difficulties  Cognition Arousal/Alertness: Awake/alert Behavior  During Therapy: WFL for tasks assessed/performed Overall Cognitive Status: Within Functional Limits for tasks assessed                                        General Comments      Exercises     Assessment/Plan    PT Assessment Patient needs continued  PT services  PT Problem List Decreased range of motion;Decreased strength;Decreased mobility;Decreased coordination;Decreased activity tolerance;Decreased knowledge of precautions       PT Treatment Interventions Stair training;Gait training;DME instruction;Balance training;Neuromuscular re-education;Functional mobility training;Therapeutic activities;Therapeutic exercise;Patient/family education;Manual techniques    PT Goals (Current goals can be found in the Care Plan section)  Acute Rehab PT Goals Patient Stated Goal: regain function as per baseline PT Goal Formulation: With patient Time For Goal Achievement: 12/02/18 Potential to Achieve Goals: Good    Frequency 7X/week   Barriers to discharge        Co-evaluation               AM-PAC PT "6 Clicks" Mobility  Outcome Measure Help needed turning from your back to your side while in a flat bed without using bedrails?: None Help needed moving from lying on your back to sitting on the side of a flat bed without using bedrails?: None Help needed moving to and from a bed to a chair (including a wheelchair)?: None Help needed standing up from a chair using your arms (e.g., wheelchair or bedside chair)?: None Help needed to walk in hospital room?: None Help needed climbing 3-5 steps with a railing? : A Little 6 Click Score: 23    End of Session   Activity Tolerance: Patient tolerated treatment well;No increased pain Patient left: in bed;with call bell/phone within reach Nurse Communication: Mobility status PT Visit Diagnosis: Difficulty in walking, not elsewhere classified (R26.2);Other abnormalities of gait and mobility (R26.89);Muscle weakness (generalized) (M62.81)    Time: 3154-0086 PT Time Calculation (min) (ACUTE ONLY): 15 min   Charges:   PT Evaluation $PT Eval Low Complexity: 1 Low PT Treatments $Therapeutic Exercise: 8-22 mins       10:25 AM, 11/18/18 Etta Grandchild, PT, DPT Physical Therapist - [facility]  [phone] ([place])    [place] 11/18/2018, 10:23 AM

## 2018-11-18 NOTE — Consult Note (Signed)
Subjective: still complaining of L sided weakness   Past Medical History:  Diagnosis Date  . Acanthosis nigricans   . CAD (coronary artery disease)   . Cardiomegaly   . Diabetes mellitus without complication ([place])   . History of adenomatous polyp of colon   . Hyperlipidemia   . Hypertension   . Knee pain   . Myocardial infarct [facility])     Past Surgical History:  Procedure Laterality Date  . CARDIAC CATHETERIZATION     Stent Placement x 5 to RCA   . COLONOSCOPY WITH PROPOFOL N/A 08/06/2016   Procedure: COLONOSCOPY WITH PROPOFOL;  Surgeon: Manya Silvas, MD;  Location: [facility] ENDOSCOPY;  Service: Endoscopy;  Laterality: N/A;  . CYSTECTOMY  1970&1990   x2 on tail bone    Family History  Problem Relation Age of Onset  . Diabetes Mother   . Diabetes Sister   . Anuerysm Brother   . Cancer Brother        brain  . Diabetes Brother     Social History:  reports that he quit smoking about 12 years ago. His smoking use included cigarettes. He has a 15.00 pack-year smoking history. He has never used smokeless tobacco. He reports current alcohol use. He reports that he does not use drugs.  No Known Allergies  Medications: I have reviewed the patient's current medications.  ROS: History obtained from the patient  General ROS: negative for - chills, fatigue, fever, night sweats, weight gain or weight loss Psychological ROS: negative for - behavioral disorder, hallucinations, memory difficulties, mood swings or suicidal ideation Ophthalmic ROS: negative for - blurry vision, double vision, eye pain or loss of vision ENT ROS: negative for - epistaxis, nasal discharge, oral lesions, sore throat, tinnitus or vertigo Allergy and Immunology ROS: negative for - hives or itchy/watery eyes Hematological and Lymphatic ROS: negative for - bleeding problems, bruising or swollen lymph nodes Endocrine ROS: negative for - galactorrhea, hair pattern changes, polydipsia/polyuria or temperature  intolerance Respiratory ROS: negative for - cough, hemoptysis, shortness of breath or wheezing Cardiovascular ROS: negative for - chest pain, dyspnea on exertion, edema or irregular heartbeat Gastrointestinal ROS: negative for - abdominal pain, diarrhea, hematemesis, nausea/vomiting or stool incontinence Genito-Urinary ROS: negative for - dysuria, hematuria, incontinence or urinary frequency/urgency Musculoskeletal ROS: negative for - joint swelling or muscular weakness Neurological ROS: as noted in HPI Dermatological ROS: negative for rash and skin lesion changes  Physical Examination: Blood pressure 121/73, pulse (!) 56, temperature 97.7 F (36.5 C), temperature source Oral, resp. rate 20, height 5' 6.5" (1.689 m), weight 106.1 kg, SpO2 97 %.   Neurological Examination   Mental Status: Alert, oriented, thought content appropriate.  Speech fluent without evidence of aphasia.  Able to follow 3 step commands without difficulty. Cranial Nerves: II: Discs flat bilaterally; Visual fields grossly normal, pupils equal, round, reactive to light and accommodation III,IV, VI: ptosis not present, extra-ocular motions intact bilaterally V,VII: smile symmetric, facial light touch sensation normal bilaterally VIII: hearing normal bilaterally IX,X: gag reflex present XI: bilateral shoulder shrug XII: midline tongue extension Motor: Right : Upper extremity   5/5    Left:     Upper extremity   4/5  Lower extremity   5/5     Lower extremity   4/5 Tone and bulk:normal tone throughout; no atrophy noted Sensory: Pinprick and light touch intact throughout, bilaterally Deep Tendon Reflexes: 1+ and symmetric throughout Plantars: Right: downgoing   Left: downgoing Cerebellar: normal finger to nose Gait:  not tested      Laboratory Studies:   Basic Metabolic Panel: Recent Labs  Lab 11/17/18 0904 11/17/18 1318 11/18/18 0618  NA 136  --  140  K 2.9*  --  3.3*  CL 102  --  104  CO2 24  --  27   GLUCOSE 238*  --  139*  BUN 15  --  11  CREATININE 1.25*  --  1.06  CALCIUM 9.0  --  9.2  MG  --  1.6* 1.8    Liver Function Tests: Recent Labs  Lab 11/17/18 0904  AST 18  ALT 17  ALKPHOS 89  BILITOT 0.7  PROT 7.9  ALBUMIN 3.8   No results for input(s): LIPASE, AMYLASE in the last 168 hours. No results for input(s): AMMONIA in the last 168 hours.  CBC: Recent Labs  Lab 11/17/18 0904  WBC 11.1*  NEUTROABS 7.0  HGB 15.2  HCT 45.2  MCV 86.1  PLT 170    Cardiac Enzymes: No results for input(s): CKTOTAL, CKMB, CKMBINDEX, TROPONINI in the last 168 hours.  BNP: Invalid input(s): POCBNP  CBG: Recent Labs  Lab 11/17/18 1829 11/17/18 2124  GLUCAP 123* 199*    Microbiology: Results for orders placed or performed during the hospital encounter of 11/17/18  SARS Coronavirus 2 [facility] order, Performed in [facility] hospital lab) Nasopharyngeal Nasopharyngeal Swab     Status: None   Collection Time: 11/17/18  1:18 PM   Specimen: Nasopharyngeal Swab  Result Value Ref Range Status   SARS Coronavirus 2 NEGATIVE NEGATIVE Final    Comment: (NOTE) If result is NEGATIVE SARS-CoV-2 target nucleic acids are NOT DETECTED. The SARS-CoV-2 RNA is generally detectable in upper and lower  respiratory specimens during the acute phase of infection. The lowest  concentration of SARS-CoV-2 viral copies this assay can detect is 250  copies / mL. A negative result does not preclude SARS-CoV-2 infection  and should not be used as the sole basis for treatment or other  patient management decisions.  A negative result may occur with  improper specimen collection / handling, submission of specimen other  than nasopharyngeal swab, presence of viral mutation(s) within the  areas targeted by this assay, and inadequate number of viral copies  (<250 copies / mL). A negative result must be combined with clinical  observations, patient history, and epidemiological information. If result is  POSITIVE SARS-CoV-2 target nucleic acids are DETECTED. The SARS-CoV-2 RNA is generally detectable in upper and lower  respiratory specimens dur ing the acute phase of infection.  Positive  results are indicative of active infection with SARS-CoV-2.  Clinical  correlation with patient history and other diagnostic information is  necessary to determine patient infection status.  Positive results do  not rule out bacterial infection or co-infection with other viruses. If result is PRESUMPTIVE POSTIVE SARS-CoV-2 nucleic acids MAY BE PRESENT.   A presumptive positive result was obtained on the submitted specimen  and confirmed on repeat testing.  While 2019 novel coronavirus  (SARS-CoV-2) nucleic acids may be present in the submitted sample  additional confirmatory testing may be necessary for epidemiological  and / or clinical management purposes  to differentiate between  SARS-CoV-2 and other Sarbecovirus currently known to infect humans.  If clinically indicated additional testing with an alternate test  methodology [phone]) is advised. The SARS-CoV-2 RNA is generally  detectable in upper and lower respiratory sp ecimens during the acute  phase of infection. The expected result is Negative. Fact Sheet  for Patients:  StrictlyIdeas.no Fact Sheet for Healthcare Providers: BankingDealers.co.za This test is not yet approved or cleared by the [place] FDA and has been authorized for detection and/or diagnosis of SARS-CoV-2 by FDA under an Emergency Use Authorization (EUA).  This EUA will remain in effect (meaning this test can be used) for the duration of the COVID-19 declaration under Section 564(b)(1) of the Act, 21 U.S.C. section 360bbb-3(b)(1), unless the authorization is terminated or revoked sooner. Performed at [facility], [address] [place], [place] [address]     Coagulation Studies: Recent Labs     11/17/18 0904  LABPROT 12.8  INR 1.0    Urinalysis: No results for input(s): COLORURINE, LABSPEC, PHURINE, GLUCOSEU, HGBUR, BILIRUBINUR, KETONESUR, PROTEINUR, UROBILINOGEN, NITRITE, LEUKOCYTESUR in the last 168 hours.  Invalid input(s): APPERANCEUR  Lipid Panel:     Component Value Date/Time   CHOL 120 11/18/2018 0618   CHOL 125 03/02/2018 0853   TRIG 91 11/18/2018 0618   HDL 31 (L) 11/18/2018 0618   HDL 40 03/02/2018 0853   CHOLHDL 3.9 11/18/2018 0618   VLDL 18 11/18/2018 0618   LDLCALC 71 11/18/2018 0618   LDLCALC 66 03/02/2018 0853    HgbA1C:  Lab Results  Component Value Date   HGBA1C 7.4 (A) 08/25/2018    Urine Drug Screen:  No results found for: LABOPIA, COCAINSCRNUR, LABBENZ, AMPHETMU, THCU, LABBARB  Alcohol Level: No results for input(s): ETH in the last 168 hours.  Other results: EKG: normal EKG, normal sinus rhythm, unchanged from previous tracings.  Imaging: Ct Head Wo Contrast  Result Date: 11/17/2018 CLINICAL DATA:  Stroke suspected, weakness in the left hand and left foot EXAM: CT HEAD WITHOUT CONTRAST TECHNIQUE: Contiguous axial images were obtained from the base of the skull through the vertex without intravenous contrast. COMPARISON:  None. FINDINGS: Brain: No evidence of acute infarction, hemorrhage, hydrocephalus, extra-axial collection or mass lesion/mass effect. Mild periventricular white matter hypodensity. Vascular: No hyperdense vessel or unexpected calcification. Skull: Normal. Negative for fracture or focal lesion. Sinuses/Orbits: No acute finding. Other: None. IMPRESSION: No acute intracranial pathology. No non-contrast CT evidence of acute stroke or hemorrhage. Mild small-vessel white matter disease. Consider MRI to further evaluate for acute diffusion restricting infarction if suspected. Electronically Signed   By: Eddie Candle M.D.   On: 11/17/2018 10:47   Mr Brain Wo Contrast  Result Date: 11/17/2018 CLINICAL DATA:  Focal neuro deficit,  greater than 6 hours, stroke suspected. Left upper and lower extremity weakness. EXAM: MRI HEAD WITHOUT CONTRAST TECHNIQUE: Multiplanar, multiecho pulse sequences of the brain and surrounding structures were obtained without intravenous contrast. COMPARISON:  CT head 11/17/2018. FINDINGS: Brain: The diffusion-weighted images demonstrate acute nonhemorrhagic infarct involving the ventral medial medulla on the right. This is consistent with the contralateral upper and lower extremity weakness. Neurologic exam do not report hemi sensory loss or ipsilateral hypoglossal palsy. No other acute or subacute infarction is present. No significant white matter disease is present. The ventricles are of normal size. No significant extraaxial fluid collection is present. The internal auditory canals are within normal limits. Focal T2 signal is present in the medial ventral medulla on the right. Brainstem and cerebellum are otherwise normal. Vascular: Normal flow voids. Skull and upper cervical spine: The craniocervical junction is normal. Upper cervical spine is within normal limits. Marrow signal is unremarkable. Sinuses/Orbits: Mucosal thickening is present the posterior left ethmoid air cells. There is opacification of posterior right ethmoid air cells. The sphenoid sinuses are clear. The  paranasal sinuses and mastoid air cells are otherwise clear. The globes and orbits are within normal limits. IMPRESSION: 1. Acute/subacute ventral medial medullary infarct. Question medial medullary syndrome, Dejerine syndrome. 2. No other significant ischemic or white matter disease. 3. Mild posterior ethmoid sinus disease bilaterally. Electronically Signed   By: San Morelle M.D.   On: 11/17/2018 16:57   US Carotid Bilateral (at [facility] Only)  Result Date: 11/17/2018 CLINICAL DATA:  Stroke symptoms, hypertension, hyperlipidemia, diabetes EXAM: BILATERAL CAROTID DUPLEX ULTRASOUND TECHNIQUE: Pearline Cables scale imaging, color Doppler  and duplex ultrasound were performed of bilateral carotid and vertebral arteries in the neck. COMPARISON:  None. FINDINGS: Criteria: Quantification of carotid stenosis is based on velocity parameters that correlate the residual internal carotid diameter with NASCET-based stenosis levels, using the diameter of the distal internal carotid lumen as the denominator for stenosis measurement. The following velocity measurements were obtained: RIGHT ICA: 62/24 cm/sec CCA: 16/10 cm/sec SYSTOLIC ICA/CCA RATIO:  0.8 ECA: 100 cm/sec LEFT ICA: 71 cm/sec CCA: 96/04 cm/sec SYSTOLIC ICA/CCA RATIO:  1.0 ECA: 88 cm/sec RIGHT CAROTID ARTERY: Mild scattered plaque formation. No hemodynamically significant right ICA stenosis, velocity elevation, or turbulent flow. Degree of narrowing less than 50%. RIGHT VERTEBRAL ARTERY:  Antegrade LEFT CAROTID ARTERY: Similar mild scattered plaque formation. No hemodynamically significant left ICA stenosis, velocity elevation, or turbulent flow. LEFT VERTEBRAL ARTERY:  Antegrade IMPRESSION: Mild carotid atherosclerosis. No hemodynamically significant ICA stenosis. Degree of narrowing less than 50% bilaterally by ultrasound criteria. Patent antegrade vertebral flow bilaterally Electronically Signed   By: Jerilynn Mages.  Shick M.D.   On: 11/17/2018 16:19     Assessment/Plan:  69 y.o. male  With hx of HTN, DM, MI in 2008 presents with left-sided weakness, acute onset 2 days ago and persistent since then.  The patient initially noticed decreased grip strength on the left hand and problems controlling his left arm, and subsequently noted that his left foot was dragging. No prior hx of similar symptoms in past.  On ASA 81 mg at home daily.    - Medullary stroke on MRI  - ASA 325 dailyStatin - stroke in setting of small vessel dz due to HTN, DM - Pt/ot - d/c planning  11/18/2018, 8:39 AM

## 2018-11-18 NOTE — Discharge Summary (Signed)
[facility] [place] NAME: Todd Potter    MR#:  [id number]  DATE OF BIRTH:  1950-11-30  DATE OF ADMISSION:  11/17/2018 ADMITTING PHYSICIAN: Henreitta Leber, MD  DATE OF DISCHARGE: 11/18/2018  3:49 PM  PRIMARY CARE PHYSICIAN: [name], MD    ADMISSION DIAGNOSIS:  CVA (cerebral vascular accident) [facility]) [I63.9] Cerebrovascular accident (CVA), unspecified mechanism ([place]) [I63.9]  DISCHARGE DIAGNOSIS:  Active Problems:   CVA (cerebrovascular accident) ([place])   SECONDARY DIAGNOSIS:   Past Medical History:  Diagnosis Date  . Acanthosis nigricans   . CAD (coronary artery disease)   . Cardiomegaly   . Diabetes mellitus without complication ([place])   . History of adenomatous polyp of colon   . Hyperlipidemia   . Hypertension   . Knee pain   . Myocardial infarct [facility])     HOSPITAL COURSE:   1.  Acute stroke with left-sided weakness and incoordination.  Patient had acute subacute ventral medial medullary infarct.  Seen by neurology and they recommended full aspirin.  Patient already on high-dose Lipitor.LDL 71. 2.  Essential hypertension couple blood pressure readings on the lower side.  Discontinue hydrochlorothiazide.  Continue losartan metoprolol and amlodipine. 3.  Hypokalemia.  Get rid of hydrochlorothiazide which is likely the culprit.  Give magnesium supplementation and oral potassium supplementation for a few days. 4.  Type 2 diabetes mellitus continue oral medications.  Hemoglobin A1c 7.1.  DISCHARGE CONDITIONS:   Satisfactory  CONSULTS OBTAINED:  Treatment Team:  Leotis Pain, MD  DRUG ALLERGIES:  No Known Allergies  DISCHARGE MEDICATIONS:   Allergies as of 11/18/2018   No Known Allergies     Medication List    STOP taking these medications   aspirin 81 MG tablet Replaced by: aspirin 325 MG EC tablet   hydrochlorothiazide 25 MG tablet Commonly known as: HYDRODIURIL     TAKE these medications    amLODipine 10 MG tablet Commonly known as: NORVASC Take 1 tablet (10 mg total) by mouth daily.   aspirin 325 MG EC tablet Take 1 tablet (325 mg total) by mouth daily. Start taking on: November 19, 2018 Replaces: aspirin 81 MG tablet   atorvastatin 80 MG tablet Commonly known as: LIPITOR TAKE 1 TABLET DAILY   empagliflozin 25 MG Tabs tablet Commonly known as: Jardiance TAKE 25 MG BY MOUTH DAILY. (NOT COVER- CONTACTING MD)   glipiZIDE 10 MG 24 hr tablet Commonly known as: GLUCOTROL XL TAKE 1 TABLET DAILY   losartan 100 MG tablet Commonly known as: COZAAR TAKE 1 TABLET BY MOUTH EVERY DAY   magnesium oxide 400 (241.3 Mg) MG tablet Commonly known as: MAG-OX Take 1 tablet (400 mg total) by mouth daily. Start taking on: November 19, 2018   metoprolol 200 MG 24 hr tablet Commonly known as: TOPROL-XL TAKE 1 TABLET BY MOUTH EVERY DAY   nitroGLYCERIN 0.4 MG SL tablet Commonly known as: NITROSTAT Place 1 tablet under the tongue. 3-5 minutes x3 as nedded   potassium chloride SA 20 MEQ tablet Commonly known as: K-DUR Take 1 tablet (20 mEq total) by mouth daily for 5 days.   sitaGLIPtin-metformin 50-1000 MG tablet Commonly known as: Janumet Take 1 tablet by mouth 2 (two) times daily.   tadalafil 5 MG tablet Commonly known as: CIALIS Take 1 tablet (5 mg total) by mouth every other day as needed for erectile dysfunction.        DISCHARGE INSTRUCTIONS:   Follow-up PMD 5 days  If  you experience worsening of your admission symptoms, develop shortness of breath, life threatening emergency, suicidal or homicidal thoughts you must seek medical attention immediately by calling 911 or calling your MD immediately  if symptoms less severe.  You Must read complete instructions/literature along with all the possible adverse reactions/side effects for all the Medicines you take and that have been prescribed to you. Take any new Medicines after you have completely understood and accept all  the possible adverse reactions/side effects.   Please note  You were cared for by a hospitalist during your hospital stay. If you have any questions about your discharge medications or the care you received while you were in the hospital after you are discharged, you can call the unit and asked to speak with the hospitalist on call if the hospitalist that took care of you is not available. Once you are discharged, your primary care physician will handle any further medical issues. Please note that NO REFILLS for any discharge medications will be authorized once you are discharged, as it is imperative that you return to your primary care physician (or establish a relationship with a primary care physician if you do not have one) for your aftercare needs so that they can reassess your need for medications and monitor your lab values.    Today   CHIEF COMPLAINT:   Chief Complaint  Patient presents with  . Weakness    HISTORY OF PRESENT ILLNESS:  Todd Potter  is a 68 y.o. male came in with left-sided weakness since Wednesday   VITAL SIGNS:  Blood pressure 131/78, pulse 63, temperature 97.7 F (36.5 C), temperature source Oral, resp. rate 20, height 5' 6.5" (1.689 m), weight 106.1 kg, SpO2 97 %.   PHYSICAL EXAMINATION:  GENERAL:  68 y.o.-year-old patient lying in the bed with no acute distress.  EYES: Pupils equal, round, reactive to light and accommodation. No scleral icterus. Extraocular muscles intact.  HEENT: Head atraumatic, normocephalic. Oropharynx and nasopharynx clear.  NECK:  Supple, no jugular venous distention. No thyroid enlargement, no tenderness.  LUNGS: Normal breath sounds bilaterally, no wheezing, rales,rhonchi or crepitation. No use of accessory muscles of respiration.  CARDIOVASCULAR: S1, S2 normal. No murmurs, rubs, or gallops.  ABDOMEN: Soft, non-tender, non-distended. Bowel sounds present. No organomegaly or mass.  EXTREMITIES: No pedal edema, cyanosis, or  clubbing.  NEUROLOGIC: Cranial nerves II through XII are intact. Muscle strength 4 out of 5 left side.  Incoordination left hand PSYCHIATRIC: The patient is alert and oriented x 3.  SKIN: No obvious rash, lesion, or ulcer.   DATA REVIEW:   CBC Recent Labs  Lab 11/17/18 0904  WBC 11.1*  HGB 15.2  HCT 45.2  PLT 170    Chemistries  Recent Labs  Lab 11/17/18 0904  11/18/18 0618  NA 136  --  140  K 2.9*  --  3.3*  CL 102  --  104  CO2 24  --  27  GLUCOSE 238*  --  139*  BUN 15  --  11  CREATININE 1.25*  --  1.06  CALCIUM 9.0  --  9.2  MG  --    < > 1.8  AST 18  --   --   ALT 17  --   --   ALKPHOS 89  --   --   BILITOT 0.7  --   --    < > = values in this interval not displayed.     Microbiology Results  Results for  orders placed or performed during the hospital encounter of 11/17/18  SARS Coronavirus 2 [facility] order, Performed in [facility] hospital lab) Nasopharyngeal Nasopharyngeal Swab     Status: None   Collection Time: 11/17/18  1:18 PM   Specimen: Nasopharyngeal Swab  Result Value Ref Range Status   SARS Coronavirus 2 NEGATIVE NEGATIVE Final    Comment: (NOTE) If result is NEGATIVE SARS-CoV-2 target nucleic acids are NOT DETECTED. The SARS-CoV-2 RNA is generally detectable in upper and lower  respiratory specimens during the acute phase of infection. The lowest  concentration of SARS-CoV-2 viral copies this assay can detect is 250  copies / mL. A negative result does not preclude SARS-CoV-2 infection  and should not be used as the sole basis for treatment or other  patient management decisions.  A negative result may occur with  improper specimen collection / handling, submission of specimen other  than nasopharyngeal swab, presence of viral mutation(s) within the  areas targeted by this assay, and inadequate number of viral copies  (<250 copies / mL). A negative result must be combined with clinical  observations, patient history, and epidemiological  information. If result is POSITIVE SARS-CoV-2 target nucleic acids are DETECTED. The SARS-CoV-2 RNA is generally detectable in upper and lower  respiratory specimens dur ing the acute phase of infection.  Positive  results are indicative of active infection with SARS-CoV-2.  Clinical  correlation with patient history and other diagnostic information is  necessary to determine patient infection status.  Positive results do  not rule out bacterial infection or co-infection with other viruses. If result is PRESUMPTIVE POSTIVE SARS-CoV-2 nucleic acids MAY BE PRESENT.   A presumptive positive result was obtained on the submitted specimen  and confirmed on repeat testing.  While 2019 novel coronavirus  (SARS-CoV-2) nucleic acids may be present in the submitted sample  additional confirmatory testing may be necessary for epidemiological  and / or clinical management purposes  to differentiate between  SARS-CoV-2 and other Sarbecovirus currently known to infect humans.  If clinically indicated additional testing with an alternate test  methodology [phone]) is advised. The SARS-CoV-2 RNA is generally  detectable in upper and lower respiratory sp ecimens during the acute  phase of infection. The expected result is Negative. Fact Sheet for Patients:  StrictlyIdeas.no Fact Sheet for Healthcare Providers: BankingDealers.co.za This test is not yet approved or cleared by the [place] FDA and has been authorized for detection and/or diagnosis of SARS-CoV-2 by FDA under an Emergency Use Authorization (EUA).  This EUA will remain in effect (meaning this test can be used) for the duration of the COVID-19 declaration under Section 564(b)(1) of the Act, 21 U.S.C. section 360bbb-3(b)(1), unless the authorization is terminated or revoked sooner. Performed at [facility], [address] [place], [place] [address]     RADIOLOGY:  Ct Head  Wo Contrast  Result Date: 11/17/2018 CLINICAL DATA:  Stroke suspected, weakness in the left hand and left foot EXAM: CT HEAD WITHOUT CONTRAST TECHNIQUE: Contiguous axial images were obtained from the base of the skull through the vertex without intravenous contrast. COMPARISON:  None. FINDINGS: Brain: No evidence of acute infarction, hemorrhage, hydrocephalus, extra-axial collection or mass lesion/mass effect. Mild periventricular white matter hypodensity. Vascular: No hyperdense vessel or unexpected calcification. Skull: Normal. Negative for fracture or focal lesion. Sinuses/Orbits: No acute finding. Other: None. IMPRESSION: No acute intracranial pathology. No non-contrast CT evidence of acute stroke or hemorrhage. Mild small-vessel white matter disease. Consider MRI to further evaluate  for acute diffusion restricting infarction if suspected. Electronically Signed   By: Eddie Candle M.D.   On: 11/17/2018 10:47   Mr Brain Wo Contrast  Result Date: 11/17/2018 CLINICAL DATA:  Focal neuro deficit, greater than 6 hours, stroke suspected. Left upper and lower extremity weakness. EXAM: MRI HEAD WITHOUT CONTRAST TECHNIQUE: Multiplanar, multiecho pulse sequences of the brain and surrounding structures were obtained without intravenous contrast. COMPARISON:  CT head 11/17/2018. FINDINGS: Brain: The diffusion-weighted images demonstrate acute nonhemorrhagic infarct involving the ventral medial medulla on the right. This is consistent with the contralateral upper and lower extremity weakness. Neurologic exam do not report hemi sensory loss or ipsilateral hypoglossal palsy. No other acute or subacute infarction is present. No significant white matter disease is present. The ventricles are of normal size. No significant extraaxial fluid collection is present. The internal auditory canals are within normal limits. Focal T2 signal is present in the medial ventral medulla on the right. Brainstem and cerebellum are otherwise  normal. Vascular: Normal flow voids. Skull and upper cervical spine: The craniocervical junction is normal. Upper cervical spine is within normal limits. Marrow signal is unremarkable. Sinuses/Orbits: Mucosal thickening is present the posterior left ethmoid air cells. There is opacification of posterior right ethmoid air cells. The sphenoid sinuses are clear. The paranasal sinuses and mastoid air cells are otherwise clear. The globes and orbits are within normal limits. IMPRESSION: 1. Acute/subacute ventral medial medullary infarct. Question medial medullary syndrome, Dejerine syndrome. 2. No other significant ischemic or white matter disease. 3. Mild posterior ethmoid sinus disease bilaterally. Electronically Signed   By: San Morelle M.D.   On: 11/17/2018 16:57   US Carotid Bilateral (at [facility])  Result Date: 11/17/2018 CLINICAL DATA:  Stroke symptoms, hypertension, hyperlipidemia, diabetes EXAM: BILATERAL CAROTID DUPLEX ULTRASOUND TECHNIQUE: Pearline Cables scale imaging, color Doppler and duplex ultrasound were performed of bilateral carotid and vertebral arteries in the neck. COMPARISON:  None. FINDINGS: Criteria: Quantification of carotid stenosis is based on velocity parameters that correlate the residual internal carotid diameter with NASCET-based stenosis levels, using the diameter of the distal internal carotid lumen as the denominator for stenosis measurement. The following velocity measurements were obtained: RIGHT ICA: 62/24 cm/sec CCA: 41/93 cm/sec SYSTOLIC ICA/CCA RATIO:  0.8 ECA: 100 cm/sec LEFT ICA: 71 cm/sec CCA: 79/02 cm/sec SYSTOLIC ICA/CCA RATIO:  1.0 ECA: 88 cm/sec RIGHT CAROTID ARTERY: Mild scattered plaque formation. No hemodynamically significant right ICA stenosis, velocity elevation, or turbulent flow. Degree of narrowing less than 50%. RIGHT VERTEBRAL ARTERY:  Antegrade LEFT CAROTID ARTERY: Similar mild scattered plaque formation. No hemodynamically significant left ICA  stenosis, velocity elevation, or turbulent flow. LEFT VERTEBRAL ARTERY:  Antegrade IMPRESSION: Mild carotid atherosclerosis. No hemodynamically significant ICA stenosis. Degree of narrowing less than 50% bilaterally by ultrasound criteria. Patent antegrade vertebral flow bilaterally Electronically Signed   By: Jerilynn Mages.  Shick M.D.   On: 11/17/2018 16:19    Management plans discussed with the patient, family and they are in agreement.  CODE STATUS:     Code Status Orders  (From admission, onward)         Start     Ordered   11/17/18 1519  Full code  Continuous     11/17/18 1518        Code Status History    This patient has a current code status but no historical code status.   Advance Care Planning Activity      TOTAL TIME TAKING CARE OF THIS PATIENT: 35 minutes.  Loletha Grayer M.D on 11/18/2018 at 4:25 PM  Between 7am to 6pm - Pager - [phone]  After 6pm go to [email or web address] - password [email or web address]  [employer] Office  [phone]  CC: Primary care physician; [name], MD

## 2018-11-18 NOTE — Plan of Care (Addendum)
Pt d/ced home with home PT after being stroke positive. Pt is A&O and is able to walk around with cane.  He does have some weakness in L arm and leg. No other deficits. IV removed.  Reviewed d/c instructions, new medications and f/u appts.  Pt's wife will pick him up.

## 2018-11-18 NOTE — Evaluation (Signed)
SLP Cancellation Note  Patient Details Name: Todd Potter MRN: [id number] DOB: [birth year]   Cancelled treatment:       Reason Eval/Treat Not Completed: SLP screened, no needs identified, will sign off  Pt reports no issues with speech or swallow. RN reports no issues. ST educated RN and pt to notify st if changes occur.    [place] Sauber 11/18/2018, 8:49 AM

## 2018-11-18 NOTE — Evaluation (Signed)
Occupational Therapy Evaluation Patient Details Name: Todd Potter MRN: [id number] DOB: 1950/08/29 Today's Date: 11/18/2018    History of Present Illness Todd Potter is a 68yoM who comes to [facility] after persistent Left UE/LE weakness, MRI shows acute ventral medulary infarct. PMH: DM, MI, HTN.   Clinical Impression   Pt is 68 year old male who presents with new onset of LUE and LE weakness and MRI indicated a medullary infarct(see above for presenting problems).  Pt is pleasant and eager to regain independence and use of LUE and LLE.  Pt had just finished working with PT about 30 minutes ago and was not fatigued. Pt is able to move L UE and hand but has decreased coordination, control and incomplete hand closure and difficulty extending fingers.  He has intact sensation and proprioception.  Pt requires min guard for LB dressing due to weakness and supervision only when ambulating to bathroom and independent in UB dressing and grooming and self feeding skills.  Rec continued OT while in hospital to continue to work on increasing safety with functional mobility training, neuromuscular re-ed and coordination exercises and family ed and training and Outpatient Neuro OT at [facility] after discharge.    Follow Up Recommendations  Outpatient OT    Equipment Recommendations       Recommendations for Other Services       Precautions / Restrictions Precautions Precautions: None Restrictions Weight Bearing Restrictions: No      Mobility Bed Mobility Overal bed mobility: Independent                Transfers Overall transfer level: Modified independent Equipment used: Straight cane             General transfer comment: RUE SPC use per baseline, no LOB, good confidence, has been getting up all night per patient    Balance Overall balance assessment: Modified Independent;No apparent balance deficits (not formally assessed)                                          ADL either performed or assessed with clinical judgement   ADL Overall ADL's : Needs assistance/impaired Eating/Feeding: Set up;Minimal assistance;Sitting   Grooming: Wash/dry hands;Wash/dry face;Oral care;Applying deodorant;Set up;Independent           Upper Body Dressing : Set up;Minimal assistance Upper Body Dressing Details (indicate cue type and reason): unable to complete buttons but able to complete zippers. Lower Body Dressing: Set up;Min guard;Sit to/from stand Lower Body Dressing Details (indicate cue type and reason): Pt aqble to lean forward to put pants over feet with extra time needed for L side with min guard. Toilet Transfer: Warehouse manager and Hygiene: Supervision/safety         General ADL Comments: Pt was using a SPC at home using his R hand to hold onto it and was dressing L side first due to some weakness in LLE.  Rec outpatient OT services.     Vision Baseline Vision/History: No visual deficits       Perception     Praxis      Pertinent Vitals/Pain Pain Assessment: No/denies pain     Hand Dominance Right   Extremity/Trunk Assessment Upper Extremity Assessment Upper Extremity Assessment: LUE deficits/detail LUE Deficits / Details: Grip 50-75% weaker than Right; elbow grossly 4-/5. Decrased control of LUE shoulder flexion and minimal opposition from thumb  to first 2 fingers only and unable to make full fist with difficulty extending fingers.  Sensation and proprioception intact. LUE Sensation: WNL LUE Coordination: decreased fine motor;decreased gross motor   Lower Extremity Assessment Lower Extremity Assessment: Defer to PT evaluation RLE Deficits / Details: Calf 1/5, anterior compartment 0/5, Hamstrings 4-/5; Quads 5/5; hip flexion/extension 4/5. RLE Sensation: WNL       Communication Communication Communication: No difficulties   Cognition Arousal/Alertness: Awake/alert Behavior During Therapy:  WFL for tasks assessed/performed Overall Cognitive Status: Within Functional Limits for tasks assessed                                     General Comments       Exercises     Shoulder Instructions      Home Living Family/patient expects to be discharged to:: Private residence Living Arrangements: Spouse/significant other Available Help at Discharge: Family Type of Home: House Home Access: Stairs to enter Technical brewer of Steps: 5 Entrance Stairs-Rails: Right Home Layout: One level     Bathroom Shower/Tub: Teacher, early years/pre: [place]: [employer] - single point          Prior Functioning/Environment Level of Independence: Independent with assistive device(s)        Comments: SPC use        OT Problem List: Decreased strength;Decreased range of motion;Decreased coordination      OT Treatment/Interventions: Self-care/ADL training;Neuromuscular education;Patient/family education    OT Goals(Current goals can be found in the care plan section) Acute Rehab OT Goals Patient Stated Goal: return to being independent again. OT Goal Formulation: With patient Time For Goal Achievement: 11/25/18 Potential to Achieve Goals: Good ADL Goals Pt/caregiver will Perform Home Exercise Program: Increased ROM;Increased strength;Left upper extremity;With theraputty;With written HEP provided;Independently  OT Frequency: Min 1X/week   Barriers to D/C:            Co-evaluation              AM-PAC OT "6 Clicks" Daily Activity     Outcome Measure Help from another person eating meals?: None Help from another person taking care of personal grooming?: None Help from another person toileting, which includes using toliet, bedpan, or urinal?: A Little Help from another person bathing (including washing, rinsing, drying)?: A Little Help from another person to put on and taking off regular upper body clothing?: None Help  from another person to put on and taking off regular lower body clothing?: A Little 6 Click Score: 21   End of Session Equipment Utilized During Treatment: Gait belt  Activity Tolerance: Patient tolerated treatment well Patient left: in bed;with call bell/phone within reach  OT Visit Diagnosis: Hemiplegia and hemiparesis;Muscle weakness (generalized) (M62.81) Hemiplegia - Right/Left: Left Hemiplegia - dominant/non-dominant: Non-Dominant Hemiplegia - caused by: Unspecified                Time: 1015-1050 OT Time Calculation (min): 35 min Charges:  OT General Charges $OT Visit: 1 Visit OT Evaluation $OT Eval Low Complexity: 1 Low OT Treatments $Neuromuscular Re-education: 8-22 mins  Chrys Racer, OTR/L, [place] ascom [phone] 11/18/18, 11:03 AM

## 2018-11-20 ENCOUNTER — Telehealth: Payer: Self-pay | Admitting: Family Medicine

## 2018-11-20 DIAGNOSIS — Z8673 Personal history of transient ischemic attack (TIA), and cerebral infarction without residual deficits: Secondary | ICD-10-CM

## 2018-11-20 LAB — HIV ANTIBODY (ROUTINE TESTING W REFLEX): HIV Screen 4th Generation wRfx: NONREACTIVE

## 2018-11-20 NOTE — Telephone Encounter (Signed)
Patient reports he had a stroke and is needing a referral for physical therapy.

## 2018-11-20 NOTE — Care Management (Signed)
Post discharge note entry 11/20/18: RNCM received call from PT stating that [facility] reached out to him about patient calling them for outpatient services. Discharging provider updated of this need.

## 2018-11-20 NOTE — Telephone Encounter (Signed)
Needing a call back to discuss where to send that he goes to physical therapy. Pt is not clear where this needs to be sent.  Please call pt back at [phone].  Thanks, [employer]

## 2018-11-21 NOTE — Telephone Encounter (Signed)
Pt advised that the referral was placed.   Thanks,   -Mickel Baas

## 2018-11-24 ENCOUNTER — Telehealth: Payer: Self-pay | Admitting: Family Medicine

## 2018-11-24 NOTE — Telephone Encounter (Signed)
Pt needing a call back regarding physical therapy he is supposed to be starting.  He hasn't heard from anyone to schedule the PT.  Thanks, [employer]

## 2018-11-27 NOTE — Telephone Encounter (Signed)
Patient advised that [facility] rehab is suppose to call him. I did give him the phone number to rehab if he wants to call. [phone]

## 2018-11-29 ENCOUNTER — Ambulatory Visit: Payer: Medicare Other | Attending: Family Medicine

## 2018-11-29 ENCOUNTER — Other Ambulatory Visit: Payer: Self-pay

## 2018-11-29 VITALS — BP 119/73 | HR 69

## 2018-11-29 DIAGNOSIS — R531 Weakness: Secondary | ICD-10-CM

## 2018-11-29 DIAGNOSIS — R278 Other lack of coordination: Secondary | ICD-10-CM | POA: Insufficient documentation

## 2018-11-29 DIAGNOSIS — R2681 Unsteadiness on feet: Secondary | ICD-10-CM

## 2018-11-29 DIAGNOSIS — M6281 Muscle weakness (generalized): Secondary | ICD-10-CM | POA: Diagnosis not present

## 2018-11-29 DIAGNOSIS — R269 Unspecified abnormalities of gait and mobility: Secondary | ICD-10-CM

## 2018-11-29 NOTE — Therapy (Signed)
[place] MAIN [facility] SERVICES [address] [place], [place], [address] Phone: [phone]   Fax:  [phone]  Physical Therapy Evaluation  Patient Details  Name: Todd Potter MRN: [id number] Date of Birth: Sep 07, 1950 Referring Provider (PT): [name] MD   Encounter Date: 11/29/2018  PT End of Session - 11/29/18 1055    Visit Number  1    Number of Visits  25    Date for PT Re-Evaluation  02/21/19    Authorization Type  initial eval: 11/29/18    PT Start Time  1100    PT Stop Time  1200    PT Time Calculation (min)  60 min    Equipment Utilized During Treatment  Gait belt    Activity Tolerance  Patient tolerated treatment well    Behavior During Therapy  [facility] for tasks assessed/performed       Past Medical History:  Diagnosis Date  . Acanthosis nigricans   . CAD (coronary artery disease)   . Cardiomegaly   . Diabetes mellitus without complication ([place])   . History of adenomatous polyp of colon   . Hyperlipidemia   . Hypertension   . Knee pain   . Myocardial infarct [facility])     Past Surgical History:  Procedure Laterality Date  . CARDIAC CATHETERIZATION     Stent Placement x 5 to RCA   . COLONOSCOPY WITH PROPOFOL N/A 08/06/2016   Procedure: COLONOSCOPY WITH PROPOFOL;  Surgeon: Manya Silvas, MD;  Location: [facility] ENDOSCOPY;  Service: Endoscopy;  Laterality: N/A;  . CYSTECTOMY  1970&1990   x2 on tail bone    Vitals:   11/29/18 1108  BP: 119/73  Pulse: 69  SpO2: 96%     Subjective Assessment - 11/29/18 1248    Subjective  CVA    Patient is accompained by:  Family member    Pertinent History  Pt reports getting up in the morning on 11/16/18 and upon waking noticed not having any strength in his left hand and his wife encouraged him to go to the hospital. He did not experience and numbness or tingling just weakness in LUE. He did not notice any changes in strength or sensation LLE. The following day 11/17/18 he began to  notice some toe drag of LLE and weakness. He called his doctor who encouraged him to go to the hospital in which he did. 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    Limitations  Walking    Patient Stated Goals  riding his motorcycle, fishing without difficulty, walking without difficulty    Currently in Pain?  No/denies        SUBJECTIVE Chief complaint: Pt reports getting up in the morning on 11/16/18 and upon waking noticed not having any strength in his left hand and his wife encouraged him to go to the hospital. He did not experience and numbness or tingling just weakness in LUE. He did not notice any changes in strength or sensation LLE. The following day 11/17/18 he began to notice some toe drag of LLE and weakness. He called his doctor who encouraged him to go to the hospital in which he did. 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  Onset: 11/17/18 Imaging: MRI showing ventral medial medullary infarct  Recent changes in overall health/medication: No Directional pattern for falls: None Prior history of physical therapy for balance: None Follow-up appointment with MD: None scheduled Red flags (bowel/bladder changes, saddle paresthesia,  personal history of cancer, chills/fever, night sweats, unrelenting pain) Negative    OBJECTIVE  MUSCULOSKELETAL: Tremor: Absent Bulk: Normal Tone: Normal, no clonus  Posture No gross abnormalities noted in standing or seated posture; mild forward head posture   Gait Independent ambulation with SPC. Pt walked toe out LLE and demonstrates dec stance time and lateral trunk leaning; noted LLE knee buckling d/t weakness. L foot drop and slap noted;   Strength  R/L 5/4 Hip flexion  5/4 Hip abduction 5/4 Hip adduction 5/4+ Knee extension 5/4- Knee flexion 5/3 Ankle Dorsiflexion Decreased L grip strength  5/3+ Pronation; noted L shoulder abd/flexion/IR compensation  5/3+ Supination    Grip  strength  R= 96, 87 (avg: 91.5#) L = 26, 28, 31 (avg: 28.3#)  Pincher grasp R = 16#, L =1#  9-hole peg test  R = 27.12s L =1 min 18.62s with no success with any pegs so test terminted. Pt with considerable weakness with finger to thumb opposition and thumb/1st digit pinger grip. He attempts to compensate by trying to pick up peg with full grip.   NEUROLOGICAL:  Mental Status Patient is oriented to person, place and time.  Recent memory is intact.  Remote memory is intact.  Attention span and concentration are intact.  Expressive speech is intact.  Patient's fund of knowledge is within normal limits for educational level.  Sensation Grossly intact to light touch bilateral UEs/LEs as determined by testing dermatomes C2-T2/L2-S2 respectively Proprioception and hot/cold testing deferred on this date   Coordination/Cerebellar Finger Opposition: Slow and weak on left     FUNCTIONAL OUTCOME MEASURES   Results Comments  BERG 50/56 Fall risk, in need of intervention  Greatest difficulty in SLS, tandem, and alt toe taps   TUG 14.96 seconds Fall risk, use of SPC   5TSTS 13.66 seconds Below normative value  10 Meter Gait Speed Self-selected: 16.5s =  0.61 m/s; Fastest: 11.4s = 0.88 m/s Below normative values for full community ambulation       TREATMENT  Exercises added to HEP   Therapeutic Exercise  Seated Toe Raise 3s hold x10 bilateral  Mini Squat in // bars with CGA 2x10; verbal cues for foot positioning and form  Standing Romberg to 1/2 Tandem Stance 30sx3 with RLE lead LE          Objective measurements completed on examination: See above findings.          PT Education - 11/29/18 1055    Education Details  POC    Person(s) Educated  Patient    Methods  Explanation    Comprehension  Verbalized understanding       PT Short Term Goals - 11/29/18 1241      PT SHORT TERM GOAL #1   Title  Pt will be independent with HEP in order to improve strength  and balance in order to decrease fall risk and improve function at home and work.    Time  6    Period  Weeks    Status  New    Target Date  01/10/19        PT Long Term Goals - 11/29/18 1241      PT LONG TERM GOAL #1   Title  Pt will improve BERG by at least 3 points in order to demonstrate clinically significant improvement in balance.    Baseline  11/29/18: 50/56    Time  12    Period  Weeks    Status  New  Target Date  02/21/19      PT LONG TERM GOAL #2   Title  Pt will decrease 5TSTS by at least 3 seconds in order to demonstrate clinically significant improvement in LE strength.    Baseline  11/29/18: 13.66s    Time  12    Period  Weeks    Status  New    Target Date  02/21/19      PT LONG TERM GOAL #3   Title  Pt will decrease TUG to below 14 seconds/decrease in order to demonstrate decreased fall risk.    Baseline  11/29/18: 14.96s    Time  12    Period  Weeks    Status  New    Target Date  02/21/19      PT LONG TERM GOAL #4   Title  Patient will improve walking speed to >1.0 m/s in order to increase safety for community ambulation.    Baseline  11/29/18: Self-selected: 16.5s =  0.61 m/s; Fastest: 11.4s = 0.88 m/s    Time  12    Period  Weeks    Status  New             Plan - 11/29/18 1150    Clinical Impression Statement  Pt is a pleasant 68 year-old male referred for difficulty with balance and left-sided weakness resulting from ventral medial medullary infarct on 11/17/18. Pt presents with deficits in strength, gait and balance. PT examination reveals independent ambulation with SPC. He walks toe out LLE and demonstrates dec stance time and lateral trunk leaning; noted LLE knee buckling d/t weakness as well as L foot drop/slap. Global left LE strength = 4/5; L ankle DF 3/5. His strength deficits are more significant in the LUE demonstrating deficits with grip strength and pincer grasp 50-75% weaker on the left. Pt had significant difficulty with 9-hole peg  test on the left unable to successfully pick up 1 peg and place in hole. Recommend OT evaluation and intervention. BERG = 50/56 revealing deficits in single leg stance, tandem stance, and alt toe taps which requires sustained and controlled single leg stance. TUG = 14.96s with SPC indicating fall risk. 5 times sit to stand = 13.66s below normative value. Pt gait speed is below normative values for full community ambulation with self-selected: 16.5s =  0.61 m/s; Fastest: 11.4s = 0.88 m/s. Pt will benefit from skilled PT services to address deficits in balance and decrease risk for future falls.    Personal Factors and Comorbidities  Comorbidity 3+;Age    Comorbidities  CAD, HTN, DM, hyperlipidemia    Examination-Activity Limitations  Squat;Locomotion Level;Stand    Examination-Participation Restrictions  Driving;Yard Work;Other   Leisure activities/fishing   Stability/Clinical Decision Making  Evolving/Moderate complexity    Clinical Decision Making  Moderate    Rehab Potential  Good    PT Frequency  2x / week    PT Duration  12 weeks    PT Treatment/Interventions  ADLs/Self Care Home Management;Aquatic Therapy;Biofeedback;Canalith Repostioning;Cryotherapy;Electrical Stimulation;Ultrasound;Moist Heat;Iontophoresis 4mg /ml Dexamethasone;Gait training;Stair training;Functional mobility training;Therapeutic activities;Therapeutic exercise;Balance training;Patient/family education;Neuromuscular re-education;Manual techniques;Joint Manipulations;Spinal Manipulations;Vestibular;Traction;DME Instruction;Cognitive remediation;Orthotic Fit/Training;Passive range of motion;Dry needling;Splinting    PT Next Visit Plan  begin strengthening and balance exercises    PT Home Exercise Plan  Access Code: [email or web address]    Consulted and Agree with Plan of Care  Patient;Family member/caregiver    Family Member Consulted  Spouse       Patient will benefit from skilled therapeutic intervention in order to  improve the  following deficits and impairments:  Abnormal gait, Decreased coordination, Difficulty walking, Impaired UE functional use, Decreased balance, Decreased mobility, Decreased strength  Visit Diagnosis: 1. Acute left-sided weakness   2. Abnormal gait   3. Unsteadiness on feet        Problem List Patient Active Problem List   Diagnosis Date Noted  . CVA (cerebrovascular accident) ([place]) 11/17/2018  . Morbid obesity ([place]) 02/07/2015  . Knee pain 02/06/2015  . History of adenomatous polyp of colon 02/06/2015  . Acanthosis nigricans 02/06/2015  . Hyperlipidemia, mixed 01/12/2011  . 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 Pure hypercholesterolemia 03/13/2007  . Essential hypertension 03/13/2007  . Personal history of tobacco use 02/08/2007  . Cardiac enlargement 10/18/2006  . Myocardial infarct, old 09/18/2006  . 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 CAD (coronary artery disease) 09/18/2006   This entire session was performed under direct supervision and direction of a licensed therapist/therapist assistant . I have personally read, edited and approve of the note as written.   Elmyra Ricks Goble Fudala SPT Phillips Grout PT, DPT, GCS  Huprich,Jason 11/30/2018, 11:24 AM  [place] MAIN [facility] SERVICES [address] [place], [place], [address] Phone: [phone]   Fax:  [phone]  Name: KAILAND SEDA MRN: [id number] Date of Birth: May 04, 1950

## 2018-11-29 NOTE — Patient Instructions (Signed)
Access Code: [email or web address]  URL: [email or web address]  Date: 11/29/2018  Prepared by: Roxana Hires   Exercises Seated Toe Raise - 10 reps - 2 sets - 3s hold - 2x daily - 7x weekly Mini Squat with Counter Support - 10 reps - 2 sets - 2x daily - 7x weekly Standing Romberg to 1/2 Tandem Stance - 3 reps - 30s hold - 2x daily - 7x weekly

## 2018-12-01 ENCOUNTER — Telehealth: Payer: Self-pay | Admitting: Family Medicine

## 2018-12-01 DIAGNOSIS — Z8673 Personal history of transient ischemic attack (TIA), and cerebral infarction without residual deficits: Secondary | ICD-10-CM

## 2018-12-01 NOTE — Telephone Encounter (Signed)
OK for OT as requested below.

## 2018-12-01 NOTE — Telephone Encounter (Signed)
Order placed.  Left message advising [name].   Thanks,   -Mickel Baas

## 2018-12-01 NOTE — Telephone Encounter (Signed)
-----   Message from Consuela Mimes sent at 11/29/2018 11:23 AM EDT ----- Contact: [phone] Dr. Caryn Section, Mr. Fullen is being Evaluated by PT today and the therapist is recommending that he also have Occupational therapy. If you concur, please send order for OT.  Thank you, Hoover Brunette

## 2018-12-04 ENCOUNTER — Ambulatory Visit: Payer: Medicare Other | Admitting: Physical Therapy

## 2018-12-04 ENCOUNTER — Encounter: Payer: Self-pay | Admitting: Physical Therapy

## 2018-12-04 ENCOUNTER — Other Ambulatory Visit: Payer: Self-pay

## 2018-12-04 DIAGNOSIS — R269 Unspecified abnormalities of gait and mobility: Secondary | ICD-10-CM | POA: Diagnosis not present

## 2018-12-04 DIAGNOSIS — R278 Other lack of coordination: Secondary | ICD-10-CM | POA: Diagnosis not present

## 2018-12-04 DIAGNOSIS — R2681 Unsteadiness on feet: Secondary | ICD-10-CM

## 2018-12-04 DIAGNOSIS — R531 Weakness: Secondary | ICD-10-CM | POA: Diagnosis not present

## 2018-12-04 DIAGNOSIS — M6281 Muscle weakness (generalized): Secondary | ICD-10-CM | POA: Diagnosis not present

## 2018-12-04 NOTE — Therapy (Signed)
[place] MAIN [place] [facility] SERVICES [address] [place], [place], [address] Phone: [phone]   Fax:  [phone]  Physical Therapy Treatment  Patient Details  Name: Todd Potter MRN: [id number] Date of Birth: 04/16/1951 Referring Provider (PT): [name] MD   Encounter Date: 12/04/2018  PT End of Session - 12/04/18 1311    Visit Number  2    Number of Visits  25    Date for PT Re-Evaluation  02/21/19    Authorization Type  initial eval: 11/29/18    PT Start Time  1302    PT Stop Time  1345    PT Time Calculation (min)  43 min    Equipment Utilized During Treatment  Gait belt    Activity Tolerance  Patient tolerated treatment well    Behavior During Therapy  [facility] for tasks assessed/performed       Past Medical History:  Diagnosis Date  . Acanthosis nigricans   . CAD (coronary artery disease)   . Cardiomegaly   . Diabetes mellitus without complication ([place])   . History of adenomatous polyp of colon   . Hyperlipidemia   . Hypertension   . Knee pain   . Myocardial infarct [facility])     Past Surgical History:  Procedure Laterality Date  . CARDIAC CATHETERIZATION     Stent Placement x 5 to RCA   . COLONOSCOPY WITH PROPOFOL N/A 08/06/2016   Procedure: COLONOSCOPY WITH PROPOFOL;  Surgeon: Manya Silvas, MD;  Location: [facility] ENDOSCOPY;  Service: Endoscopy;  Laterality: N/A;  . CYSTECTOMY  1970&1990   x2 on tail bone    There were no vitals filed for this visit.  Subjective Assessment - 12/04/18 1309    Subjective  Patient reports doing well; Denies any pain or soreness; Reports adherence with HEP;    Patient is accompained by:  Family member    Pertinent History  Pt reports getting up in the morning on 11/16/18 and upon waking noticed not having any strength in his left hand and his wife encouraged him to go to the hospital. He did not experience and numbness or tingling just weakness in LUE. He did not notice any changes in strength  or sensation LLE. The following day 11/17/18 he began to notice some toe drag of LLE and weakness. He called his doctor who encouraged him to go to the hospital in which he did. 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    Limitations  Walking    Patient Stated Goals  riding his motorcycle, fishing without difficulty, walking without difficulty    Currently in Pain?  No/denies    Multiple Pain Sites  No        TREATMENT: Leg press: BLE 125# x15 with both legs with min VCS for proper positioning for optimal motor control; Leg press: LLE only 75# 2x12 with min VCs to slow down eccentric control and to avoid genu recurvatum for better motor control;   Standing beside steps with green tband around BLE: -hip abduction x10 bilaterally; -hip extension x10 bilaterally; Patient required min-moderate verbal/tactile cues for correct exercise technique including to avoid trunk lean for better hip strengthening and control;   Standing with green tband behind LLE: terminal knee extension x15 reps with min VCs for proper technique for optimal muscle control; Patient had difficulty isolating LLE knee movement often trying to shift weight between legs;    Patient instructed in advanced balance exercise  Standing in  front of steps:  Standing on airex foam: -alternate toe taps to 4 inch step with 2-1 rail assist x15 reps bilaterally; -Standing one foot on airex, one foot on 4 inch step, head turns side/side, up/down x5 reps each foot on step -Heel/toe raises x10 reps with rail assist for balance -mini squat unsupported x15 reps with cues for reaching forward for better stance control; Required min VCs to improve weight shift to LLE for better neutral positioning and to increase LLE muscle activation;  -modified tandem stance: ball pass side/side x5 reps each foot in front; -modified tandem stance BUE arm raises x5 reps each foot in front; -feet together, eyes open/closed 10 sec hold  x3 sets with cues for weight shift for better stance control;  Patient required min VCs for balance stability, including to increase trunk control for less loss of balance with smaller base of support  Response to treatment:  Patient tolerated well; he does exhibit decreased motor control in LLE requiring cues for improved knee stabilization with increased weight acceptance;                         PT Education - 12/04/18 1310    Education Details  LE strengthening, balance, gait safety;    Person(s) Educated  Patient    Methods  Explanation;Verbal cues    Comprehension  Verbalized understanding;Returned demonstration;Verbal cues required;Need further instruction       PT Short Term Goals - 11/29/18 1241      PT SHORT TERM GOAL #1   Title  Pt will be independent with HEP in order to improve strength and balance in order to decrease fall risk and improve function at home and work.    Time  6    Period  Weeks    Status  New    Target Date  01/10/19        PT Long Term Goals - 11/29/18 1241      PT LONG TERM GOAL #1   Title  Pt will improve BERG by at least 3 points in order to demonstrate clinically significant improvement in balance.    Baseline  11/29/18: 50/56    Time  12    Period  Weeks    Status  New    Target Date  02/21/19      PT LONG TERM GOAL #2   Title  Pt will decrease 5TSTS by at least 3 seconds in order to demonstrate clinically significant improvement in LE strength.    Baseline  11/29/18: 13.66s    Time  12    Period  Weeks    Status  New    Target Date  02/21/19      PT LONG TERM GOAL #3   Title  Pt will decrease TUG to below 14 seconds/decrease in order to demonstrate decreased fall risk.    Baseline  11/29/18: 14.96s    Time  12    Period  Weeks    Status  New    Target Date  02/21/19      PT LONG TERM GOAL #4   Title  Patient will improve walking speed to >1.0 m/s in order to increase safety for community ambulation.     Baseline  11/29/18: Self-selected: 16.5s =  0.61 m/s; Fastest: 11.4s = 0.88 m/s    Time  12    Period  Weeks    Status  New  Plan - 12/04/18 1437    Clinical Impression Statement  Patient motivated and participated well within session. He does exhibit decreased LLE knee control especially with increased weight acceptance activities such as toe taps/hip exercise. Patient required increased cues to improve knee control with stance tasks with slowing down weight acceptance and increasing quad activation. He had increased difficulty with advanced balance exercise especially with compliant surfaces with narrow base of support. He would benefit from additional skilled PT Intervention to improve strength, balance and mobility;    Personal Factors and Comorbidities  Comorbidity 3+;Age    Comorbidities  CAD, HTN, DM, hyperlipidemia    Examination-Activity Limitations  Squat;Locomotion Level;Stand    Examination-Participation Restrictions  Driving;Yard Work;Other   Leisure activities/fishing   Stability/Clinical Decision Making  Evolving/Moderate complexity    Rehab Potential  Good    PT Frequency  2x / week    PT Duration  12 weeks    PT Treatment/Interventions  ADLs/Self Care Home Management;Aquatic Therapy;Biofeedback;Canalith Repostioning;Cryotherapy;Electrical Stimulation;Ultrasound;Moist Heat;Iontophoresis 4mg /ml Dexamethasone;Gait training;Stair training;Functional mobility training;Therapeutic activities;Therapeutic exercise;Balance training;Patient/family education;Neuromuscular re-education;Manual techniques;Joint Manipulations;Spinal Manipulations;Vestibular;Traction;DME Instruction;Cognitive remediation;Orthotic Fit/Training;Passive range of motion;Dry needling;Splinting    PT Next Visit Plan  begin strengthening and balance exercises    PT Home Exercise Plan  Access Code: [email or web address]    Consulted and Agree with Plan of Care  Patient;Family member/caregiver    Family Member  Consulted  Spouse       Patient will benefit from skilled therapeutic intervention in order to improve the following deficits and impairments:  Abnormal gait, Decreased coordination, Difficulty walking, Impaired UE functional use, Decreased balance, Decreased mobility, Decreased strength  Visit Diagnosis: 1. Acute left-sided weakness   2. Abnormal gait   3. Unsteadiness on feet        Problem List Patient Active Problem List   Diagnosis Date Noted  . CVA (cerebrovascular accident) ([place]) 11/17/2018  . Morbid obesity ([place]) 02/07/2015  . Knee pain 02/06/2015  . History of adenomatous polyp of colon 02/06/2015  . Acanthosis nigricans 02/06/2015  . Hyperlipidemia, mixed 01/12/2011  . 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 Pure hypercholesterolemia 03/13/2007  . Essential hypertension 03/13/2007  . Personal history of tobacco use 02/08/2007  . Cardiac enlargement 10/18/2006  . Myocardial infarct, old 09/18/2006  . Controlled diabetes mellitus with renal manifestations ([place]) 09/18/2006  . CAD (coronary artery disease) 09/18/2006    Rayven Hendrickson PT, DPT 12/04/2018, 2:39 PM  [place] MAIN [facility] SERVICES [address] [place], [place], [address] Phone: [phone]   Fax:  [phone]  Name: DENNISON MCDAID MRN: [id number] Date of Birth: 1950-10-16

## 2018-12-06 ENCOUNTER — Other Ambulatory Visit: Payer: Self-pay

## 2018-12-06 ENCOUNTER — Ambulatory Visit: Payer: Medicare Other | Admitting: Occupational Therapy

## 2018-12-06 ENCOUNTER — Encounter: Payer: Self-pay | Admitting: Occupational Therapy

## 2018-12-06 ENCOUNTER — Ambulatory Visit: Payer: Medicare Other

## 2018-12-06 VITALS — BP 141/69 | HR 69

## 2018-12-06 DIAGNOSIS — R269 Unspecified abnormalities of gait and mobility: Secondary | ICD-10-CM

## 2018-12-06 DIAGNOSIS — R531 Weakness: Secondary | ICD-10-CM

## 2018-12-06 DIAGNOSIS — R2681 Unsteadiness on feet: Secondary | ICD-10-CM | POA: Diagnosis not present

## 2018-12-06 DIAGNOSIS — R278 Other lack of coordination: Secondary | ICD-10-CM | POA: Diagnosis not present

## 2018-12-06 DIAGNOSIS — M6281 Muscle weakness (generalized): Secondary | ICD-10-CM | POA: Diagnosis not present

## 2018-12-06 NOTE — Therapy (Signed)
[place] MAIN [facility] SERVICES [address] [place], [place], [address] Phone: [phone]   Fax:  [phone]  Physical Therapy Treatment  Patient Details  Name: Todd Potter MRN: [id number] Date of Birth: July 13, 1950 Referring Provider (PT): [name] MD   Encounter Date: 12/06/2018  PT End of Session - 12/06/18 1007    Visit Number  3    Number of Visits  25    Date for PT Re-Evaluation  02/21/19    Authorization Type  initial eval: 11/29/18    PT Start Time  1015    PT Stop Time  1100    PT Time Calculation (min)  45 min    Equipment Utilized During Treatment  Gait belt    Activity Tolerance  Patient tolerated treatment well    Behavior During Therapy  [facility] for tasks assessed/performed       Past Medical History:  Diagnosis Date  . Acanthosis nigricans   . CAD (coronary artery disease)   . Cardiomegaly   . Diabetes mellitus without complication ([place])   . History of adenomatous polyp of colon   . Hyperlipidemia   . Hypertension   . Knee pain   . Myocardial infarct [facility])     Past Surgical History:  Procedure Laterality Date  . CARDIAC CATHETERIZATION     Stent Placement x 5 to RCA   . COLONOSCOPY WITH PROPOFOL N/A 08/06/2016   Procedure: COLONOSCOPY WITH PROPOFOL;  Surgeon: Manya Silvas, MD;  Location: [facility] ENDOSCOPY;  Service: Endoscopy;  Laterality: N/A;  . CYSTECTOMY  1970&1990   x2 on tail bone    Vitals:   12/06/18 1022  BP: (!) 141/69  Pulse: 69  SpO2: 99%    Subjective Assessment - 12/06/18 1006    Subjective  Pt reports doing well today and feeling good after last session. He has his initial evaluation with OT today. No reports of pain or soreness. No questions or concerns at this time.    Patient is accompained by:  Family member    Pertinent History  Pt reports getting up in the morning on 11/16/18 and upon waking noticed not having any strength in his left hand and his wife encouraged him to go to the  hospital. He did not experience and numbness or tingling just weakness in LUE. He did not notice any changes in strength or sensation LLE. The following day 11/17/18 he began to notice some toe drag of LLE and weakness. He called his doctor who encouraged him to go to the hospital in which he did. 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    Limitations  Walking    Patient Stated Goals  riding his motorcycle, fishing without difficulty, walking without difficulty    Currently in Pain?  No/denies          TREATMENT  Therapeutic Exercise  NuStep L3 x3 minutes warmup during history  Leg press: BLE 125# x15 with both legs with min VCS for proper positioning for optimal motor control, pt demonstrates dec motor control of LLE going into genu recurvatum  Leg press: LLE only 75# x15 with min VCs to slow down eccentric control and to avoid genu recurvatum for better motor control Standing hip abduction with green tband x15 bilaterally  Standing hip extension with green tband x15 bilaterally; 4" step ups forward and laterally x10 each; pt voluntarily stop lateral step up leading with RLE after 3 reps due to pain  Neuromuscular Re-education  Done in // bars  Airex alt toe taps to 4" step with 2 -1 rail assist x15 bilaterally  Standing one foot on airex and one foot on 4" step standing balance x30s alt LE  Standing one foot on airex and one foot on 4" step standing balance with horizontal and vertical head turns x30s alt LE    foam heel toe raises x15 each; rail assist for balance  Airex slow marches x30s Airex modified tandem stance balance x30s alt LE  Mini squat on airex x15 reps with cues for reaching forward for better stance control; requires min verbal cueing to improve weight shift to LLE for better neutral positioning and to increase LLE muscle activation Step over & back with orange hurdle x10 each LE; flat single hand support, attempted side stepping over 2 hurdles  with dec LLE control to negotiate hurdle   Tandem gait in // bars forward/retro x3 lengths each       Pt educated throughout session about proper posture and technique with exercises. Improved exercise technique, movement at target joints, use of target muscles after min to mod verbal, visual, tactile cues.    Pt demonstrates good motivation during physical therapy session today. Pt requires min-mod verbal cueing for correct exercise technique to avoid excessive forward trunk leaning during standing hip extension for better muscle activation. He requires min verbal cueing for balance stability, including to increase trunk control for less loss of balance with modified tandem and balance with one foot on airex and one foot on 4" step. Pt requires CGA and intermittent unilateral UE support on right during tandem gait due to unsteadiness. Pt demonstrates dec motor control in LLE requiring cues for improved knee stabilization with increased weight acceptance. He often demonstrates genu recurvatum with increased weight acceptance. Pt will benefit from PT services to address deficits in strength, balance, and mobility in order to return to full function at home.         PT Education - 12/06/18 1006    Education Details  exercise technique    Person(s) Educated  Patient    Methods  Explanation;Demonstration;Tactile cues;Verbal cues    Comprehension  Verbal cues required;Verbalized understanding;Returned demonstration;Tactile cues required       PT Short Term Goals - 11/29/18 1241      PT SHORT TERM GOAL #1   Title  Pt will be independent with HEP in order to improve strength and balance in order to decrease fall risk and improve function at home and work.    Time  6    Period  Weeks    Status  New    Target Date  01/10/19        PT Long Term Goals - 11/29/18 1241      PT LONG TERM GOAL #1   Title  Pt will improve BERG by at least 3 points in order to demonstrate clinically significant  improvement in balance.    Baseline  11/29/18: 50/56    Time  12    Period  Weeks    Status  New    Target Date  02/21/19      PT LONG TERM GOAL #2   Title  Pt will decrease 5TSTS by at least 3 seconds in order to demonstrate clinically significant improvement in LE strength.    Baseline  11/29/18: 13.66s    Time  12    Period  Weeks    Status  New    Target  Date  02/21/19      PT LONG TERM GOAL #3   Title  Pt will decrease TUG to below 14 seconds/decrease in order to demonstrate decreased fall risk.    Baseline  11/29/18: 14.96s    Time  12    Period  Weeks    Status  New    Target Date  02/21/19      PT LONG TERM GOAL #4   Title  Patient will improve walking speed to >1.0 m/s in order to increase safety for community ambulation.    Baseline  11/29/18: Self-selected: 16.5s =  0.61 m/s; Fastest: 11.4s = 0.88 m/s    Time  12    Period  Weeks    Status  New            Plan - 12/06/18 1007    Clinical Impression Statement  Pt demonstrates good motivation during physical therapy session today. Pt requires min-mod verbal cueing for correct exercise technique to avoid excessive forward trunk leaning during standing hip extension for better muscle activation. He requires min verbal cueing for balance stability, including to increase trunk control for less loss of balance with modified tandem and balance with one foot on airex and one foot on 4" step. Pt requires CGA and intermittent unilateral UE support on right during tandem gait due to unsteadiness. Pt demonstrates dec motor control in LLE requiring cues for improved knee stabilization with increased weight acceptance. He often demonstrates genu recurvatum with increased weight acceptance. Pt will benefit from PT services to address deficits in strength, balance, and mobility in order to return to full function at home.    Personal Factors and Comorbidities  Comorbidity 3+;Age    Comorbidities  CAD, HTN, DM, hyperlipidemia     Examination-Activity Limitations  Squat;Locomotion Level;Stand    Examination-Participation Restrictions  Driving;Yard Work;Other   Leisure activities/fishing   Stability/Clinical Decision Making  Evolving/Moderate complexity    Rehab Potential  Good    PT Frequency  2x / week    PT Duration  12 weeks    PT Treatment/Interventions  ADLs/Self Care Home Management;Aquatic Therapy;Biofeedback;Canalith Repostioning;Cryotherapy;Electrical Stimulation;Ultrasound;Moist Heat;Iontophoresis 4mg /ml Dexamethasone;Gait training;Stair training;Functional mobility training;Therapeutic activities;Therapeutic exercise;Balance training;Patient/family education;Neuromuscular re-education;Manual techniques;Joint Manipulations;Spinal Manipulations;Vestibular;Traction;DME Instruction;Cognitive remediation;Orthotic Fit/Training;Passive range of motion;Dry needling;Splinting    PT Next Visit Plan  begin strengthening and balance exercises    PT Home Exercise Plan  Access Code: [email or web address]    Consulted and Agree with Plan of Care  Patient;Family member/caregiver    Family Member Consulted  Spouse       Patient will benefit from skilled therapeutic intervention in order to improve the following deficits and impairments:  Abnormal gait, Decreased coordination, Difficulty walking, Impaired UE functional use, Decreased balance, Decreased mobility, Decreased strength  Visit Diagnosis: 1. Acute left-sided weakness   2. Abnormal gait   3. Unsteadiness on feet        Problem List Patient Active Problem List   Diagnosis Date Noted  . CVA (cerebrovascular accident) ([place]) 11/17/2018  . Morbid obesity ([place]) 02/07/2015  . Knee pain 02/06/2015  . History of adenomatous polyp of colon 02/06/2015  . Acanthosis nigricans 02/06/2015  . Hyperlipidemia, mixed 01/12/2011  . 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 Pure hypercholesterolemia 03/13/2007  . Essential hypertension 03/13/2007  . Personal history of tobacco use  02/08/2007  . Cardiac enlargement 10/18/2006  . Myocardial infarct, old 09/18/2006  . 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 CAD (coronary artery disease) 09/18/2006  This entire session was performed under direct supervision and direction of a licensed therapist/therapist assistant . I have personally read, edited and approve of the note as written.   Elmyra Ricks Layth Cerezo SPT Phillips Grout PT, DPT, GCS  Huprich,Jason 12/06/2018, 3:12 PM  [place] MAIN [facility] SERVICES [address] [place], [place], [address] Phone: [phone]   Fax:  [phone]  Name: HUTTON PELLICANE MRN: [id number] Date of Birth: Mar 25, 1951

## 2018-12-06 NOTE — Therapy (Signed)
[place] MAIN [facility] SERVICES [address] [place], [place], [address] Phone: [phone]   Fax:  [phone]  Occupational Therapy Evaluation  Patient Details  Name: Todd Potter MRN: [id number] Date of Birth: Sep 17, 1950 Referring Provider (OT): Dr. Caryn Section   Encounter Date: 12/06/2018  OT End of Session - 12/06/18 1740    Visit Number  1    Number of Visits  24    Date for OT Re-Evaluation  03/07/19    OT Start Time  1100    OT Stop Time  1200    OT Time Calculation (min)  60 min    Activity Tolerance  Patient tolerated treatment well    Behavior During Therapy  [facility] for tasks assessed/performed       Past Medical History:  Diagnosis Date  . Acanthosis nigricans   . CAD (coronary artery disease)   . Cardiomegaly   . Diabetes mellitus without complication ([place])   . History of adenomatous polyp of colon   . Hyperlipidemia   . Hypertension   . Knee pain   . Myocardial infarct [facility])     Past Surgical History:  Procedure Laterality Date  . CARDIAC CATHETERIZATION     Stent Placement x 5 to RCA   . COLONOSCOPY WITH PROPOFOL N/A 08/06/2016   Procedure: COLONOSCOPY WITH PROPOFOL;  Surgeon: Manya Silvas, MD;  Location: [facility] ENDOSCOPY;  Service: Endoscopy;  Laterality: N/A;  . CYSTECTOMY  1970&1990   x2 on tail bone    There were no vitals filed for this visit.  Subjective Assessment - 12/06/18 1732    Subjective   Pt. was present to the therapy session with his wife.    Patient is accompanied by:  Family member    Pertinent History  Pt. is a 68 y.o. male who was diagnosed with a CVA with Left sided weakness. Pt. resides with his wife, and was previously independent with ADLs, and IADLs prior to onset of the CVA. 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 Pt. enjoying riding motorcycles with his wife.    Limitations  LUE weakness, decreased motor control, and [facility] skills.    Patient Stated Goals  To regain use of  his LUE    Currently in Pain?  No/denies        [facility] OT Assessment - 12/06/18 1109      Assessment   Medical Diagnosis  CVA    Referring Provider (OT)  Dr. Caryn Section    Onset Date/Surgical Date  11/17/18    Hand Dominance  Right    Next MD Visit  None     Prior Therapy  None      Precautions   Precautions  None      Restrictions   Weight Bearing Restrictions  No      Balance Screen   Has the patient fallen in the past 6 months  No      Home  Environment   Family/patient expects to be discharged to:  Private residence    Living Arrangements  Spouse/significant other    Available Help at Discharge  Family    Type of [place] Access  Level entry    [place]  One level    Bathroom Shower/Tub  Tub/Shower unit    [place] - single point    Lives With  Spouse  Prior Function   Level of Independence  Independent    Vocation  Part time employment    Automotive engineer for [employer]      ADL   Eating/Feeding  Independent   Assist cutting meat   Grooming  Independent    Upper Body Bathing  Minimal assistance    Lower Body Bathing  Moderate assistance    Upper Body Dressing  Independent   asssit with [place] [employer]  Independent    Restaurant manager, fast food  Supervision/safety    ADL comments  Pt. hads difficulty enging LUE during ADL tasks      IADL   Prior Level of Function Shopping  Independnet   Independent   Shopping  Takes care of all shopping needs independently    Prior Level of Function Light Housekeeping  Independent    Light Housekeeping  Performs light daily tasks such as dishwashing, bed making    Prior Level of Function Meal Prep  Independent    Meal Prep  Able to complete simple warm meal prep    Prior Level of Function Sales executive   Relies on family or friends for transportation    Prior Level of Function Medication Managment  Independent    Medication Management  Is responsible for taking medication in correct dosages at correct time    Prior Level of Function Herbalist financial matters independently (budgets, writes checks, pays rent, bills goes to bank), collects and keeps track of income      Written Expression   Dominant Hand  Right    Handwriting  100% legible      Vision - History   Baseline Vision  Wears glasses only for reading      Activity Tolerance   Activity Tolerance  Tolerates 30 min activity with multiple rests      Cognition   Overall Cognitive Status  Within Functional Limits for tasks assessed      Sensation   Light Touch  Appears Intact    Proprioception  Appears Intact      Coordination   Right 9 Hole Peg Test  25    Left 9 Hole Peg Test  2 min. & 54 sec.      Strength   Overall Strength Comments  RUE: 5/5, LUE: shoulder flexion 4/4, abduction 4-/5, elbow  & wrist flexion, and extension 5/5      Hand Function   Right Hand Grip (lbs)  95    Right Hand Lateral Pinch  16 lbs    Right Hand 3 Point Pinch  14 lbs    Left Hand Grip (lbs)  40    Left Hand Lateral Pinch  8 lbs    Left 3 point pinch  7 lbs      OT TREATMENT    Therapeutic Exercise:   Pt. Worked on green thearputty ex. For hand strengthening. Exercises included: gross gripping, gross digit extension, thumb abduction, lateral, and 3pt. Pinch strengthening, digit abduction, and thumb opposition. Pt. was provided with a HEP through [employer]. Pt. was provided with a personalized access code to obtain the exercises online, or vis the App.  OT Long Term Goals - 12/06/18 1748      OT LONG TERM GOAL #1   Title  Pt. will improve grip strength by 10# to be able to sustain a firm hold on a motorcycle clutch.     Baseline  Eval: Pt. is unable    Time  12    Period  Weeks    Status  New    Target Date  03/07/19      OT LONG TERM GOAL #2   Title  Pt. will improve lateral pinch strength by 3# to be able to hold a plate steady.    Baseline  Eval: Pt. has difficulty hold a plate steady, and carrying it.    Time  12    Period  Weeks    Status  New    Target Date  03/07/19      OT LONG TERM GOAL #3   Title  Pt. will independently hike the left side of his pants.    Baseline  Eval: Pt. is unable    Time  12    Period  Weeks    Status  New    Target Date  03/07/19      OT LONG TERM GOAL #4   Title  Pt. will independently wash his RUE using his LUE.    Baseline  Eval: Pt. has difficulty    Time  12    Period  Weeks    Status  On-going    Target Date  03/07/19      OT LONG TERM GOAL #5   Title  Pt. will improvie left hand [facility] skills  to be able to tie his shoes independently    Baseline  Eval: Pt. is unable    Time  12    Period  Weeks    Status  New    Target Date  03/07/19            Plan - 12/06/18 1740    Clinical Impression Statement  Pt. is a 68 y.o. male who was diagnosed with a CVA. Pt. presents with left sided weakness, decreased grip strength, decreased pinch strength, and impaired [facility] skills  which limit his ability to perform ADL, and IADL tasks. Pt. will benefit from OT services to improve left UE strength, hand function, and coordination skills in order to promote independence with ADLs, IADLs, and to be able to hold a plate steady, maintain a prolonged hold on his motorcycle clutch, hike his clothing, washing, tyng shoes.    OT Occupational Profile and History  Detailed Assessment- Review of Records and additional review of physical, cognitive, psychosocial history related to current functional performance    Occupational performance deficits (Please refer to evaluation for details):  ADL's;IADL's    Body Structure / Function / Physical Skills  ADL;IADL;FMC;ROM;UE  functional use;Strength    Clinical Decision Making  Several treatment options, min-mod task modification necessary    Comorbidities Affecting Occupational Performance:  May have comorbidities impacting occupational performance    Modification or Assistance to Complete Evaluation   Min-Moderate modification of tasks or assist with assess necessary to complete eval    OT Frequency  2x / week    OT Duration  12 weeks    OT Treatment/Interventions  Self-care/ADL training;Therapeutic activities;Therapeutic exercise;Patient/family education;DME and/or AE instruction;Neuromuscular education    OT Home Exercise Plan  green theraputty    Consulted and Agree with Plan of Care  Patient;Family member/caregiver    Family Member  Consulted  Wife       Patient will benefit from skilled therapeutic intervention in order to improve the following deficits and impairments:   Body Structure / Function / Physical Skills: ADL, IADL, FMC, ROM, UE functional use, Strength       Visit Diagnosis: 1. Muscle weakness (generalized)   2. Other lack of coordination       Problem List Patient Active Problem List   Diagnosis Date Noted  . CVA (cerebrovascular accident) ([place]) 11/17/2018  . Morbid obesity ([place]) 02/07/2015  . Knee pain 02/06/2015  . History of adenomatous polyp of colon 02/06/2015  . Acanthosis nigricans 02/06/2015  . Hyperlipidemia, mixed 01/12/2011  . 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 Pure hypercholesterolemia 03/13/2007  . Essential hypertension 03/13/2007  . Personal history of tobacco use 02/08/2007  . Cardiac enlargement 10/18/2006  . Myocardial infarct, old 09/18/2006  . Controlled diabetes mellitus with renal manifestations ([place]) 09/18/2006  . CAD (coronary artery disease) 09/18/2006    Todd Carina, MS, OTR/L 12/06/2018, 5:57 PM  [place] MAIN [facility] SERVICES [address] [place], [place], [address] Phone: [phone]   Fax:   [phone]  Name: Todd Potter MRN: [id number] Date of Birth: 1950-07-14

## 2018-12-11 ENCOUNTER — Telehealth: Payer: Self-pay | Admitting: Family Medicine

## 2018-12-11 ENCOUNTER — Ambulatory Visit: Payer: Medicare Other | Admitting: Occupational Therapy

## 2018-12-11 ENCOUNTER — Other Ambulatory Visit: Payer: Self-pay

## 2018-12-11 ENCOUNTER — Encounter: Payer: Self-pay | Admitting: Occupational Therapy

## 2018-12-11 DIAGNOSIS — R278 Other lack of coordination: Secondary | ICD-10-CM

## 2018-12-11 DIAGNOSIS — M6281 Muscle weakness (generalized): Secondary | ICD-10-CM | POA: Diagnosis not present

## 2018-12-11 DIAGNOSIS — R531 Weakness: Secondary | ICD-10-CM | POA: Diagnosis not present

## 2018-12-11 DIAGNOSIS — R2681 Unsteadiness on feet: Secondary | ICD-10-CM | POA: Diagnosis not present

## 2018-12-11 DIAGNOSIS — R269 Unspecified abnormalities of gait and mobility: Secondary | ICD-10-CM | POA: Diagnosis not present

## 2018-12-11 NOTE — Telephone Encounter (Signed)
Pt called for samples of   Jardiance 25 mg  Janument 50-1000  CB#  [phone]  Thanks Con Memos

## 2018-12-11 NOTE — Therapy (Signed)
[place] MAIN [facility] SERVICES [address] [place], [place], [address] Phone: [phone]   Fax:  [phone]  Occupational Therapy Treatment  Patient Details  Name: Todd Potter MRN: [id number] Date of Birth: Sep 09, 1950 Referring Provider (OT): Dr. Caryn Section   Encounter Date: 12/11/2018  OT End of Session - 12/11/18 1221    Visit Number  2    Number of Visits  24    Date for OT Re-Evaluation  03/07/19    OT Start Time  1104    OT Stop Time  1145    OT Time Calculation (min)  41 min    Activity Tolerance  Patient tolerated treatment well    Behavior During Therapy  [facility] for tasks assessed/performed       Past Medical History:  Diagnosis Date  . Acanthosis nigricans   . CAD (coronary artery disease)   . Cardiomegaly   . Diabetes mellitus without complication ([place])   . History of adenomatous polyp of colon   . Hyperlipidemia   . Hypertension   . Knee pain   . Myocardial infarct [facility])     Past Surgical History:  Procedure Laterality Date  . CARDIAC CATHETERIZATION     Stent Placement x 5 to RCA   . COLONOSCOPY WITH PROPOFOL N/A 08/06/2016   Procedure: COLONOSCOPY WITH PROPOFOL;  Surgeon: Manya Silvas, MD;  Location: [facility] ENDOSCOPY;  Service: Endoscopy;  Laterality: N/A;  . CYSTECTOMY  1970&1990   x2 on tail bone    There were no vitals filed for this visit.  Subjective Assessment - 12/11/18 1221    Subjective   Pt. reports having had a good weekend    Patient is accompanied by:  Family member    Pertinent History  Pt. is a 67 y.o. male who was diagnosed with a CVA with Left sided weakness. Pt. resides with his wife, and was previously independent with ADLs, and IADLs prior to onset of the CVA. 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 Pt. enjoying riding motorcycles with his wife.    Limitations  LUE weakness, decreased motor control, and [facility] skills.    Patient Stated Goals  To regain use of his LUE    Currently in Pain?  No/denies      OT TREATMENT    Neuro muscular re-education:  Pt. worked on grasping 1" resistive cubes alternating thumb opposition to the tip of the 2nd through 5th digits while the board is placed at a vertical angle. Pt. worked on pressing the cubes back into place while alternating isolated 2nd through 5th digit extension. Pt. performed [facility] tasks using the Grooved pegboard. Pt. worked on grasping the grooved pegs from an elevated horizontal position , and twisting them in a vertical position in the hand to prepare for placing them in the grooved slot. Pt. Presented with increased compensation proximally.   Therapeutic Exercise:  Pt. performed gross gripping with grip strengthener. Pt. worked on sustaining grip while grasping pegs and reaching at various heights. The Gripper was placed in the 3rd resistive slot with the white resistive spring. Pt. Worked on pinch strengthening in the left hand for lateral, and 3pt. pinch using yellow, red, green, and blue resistive clips. Pt. worked on placing the clips at various vertical and horizontal angles. Tactile and verbal cues were required for eliciting the desired movement. Pt. Presented with increased compensation proximally hiking his shoulder, and leaning to the right with his trunk during the  tasks.                               OT Long Term Goals - 12/06/18 1748      OT LONG TERM GOAL #1   Title  Pt. will improve grip strength by 10# to be able to sustain a firm hold on a motorcycle clutch.    Baseline  Eval: Pt. is unable    Time  12    Period  Weeks    Status  New    Target Date  03/07/19      OT LONG TERM GOAL #2   Title  Pt. will improve lateral pinch strength by 3# to be able to hold a plate steady.    Baseline  Eval: Pt. has difficulty hold a plate steady, and carrying it.    Time  12    Period  Weeks    Status  New    Target Date  03/07/19      OT LONG TERM GOAL #3   Title  Pt.  will independently hike the left side of his pants.    Baseline  Eval: Pt. is unable    Time  12    Period  Weeks    Status  New    Target Date  03/07/19      OT LONG TERM GOAL #4   Title  Pt. will independently wash his RUE using his LUE.    Baseline  Eval: Pt. has difficulty    Time  12    Period  Weeks    Status  On-going    Target Date  03/07/19      OT LONG TERM GOAL #5   Title  Pt. will improvie left hand [facility] skills  to be able to tie his shoes independently    Baseline  Eval: Pt. is unable    Time  12    Period  Weeks    Status  New    Target Date  03/07/19            Plan - 12/11/18 1222    Clinical Impression Statement  Pt. continues to present with limited LUE strength, impaired motor control, and [facility] skills. Pt. is trying to engage his left UE during tasks  more at home, and is doing the HEP with the theraputty consistently. Pt. presents with increased compensation proximally, and through the trunk when attempting FMCs, and reaching with the LUE. Pt. continues to work on these skills to improve islotaed LUE functioning during ADLs, and IADL tasks.    OT Occupational Profile and History  Detailed Assessment- Review of Records and additional review of physical, cognitive, psychosocial history related to current functional performance    Occupational performance deficits (Please refer to evaluation for details):  ADL's;IADL's    Body Structure / Function / Physical Skills  ADL;IADL;FMC;ROM;UE functional use;Strength    Clinical Decision Making  Several treatment options, min-mod task modification necessary    Comorbidities Affecting Occupational Performance:  May have comorbidities impacting occupational performance    Modification or Assistance to Complete Evaluation   Min-Moderate modification of tasks or assist with assess necessary to complete eval    OT Frequency  2x / week    OT Duration  12 weeks    OT Treatment/Interventions  Self-care/ADL training;Therapeutic  activities;Therapeutic exercise;Patient/family education;DME and/or AE instruction;Neuromuscular education    Consulted and Agree with Plan of Care  Patient;Family member/caregiver    Family Member Consulted  Wife       Patient will benefit from skilled therapeutic intervention in order to improve the following deficits and impairments:   Body Structure / Function / Physical Skills: ADL, IADL, FMC, ROM, UE functional use, Strength       Visit Diagnosis: Muscle weakness (generalized)  Other lack of coordination    Problem List Patient Active Problem List   Diagnosis Date Noted  . CVA (cerebrovascular accident) ([place]) 11/17/2018  . Morbid obesity ([place]) 02/07/2015  . Knee pain 02/06/2015  . History of adenomatous polyp of colon 02/06/2015  . Acanthosis nigricans 02/06/2015  . Hyperlipidemia, mixed 01/12/2011  . 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 Pure hypercholesterolemia 03/13/2007  . Essential hypertension 03/13/2007  . Personal history of tobacco use 02/08/2007  . Cardiac enlargement 10/18/2006  . Myocardial infarct, old 09/18/2006  . Controlled diabetes mellitus with renal manifestations ([place]) 09/18/2006  . CAD (coronary artery disease) 09/18/2006    Harrel Carina 12/11/2018, 12:28 PM  [place] MAIN [facility] SERVICES [address] [place], [place], [address] Phone: [phone]   Fax:  [phone]  Name: Todd Potter MRN: [id number] Date of Birth: 08/24/1950

## 2018-12-11 NOTE — Telephone Encounter (Signed)
He can have a months worth of samples.

## 2018-12-12 NOTE — Telephone Encounter (Signed)
Samples at the front desk.  Pt advised.   Thanks,   -Laura  

## 2018-12-14 ENCOUNTER — Encounter: Payer: Self-pay | Admitting: Occupational Therapy

## 2018-12-14 ENCOUNTER — Other Ambulatory Visit: Payer: Self-pay

## 2018-12-14 ENCOUNTER — Ambulatory Visit: Payer: Medicare Other

## 2018-12-14 ENCOUNTER — Ambulatory Visit: Payer: Medicare Other | Admitting: Occupational Therapy

## 2018-12-14 DIAGNOSIS — R531 Weakness: Secondary | ICD-10-CM | POA: Diagnosis not present

## 2018-12-14 DIAGNOSIS — M6281 Muscle weakness (generalized): Secondary | ICD-10-CM | POA: Diagnosis not present

## 2018-12-14 DIAGNOSIS — R2681 Unsteadiness on feet: Secondary | ICD-10-CM | POA: Diagnosis not present

## 2018-12-14 DIAGNOSIS — R269 Unspecified abnormalities of gait and mobility: Secondary | ICD-10-CM | POA: Diagnosis not present

## 2018-12-14 DIAGNOSIS — R278 Other lack of coordination: Secondary | ICD-10-CM

## 2018-12-14 NOTE — Patient Instructions (Signed)
Access Code: [email or web address]  URL: [email or web address]  Date: 12/14/2018  Prepared by: Roxana Hires   Exercises Seated Toe Raise - 10 reps - 2 sets - 3s hold - 2x daily - 7x weekly Mini Squat with Counter Support - 10 reps - 2 sets - 2x daily - 7x weekly Standing Romberg to 1/2 Tandem Stance - 3 reps - 30s hold - 2x daily - 7x weekly Standing Hip Flexion with Resistance Loop - 10 reps - 3 sets - 1x daily - 7x weekly Hip Extension with Resistance Loop - 10 reps - 3 sets - 1x daily - 7x weekly Hip Abduction with Resistance Loop - 10 reps - 3 sets - 1x daily - 7x weekly

## 2018-12-14 NOTE — Therapy (Signed)
[place] MAIN [facility] SERVICES [address] [place], [place], [address] Phone: [phone]   Fax:  [phone]  Occupational Therapy Treatment  Patient Details  Name: Todd Potter MRN: [id number] Date of Birth: [birth year] Referring Provider (OT): Dr. Caryn Section   Encounter Date: 12/14/2018  OT End of Session - 12/14/18 1217    Visit Number  3    Number of Visits  24    Date for OT Re-Evaluation  03/07/19    OT Start Time  1105    OT Stop Time  1145    OT Time Calculation (min)  40 min    Activity Tolerance  Patient tolerated treatment well    Behavior During Therapy  [facility] for tasks assessed/performed       Past Medical History:  Diagnosis Date  . Acanthosis nigricans   . CAD (coronary artery disease)   . Cardiomegaly   . Diabetes mellitus without complication ([place])   . History of adenomatous polyp of colon   . Hyperlipidemia   . Hypertension   . Knee pain   . Myocardial infarct [facility])     Past Surgical History:  Procedure Laterality Date  . CARDIAC CATHETERIZATION     Stent Placement x 5 to RCA   . COLONOSCOPY WITH PROPOFOL N/A 08/06/2016   Procedure: COLONOSCOPY WITH PROPOFOL;  Surgeon: Manya Silvas, MD;  Location: [facility] ENDOSCOPY;  Service: Endoscopy;  Laterality: N/A;  . CYSTECTOMY  1970&1990   x2 on tail bone    There were no vitals filed for this visit.  Subjective Assessment - 12/14/18 1107    Subjective   Pt. reports doing well today    Patient is accompanied by:  Family member    Pertinent History  Pt. is a 68 y.o. male who was diagnosed with a CVA with Left sided weakness. Pt. resides with his wife, and was previously independent with ADLs, and IADLs prior to onset of the CVA. 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 Pt. enjoying riding motorcycles with his wife.    Patient Stated Goals  To regain use of his LUE    Currently in Pain?  No/denies      OT TREATMENT    Neuro muscular  re-education:  Therapeutic Exercise:  Pt. performed gross gripping with grip strengthener. Pt. worked on sustaining grip while grasping pegs and reaching at various heights. The gripper was set at 11.2# of force. Pt. with less hiking in the shoulder, and leaning to the right during this task. Pt. worked on pinch strengthening in the left hand for lateral, and 3pt. pinch using yellow, red, green, and blue resistive clips. Pt. worked on placing the clips at various vertical and horizontal angles. Emphasis was placed on grasping, and turning the direction of the clip in his left hand without stabilizing each on the tabletop surface. Increased compensation proximally noted with leaning to the right, and hiking of the shoulder. Tactile and verbal cues were required for eliciting the desired movement.   Neuromuscular re-ed:  Pt. worked on [facility] skills grasping 1" sticks, and 1/4" collars. Pt. worked on storing the 1" sticks in the palm, and translatory skills moving the items from the palm of the hand to the tip of the 2nd digit, and thumb. Pt. Dropped multiple sticks attempting translatory movements. Pt. presented with increased compensation proximally requiring increased cues from midline sitting, and to avoid hiking his shoulder during the task. Pt. Worked on translatory movements  moving 1" circular object proximal to distal through his hand.                             OT Long Term Goals - 12/06/18 1748      OT LONG TERM GOAL #1   Title  Pt. will improve grip strength by 10# to be able to sustain a firm hold on a motorcycle clutch.    Baseline  Eval: Pt. is unable    Time  12    Period  Weeks    Status  New    Target Date  03/07/19      OT LONG TERM GOAL #2   Title  Pt. will improve lateral pinch strength by 3# to be able to hold a plate steady.    Baseline  Eval: Pt. has difficulty hold a plate steady, and carrying it.    Time  12    Period  Weeks    Status  New     Target Date  03/07/19      OT LONG TERM GOAL #3   Title  Pt. will independently hike the left side of his pants.    Baseline  Eval: Pt. is unable    Time  12    Period  Weeks    Status  New    Target Date  03/07/19      OT LONG TERM GOAL #4   Title  Pt. will independently wash his RUE using his LUE.    Baseline  Eval: Pt. has difficulty    Time  12    Period  Weeks    Status  On-going    Target Date  03/07/19      OT LONG TERM GOAL #5   Title  Pt. will improvie left hand [facility] skills  to be able to tie his shoes independently    Baseline  Eval: Pt. is unable    Time  12    Period  Weeks    Status  New    Target Date  03/07/19            Plan - 12/14/18 1217    Clinical Impression Statement  Pt. has been consistently using the theraputty at home, and is doing well with the exercises for the left hand. Pt. presents with limited LUE strength, decreased motor control, and impaired [facility] skills with compensation proximally and through the trunk duirng [facility] skills. Pt. continues to work on improving LUE strength, and [facility] skills in order to increase engagement of the LUE during ADLS, and IADL tasks.    OT Occupational Profile and History  Detailed Assessment- Review of Records and additional review of physical, cognitive, psychosocial history related to current functional performance    Occupational performance deficits (Please refer to evaluation for details):  ADL's;IADL's    Body Structure / Function / Physical Skills  ADL;IADL;FMC;ROM;UE functional use;Strength    Clinical Decision Making  Several treatment options, min-mod task modification necessary    Comorbidities Affecting Occupational Performance:  May have comorbidities impacting occupational performance    Modification or Assistance to Complete Evaluation   Min-Moderate modification of tasks or assist with assess necessary to complete eval    OT Frequency  2x / week    OT Duration  12 weeks    OT Treatment/Interventions   Self-care/ADL training;Therapeutic activities;Therapeutic exercise;Patient/family education;DME and/or AE instruction;Neuromuscular education    Consulted and Agree with Plan of  Care  Patient;Family member/caregiver    Family Member Consulted  Wife       Patient will benefit from skilled therapeutic intervention in order to improve the following deficits and impairments:   Body Structure / Function / Physical Skills: ADL, IADL, FMC, ROM, UE functional use, Strength       Visit Diagnosis: Muscle weakness (generalized)  Other lack of coordination    Problem List Patient Active Problem List   Diagnosis Date Noted  . CVA (cerebrovascular accident) ([place]) 11/17/2018  . Morbid obesity ([place]) 02/07/2015  . Knee pain 02/06/2015  . History of adenomatous polyp of colon 02/06/2015  . Acanthosis nigricans 02/06/2015  . Hyperlipidemia, mixed 01/12/2011  . 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 Pure hypercholesterolemia 03/13/2007  . Essential hypertension 03/13/2007  . Personal history of tobacco use 02/08/2007  . Cardiac enlargement 10/18/2006  . Myocardial infarct, old 09/18/2006  . Controlled diabetes mellitus with renal manifestations ([place]) 09/18/2006  . CAD (coronary artery disease) 09/18/2006    Harrel Carina, MS, OTR/L 12/14/2018, 12:22 PM  [place] MAIN [facility] SERVICES [address] [place], [place], [address] Phone: [phone]   Fax:  [phone]  Name: Todd Potter MRN: [id number] Date of Birth: [birth year]

## 2018-12-14 NOTE — Therapy (Signed)
[place] MAIN [facility] SERVICES [address] [place], [place], [address] Phone: [phone]   Fax:  [phone]  Physical Therapy Treatment  Patient Details  Name: Todd Potter MRN: [id number] Date of Birth: [birth year] Referring Provider (PT): [name] MD   Encounter Date: 12/14/2018  PT End of Session - 12/14/18 1025    Visit Number  4    Number of Visits  25    Date for PT Re-Evaluation  02/21/19    Authorization Type  initial eval: 11/29/18    PT Start Time  1015    PT Stop Time  1100    PT Time Calculation (min)  45 min    Equipment Utilized During Treatment  Gait belt    Activity Tolerance  Patient tolerated treatment well    Behavior During Therapy  [facility] for tasks assessed/performed       Past Medical History:  Diagnosis Date  . Acanthosis nigricans   . CAD (coronary artery disease)   . Cardiomegaly   . Diabetes mellitus without complication ([place])   . History of adenomatous polyp of colon   . Hyperlipidemia   . Hypertension   . Knee pain   . Myocardial infarct [facility])     Past Surgical History:  Procedure Laterality Date  . CARDIAC CATHETERIZATION     Stent Placement x 5 to RCA   . COLONOSCOPY WITH PROPOFOL N/A 08/06/2016   Procedure: COLONOSCOPY WITH PROPOFOL;  Surgeon: Manya Silvas, MD;  Location: [facility] ENDOSCOPY;  Service: Endoscopy;  Laterality: N/A;  . CYSTECTOMY  1970&1990   x2 on tail bone    There were no vitals filed for this visit.  Subjective Assessment - 12/14/18 1018    Subjective  Pt reports doing well today. He states that he thinks he is walking a little bit better and his grip strength has improved since starting PT and OT. No questions or concerns at this time.    Patient is accompained by:  Family member    Pertinent History  Pt reports getting up in the morning on 11/16/18 and upon waking noticed not having any strength in his left hand and his wife encouraged him to go to the hospital. He did  not experience and numbness or tingling just weakness in LUE. He did not notice any changes in strength or sensation LLE. The following day 11/17/18 he began to notice some toe drag of LLE and weakness. He called his doctor who encouraged him to go to the hospital in which he did. 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    Limitations  Walking    Patient Stated Goals  riding his motorcycle, fishing without difficulty, walking without difficulty    Currently in Pain?  No/denies         TREATMENT    Therapeutic Exercise  STS without UE support x10  Heel/toe raises 3s hold x10 bilaterally  Standing hip abduction with green tband x15 bilaterally; min cues for lateral trunk leaning  Standing hip extension with green tband x15 bilaterally; Standing hip flexion with green tband x15 bilaterally  Side stepping in // bars with green tband x5 lengths both ways  Split squat in // bars x10 each bilaterally    Neuromuscular Re-education  in // bars  Airex narrow stance EO/EC x30s each; cues for weight shift for better stance control  Airex alt toe taps to 4" step with 0-1 rail assist x15 bilaterally Standing one foot  on airex and one foot on 4" step standing balance with horizontal and vertical head turns x30s alt LE;  foam heel toe raises x15 each; rail assist for balance Airex modified tandem stance balance with ball raise up/down (2000 gr) x10 each alt LE Mini squat on airex x15 reps with cues for reaching forward for better stance control; requires min verbal cueing to improve weight shift to LLE for better neutral positioning and to increase LLE muscle activation Standing firm surface hedge hog taps for inc SLS x5 minutes; attempted on foam however too difficult     Pt educated throughout session about proper posture and technique with exercises. Improved exercise technique, movement at target joints, use of target muscles after min to mod verbal, visual, tactile cues.       Pt demonstrates good motivation during physical therapy session today. He requires verbal cueing for balance stability, including trunk control for less LOB with smaller base of support and tandem stance balance. He was min-mod challenged with balance exercises with horizontal head turns and when L LE had inc weight bearing requiring intermittent UE support and CGA-minA for steadying and safety. Pt demonstrates dec motor control in LLE requiring cues for improved knee stabilization with increased weight acceptance. He often demonstrates genu recurvatum with increased weight acceptance. HEP was updated today with the addition of standing global hip strengthening exercises with green theraband. Pt will benefit from PT services to address deficits in strength, balance, and mobility in order to return to full function at home.        PT Education - 12/14/18 1018    Education Details  Exercise technique    Person(s) Educated  Patient    Methods  Demonstration;Explanation;Tactile cues;Verbal cues    Comprehension  Returned demonstration;Verbalized understanding;Verbal cues required;Tactile cues required       PT Short Term Goals - 11/29/18 1241      PT SHORT TERM GOAL #1   Title  Pt will be independent with HEP in order to improve strength and balance in order to decrease fall risk and improve function at home and work.    Time  6    Period  Weeks    Status  New    Target Date  01/10/19        PT Long Term Goals - 11/29/18 1241      PT LONG TERM GOAL #1   Title  Pt will improve BERG by at least 3 points in order to demonstrate clinically significant improvement in balance.    Baseline  11/29/18: 50/56    Time  12    Period  Weeks    Status  New    Target Date  02/21/19      PT LONG TERM GOAL #2   Title  Pt will decrease 5TSTS by at least 3 seconds in order to demonstrate clinically significant improvement in LE strength.    Baseline  11/29/18: 13.66s    Time  12    Period   Weeks    Status  New    Target Date  02/21/19      PT LONG TERM GOAL #3   Title  Pt will decrease TUG to below 14 seconds/decrease in order to demonstrate decreased fall risk.    Baseline  11/29/18: 14.96s    Time  12    Period  Weeks    Status  New    Target Date  02/21/19      PT LONG TERM  GOAL #4   Title  Patient will improve walking speed to >1.0 m/s in order to increase safety for community ambulation.    Baseline  11/29/18: Self-selected: 16.5s =  0.61 m/s; Fastest: 11.4s = 0.88 m/s    Time  12    Period  Weeks    Status  New            Plan - 12/14/18 1026    Clinical Impression Statement  Pt demonstrates good motivation during physical therapy session today. He requires verbal cueing for balance stability, including trunk control for less LOB with smaller base of support and tandem stance balance. He was min-mod challenged with balance exercises with horizontal head turns and when L LE had inc weight bearing requiring intermittent UE support and CGA-minA for steadying and safety. Pt demonstrates dec motor control in LLE requiring cues for improved knee stabilization with increased weight acceptance. He often demonstrates genu recurvatum with increased weight acceptance. HEP was updated today with the addition of standing global hip strengthening exercises with green theraband. Pt will benefit from PT services to address deficits in strength, balance, and mobility in order to return to full function at home.    Personal Factors and Comorbidities  Comorbidity 3+;Age    Comorbidities  CAD, HTN, DM, hyperlipidemia    Examination-Activity Limitations  Squat;Locomotion Level;Stand    Examination-Participation Restrictions  Driving;Yard Work;Other   Leisure activities/fishing   Stability/Clinical Decision Making  Evolving/Moderate complexity    Rehab Potential  Good    PT Frequency  2x / week    PT Duration  12 weeks    PT Treatment/Interventions  ADLs/Self Care Home  Management;Aquatic Therapy;Biofeedback;Canalith Repostioning;Cryotherapy;Electrical Stimulation;Ultrasound;Moist Heat;Iontophoresis 4mg /ml Dexamethasone;Gait training;Stair training;Functional mobility training;Therapeutic activities;Therapeutic exercise;Balance training;Patient/family education;Neuromuscular re-education;Manual techniques;Joint Manipulations;Spinal Manipulations;Vestibular;Traction;DME Instruction;Cognitive remediation;Orthotic Fit/Training;Passive range of motion;Dry needling;Splinting    PT Next Visit Plan  begin strengthening and balance exercises    PT Home Exercise Plan  Access Code: [email or web address]    Consulted and Agree with Plan of Care  Patient;Family member/caregiver    Family Member Consulted  Spouse       Patient will benefit from skilled therapeutic intervention in order to improve the following deficits and impairments:  Abnormal gait, Decreased coordination, Difficulty walking, Impaired UE functional use, Decreased balance, Decreased mobility, Decreased strength  Visit Diagnosis: Muscle weakness (generalized)  Other lack of coordination  Acute left-sided weakness  Abnormal gait     Problem List Patient Active Problem List   Diagnosis Date Noted  . CVA (cerebrovascular accident) ([place]) 11/17/2018  . Morbid obesity ([place]) 02/07/2015  . Knee pain 02/06/2015  . History of adenomatous polyp of colon 02/06/2015  . Acanthosis nigricans 02/06/2015  . Hyperlipidemia, mixed 01/12/2011  . 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 Pure hypercholesterolemia 03/13/2007  . Essential hypertension 03/13/2007  . Personal history of tobacco use 02/08/2007  . Cardiac enlargement 10/18/2006  . Myocardial infarct, old 09/18/2006  . Controlled diabetes mellitus with renal manifestations ([place]) 09/18/2006  . CAD (coronary artery disease) 09/18/2006    This entire session was performed under direct supervision and direction of a licensed therapist/therapist assistant . I have  personally read, edited and approve of the note as written.    Elmyra Ricks Tyhesha Dutson SPT Phillips Grout PT, DPT, GCS  Huprich,Jason 12/14/2018, 4:44 PM  [place] MAIN [facility] SERVICES [address] [place], [place], [address] Phone: [phone]   Fax:  [phone]  Name: Todd Potter MRN: [id number] Date of  Birth: 23-May-1950

## 2018-12-18 ENCOUNTER — Ambulatory Visit: Payer: Medicare Other | Admitting: Occupational Therapy

## 2018-12-18 ENCOUNTER — Encounter: Payer: Self-pay | Admitting: Occupational Therapy

## 2018-12-18 ENCOUNTER — Ambulatory Visit: Payer: Medicare Other

## 2018-12-18 ENCOUNTER — Other Ambulatory Visit: Payer: Self-pay

## 2018-12-18 VITALS — BP 135/80 | HR 77

## 2018-12-18 DIAGNOSIS — R278 Other lack of coordination: Secondary | ICD-10-CM | POA: Diagnosis not present

## 2018-12-18 DIAGNOSIS — M6281 Muscle weakness (generalized): Secondary | ICD-10-CM | POA: Diagnosis not present

## 2018-12-18 DIAGNOSIS — R269 Unspecified abnormalities of gait and mobility: Secondary | ICD-10-CM

## 2018-12-18 DIAGNOSIS — R2681 Unsteadiness on feet: Secondary | ICD-10-CM | POA: Diagnosis not present

## 2018-12-18 DIAGNOSIS — R531 Weakness: Secondary | ICD-10-CM

## 2018-12-18 NOTE — Therapy (Signed)
[place] MAIN [facility] SERVICES [address] [place], [place], [address] Phone: [phone]   Fax:  [phone]  Physical Therapy Treatment  Patient Details  Name: Todd Potter MRN: [id number] Date of Birth: 1951/02/04 Referring Provider (PT): [name] MD   Encounter Date: 12/18/2018  PT End of Session - 12/18/18 1011    Visit Number  5    Number of Visits  25    Date for PT Re-Evaluation  02/21/19    Authorization Type  initial eval: 11/29/18    PT Start Time  1015    PT Stop Time  1100    PT Time Calculation (min)  45 min    Equipment Utilized During Treatment  Gait belt    Activity Tolerance  Patient tolerated treatment well    Behavior During Therapy  [facility] for tasks assessed/performed       Past Medical History:  Diagnosis Date  . Acanthosis nigricans   . CAD (coronary artery disease)   . Cardiomegaly   . Diabetes mellitus without complication ([place])   . History of adenomatous polyp of colon   . Hyperlipidemia   . Hypertension   . Knee pain   . Myocardial infarct [facility])     Past Surgical History:  Procedure Laterality Date  . CARDIAC CATHETERIZATION     Stent Placement x 5 to RCA   . COLONOSCOPY WITH PROPOFOL N/A 08/06/2016   Procedure: COLONOSCOPY WITH PROPOFOL;  Surgeon: Manya Silvas, MD;  Location: [facility] ENDOSCOPY;  Service: Endoscopy;  Laterality: N/A;  . CYSTECTOMY  1970&1990   x2 on tail bone    Vitals:   12/18/18 1019  BP: 135/80  Pulse: 77  SpO2: 98%    Subjective Assessment - 12/18/18 1010    Subjective  Pt reports doing well today. He is noticing improvement in her LUE strength including grip strength and finger opposition. No pain reported today. No questions or concerns at this time.    Patient is accompained by:  Family member    Pertinent History  Pt reports getting up in the morning on 11/16/18 and upon waking noticed not having any strength in his left hand and his wife encouraged him to go to  the hospital. He did not experience and numbness or tingling just weakness in LUE. He did not notice any changes in strength or sensation LLE. The following day 11/17/18 he began to notice some toe drag of LLE and weakness. He called his doctor who encouraged him to go to the hospital in which he did. 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    Limitations  Walking    Patient Stated Goals  riding his motorcycle, fishing without difficulty, walking without difficulty    Currently in Pain?  No/denies         TREATMENT   Therapeutic Exercise NuStep L1-2 x 4 minutes for warm-up during history (2 minutes unbilled); Quantum L single leg press 75# x 20, 90# x 20; Standing hip abduction with green tband x15bilaterally; Standing hip extension with green tband x15bilaterally; Standing hip flexion with green tband x15 bilaterally  STS without UE support from regular heigh chair with dynadisc under RLE x 10;    Electrical Stimulation  Russian NMES applied to L anterior tibialis, electrodes adjusted to balance DF and eversion. Utilized 44 mA to get sustained strong contraction with patient providing active contraction, 10s on/10s off with 2s ramp, 8 minutes total with intermittent cueing by  therapist;   Neuromuscular Re-education in // bars Heel/toe raises 3s hold x10 bilaterally; Semitandem progressing to full tandem balance without UE support alternating forward LE 2 x 30s each; Tandem gait on 2"x4" without UE support x 4 lengths; Sidestepping on 2"x4" without UE support x 4 lengths;   Pt educated throughout session about proper posture and technique with exercises. Improved exercise technique, movement at target joints, use of target muscles after min to mod verbal, visual, tactile cues.   Pt demonstrates good motivation during physical therapy session today. Utilized NMES today to work on improved L ankle DF strength. He initiates single leg press today and  will continue to progress. He demonstrates improving balance and is able to perform full tandem gait on 2"x4" in parallel bars with minimal UE support. Pt encouraged to continue HEP and will review at next session. Pt will benefit from PT services to address deficits in strength, balance, and mobility in order to return to full function at home.                          PT Short Term Goals - 11/29/18 1241      PT SHORT TERM GOAL #1   Title  Pt will be independent with HEP in order to improve strength and balance in order to decrease fall risk and improve function at home and work.    Time  6    Period  Weeks    Status  New    Target Date  01/10/19        PT Long Term Goals - 11/29/18 1241      PT LONG TERM GOAL #1   Title  Pt will improve BERG by at least 3 points in order to demonstrate clinically significant improvement in balance.    Baseline  11/29/18: 50/56    Time  12    Period  Weeks    Status  New    Target Date  02/21/19      PT LONG TERM GOAL #2   Title  Pt will decrease 5TSTS by at least 3 seconds in order to demonstrate clinically significant improvement in LE strength.    Baseline  11/29/18: 13.66s    Time  12    Period  Weeks    Status  New    Target Date  02/21/19      PT LONG TERM GOAL #3   Title  Pt will decrease TUG to below 14 seconds/decrease in order to demonstrate decreased fall risk.    Baseline  11/29/18: 14.96s    Time  12    Period  Weeks    Status  New    Target Date  02/21/19      PT LONG TERM GOAL #4   Title  Patient will improve walking speed to >1.0 m/s in order to increase safety for community ambulation.    Baseline  11/29/18: Self-selected: 16.5s =  0.61 m/s; Fastest: 11.4s = 0.88 m/s    Time  12    Period  Weeks    Status  New            Plan - 12/18/18 1011    Clinical Impression Statement  Pt demonstrates good motivation during physical therapy session today. Utilized NMES today to work on improved L ankle  DF strength. He initiates single leg press today and will continue to progress. He demonstrates improving balance and is able to perform full  tandem gait on 2"x4" in parallel bars with minimal UE support. Pt encouraged to continue HEP and will review at next session. Pt will benefit from PT services to address deficits in strength, balance, and mobility in order to return to full function at home.    Personal Factors and Comorbidities  Comorbidity 3+;Age    Comorbidities  CAD, HTN, DM, hyperlipidemia    Examination-Activity Limitations  Squat;Locomotion Level;Stand    Examination-Participation Restrictions  Driving;Yard Work;Other   Leisure activities/fishing   Stability/Clinical Decision Making  Evolving/Moderate complexity    Rehab Potential  Good    PT Frequency  2x / week    PT Duration  12 weeks    PT Treatment/Interventions  ADLs/Self Care Home Management;Aquatic Therapy;Biofeedback;Canalith Repostioning;Cryotherapy;Electrical Stimulation;Ultrasound;Moist Heat;Iontophoresis 4mg /ml Dexamethasone;Gait training;Stair training;Functional mobility training;Therapeutic activities;Therapeutic exercise;Balance training;Patient/family education;Neuromuscular re-education;Manual techniques;Joint Manipulations;Spinal Manipulations;Vestibular;Traction;DME Instruction;Cognitive remediation;Orthotic Fit/Training;Passive range of motion;Dry needling;Splinting    PT Next Visit Plan  Continue strengthening and balance exercises, utilize NMES for L ankle strength as appropriate    PT Home Exercise Plan  Access Code: [email or web address]    Consulted and Agree with Plan of Care  Patient;Family member/caregiver    Family Member Consulted  Spouse       Patient will benefit from skilled therapeutic intervention in order to improve the following deficits and impairments:  Abnormal gait, Decreased coordination, Difficulty walking, Impaired UE functional use, Decreased balance, Decreased mobility, Decreased strength  Visit  Diagnosis: Muscle weakness (generalized)  Acute left-sided weakness  Other lack of coordination  Abnormal gait     Problem List Patient Active Problem List   Diagnosis Date Noted  . CVA (cerebrovascular accident) ([place]) 11/17/2018  . Morbid obesity ([place]) 02/07/2015  . Knee pain 02/06/2015  . History of adenomatous polyp of colon 02/06/2015  . Acanthosis nigricans 02/06/2015  . Hyperlipidemia, mixed 01/12/2011  . 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 Pure hypercholesterolemia 03/13/2007  . Essential hypertension 03/13/2007  . Personal history of tobacco use 02/08/2007  . Cardiac enlargement 10/18/2006  . Myocardial infarct, old 09/18/2006  . Controlled diabetes mellitus with renal manifestations ([place]) 09/18/2006  . CAD (coronary artery disease) 09/18/2006    Phillips Grout PT, DPT, GCS  Canio Winokur 12/18/2018, 12:53 PM  [place] MAIN [facility] SERVICES [address] [place], [place], [address] Phone: [phone]   Fax:  [phone]  Name: JADARIAN HUTSON MRN: [id number] Date of Birth: [birth year]

## 2018-12-18 NOTE — Therapy (Signed)
[place] [facility]IN [facility] SERVICES [address] [place], [place], [address] Phone: [phone]   Fax:  [phone]  Occupational Therapy Treatment  Patient Details  Name: Todd Potter MRN: [id number] Date of Birth: September 28, 1950 Referring Provider (OT): Dr. Caryn Section   Encounter Date: 12/18/2018  OT End of Session - 12/18/18 1432    Visit Number  4    Number of Visits  24    Date for OT Re-Evaluation  03/07/19    OT Start Time  1105    OT Stop Time  1145    OT Time Calculation (min)  40 min    Activity Tolerance  Patient tolerated treatment well    Behavior During Therapy  [facility] for tasks assessed/performed       Past Medical History:  Diagnosis Date  . Acanthosis nigricans   . CAD (coronary artery disease)   . Cardiomegaly   . Diabetes mellitus without complication ([place])   . History of adenomatous polyp of colon   . Hyperlipidemia   . Hypertension   . Knee pain   . Myocardial infarct [facility])     Past Surgical History:  Procedure Laterality Date  . CARDIAC CATHETERIZATION     Stent Placement x 5 to RCA   . COLONOSCOPY WITH PROPOFOL N/A 08/06/2016   Procedure: COLONOSCOPY WITH PROPOFOL;  Surgeon: Manya Silvas, MD;  Location: [facility] ENDOSCOPY;  Service: Endoscopy;  Laterality: N/A;  . CYSTECTOMY  1970&1990   x2 on tail bone    There were no vitals filed for this visit.  Subjective Assessment - 12/18/18 1432    Subjective   Pt. reports doing well today    Patient is accompanied by:  Family member    Pertinent History  Pt. is a 68 y.o. male who was diagnosed with a CVA with Left sided weakness. Pt. resides with his wife, and was previously independent with ADLs, and IADLs prior to onset of the CVA. 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 Pt. enjoying riding motorcycles with his wife.    Patient Stated Goals  To regain use of his LUE    Currently in Pain?  No/denies      OT TREATMENT    Neuro muscular  re-education:  Pt. worked on using his left hand for grasping, and manipulating 1/2" washers from a magnetic dish using point grasp pattern. Pt. worked on reaching up, stabilizing, and sustaining shoulder elevation while placing the washer over a small precise target on vertical dowels positioned at various angles. Pt. worked on grasping coins from a tabletop surface, placing them into a resistive container, and pushing them through the slot while isolating his 2nd digit. Pt. Required cues to avoid compensating proximally, and leaning to the right. Cues for keeping shoulders symmetrical.  Therapeutic Exercise:  Pt. performed gross gripping with the handheld grip strengthener. Pt. worked on sustaining grip while grasping pegs and reaching at various heights. The gripper was set at 17.9# grip strength force. Pt. worked on pinch strengthening in the left hand for lateral, and 3pt. pinch using yellow, red, green, and blue resistive clips. Pt. worked on placing the clips at various vertical and horizontal angles. Tactile and verbal cues were required for eliciting the desired movement. Pt. worked on grasping the clips from the tabletop, and moving the clips through his hand to reposition, and prepare to place the clips them onto a small handheld dowel.  OT Long Term Goals - 12/06/18 1748      OT LONG TERM GOAL #1   Title  Pt. will improve grip strength by 10# to be able to sustain a firm hold on a motorcycle clutch.    Baseline  Eval: Pt. is unable    Time  12    Period  Weeks    Status  New    Target Date  03/07/19      OT LONG TERM GOAL #2   Title  Pt. will improve lateral pinch strength by 3# to be able to hold a plate steady.    Baseline  Eval: Pt. has difficulty hold a plate steady, and carrying it.    Time  12    Period  Weeks    Status  New    Target Date  03/07/19      OT LONG TERM GOAL #3   Title  Pt. will independently hike the left  side of his pants.    Baseline  Eval: Pt. is unable    Time  12    Period  Weeks    Status  New    Target Date  03/07/19      OT LONG TERM GOAL #4   Title  Pt. will independently wash his RUE using his LUE.    Baseline  Eval: Pt. has difficulty    Time  12    Period  Weeks    Status  On-going    Target Date  03/07/19      OT LONG TERM GOAL #5   Title  Pt. will improvie left hand [facility] skills  to be able to tie his shoes independently    Baseline  Eval: Pt. is unable    Time  12    Period  Weeks    Status  New    Target Date  03/07/19            Plan - 12/18/18 1433    Clinical Impression Statement  Pt. is making progress with LUE functioning., and is using his hand more duirng daily tasks at home. Pt. continues to present with limited LUE strength, grip strength, pinch strength, and [place] skills. Pt. continues to work on improving Left hand function in order to improve engagement during ADLs, and IADL functioning.    OT Occupational Profile and History  Detailed Assessment- Review of Records and additional review of physical, cognitive, psychosocial history related to current functional performance    Occupational performance deficits (Please refer to evaluation for details):  ADL's;IADL's    Body Structure / Function / Physical Skills  ADL;IADL;FMC;ROM;UE functional use;Strength    Clinical Decision Making  Several treatment options, min-mod task modification necessary    Comorbidities Affecting Occupational Performance:  May have comorbidities impacting occupational performance    Modification or Assistance to Complete Evaluation   Min-Moderate modification of tasks or assist with assess necessary to complete eval    OT Frequency  2x / week    OT Duration  12 weeks    OT Treatment/Interventions  Self-care/ADL training;Therapeutic activities;Therapeutic exercise;Patient/family education;DME and/or AE instruction;Neuromuscular education    Consulted and Agree with Plan of Care   Patient;Family member/caregiver    Family Member Consulted  Wife       Patient will benefit from skilled therapeutic intervention in order to improve the following deficits and impairments:   Body Structure / Function / Physical Skills: ADL, IADL, FMC, ROM, UE functional use, Strength  Visit Diagnosis: Muscle weakness (generalized)  Other lack of coordination    Problem List Patient Active Problem List   Diagnosis Date Noted  . CVA (cerebrovascular accident) ([place]) 11/17/2018  . Morbid obesity ([place]) 02/07/2015  . Knee pain 02/06/2015  . History of adenomatous polyp of colon 02/06/2015  . Acanthosis nigricans 02/06/2015  . Hyperlipidemia, mixed 01/12/2011  . 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 Pure hypercholesterolemia 03/13/2007  . Essential hypertension 03/13/2007  . Personal history of tobacco use 02/08/2007  . Cardiac enlargement 10/18/2006  . Myocardial infarct, old 09/18/2006  . Controlled diabetes mellitus with renal manifestations ([place]) 09/18/2006  . CAD (coronary artery disease) 09/18/2006    Harrel Carina, MS, OTR/L 12/18/2018, 2:58 PM  [place] MAIN [facility] SERVICES [address] [place], [place], [address] Phone: [phone]   Fax:  [phone]  Name: DYMIR GHANNAM MRN: [id number] Date of Birth: 1951/04/04

## 2018-12-20 ENCOUNTER — Other Ambulatory Visit: Payer: Self-pay

## 2018-12-20 ENCOUNTER — Ambulatory Visit: Payer: Medicare Other | Admitting: Occupational Therapy

## 2018-12-20 ENCOUNTER — Telehealth: Payer: Self-pay

## 2018-12-20 ENCOUNTER — Encounter: Payer: Self-pay | Admitting: Family Medicine

## 2018-12-20 ENCOUNTER — Ambulatory Visit: Payer: Medicare Other | Attending: Family Medicine

## 2018-12-20 ENCOUNTER — Ambulatory Visit (INDEPENDENT_AMBULATORY_CARE_PROVIDER_SITE_OTHER): Payer: Medicare Other | Admitting: Family Medicine

## 2018-12-20 VITALS — BP 150/76 | HR 89

## 2018-12-20 DIAGNOSIS — M6281 Muscle weakness (generalized): Secondary | ICD-10-CM

## 2018-12-20 DIAGNOSIS — R278 Other lack of coordination: Secondary | ICD-10-CM | POA: Diagnosis not present

## 2018-12-20 DIAGNOSIS — I499 Cardiac arrhythmia, unspecified: Secondary | ICD-10-CM

## 2018-12-20 DIAGNOSIS — I639 Cerebral infarction, unspecified: Secondary | ICD-10-CM | POA: Diagnosis not present

## 2018-12-20 DIAGNOSIS — R269 Unspecified abnormalities of gait and mobility: Secondary | ICD-10-CM | POA: Diagnosis not present

## 2018-12-20 DIAGNOSIS — R2681 Unsteadiness on feet: Secondary | ICD-10-CM | POA: Diagnosis not present

## 2018-12-20 DIAGNOSIS — R531 Weakness: Secondary | ICD-10-CM | POA: Insufficient documentation

## 2018-12-20 MED ORDER — METOPROLOL SUCCINATE ER 200 MG PO TB24: 200.0000 mg | ORAL_TABLET | Freq: Every day | ORAL | 3 refills | Status: DC

## 2018-12-20 NOTE — Therapy (Signed)
[place] MAIN [facility] SERVICES [address] [place], [place], [address] Phone: [phone]   Fax:  [phone]  Physical Therapy Treatment  Patient Details  Name: Todd Potter MRN: [id number] Date of Birth: 1950-12-17 Referring Provider (PT): [name] MD   Encounter Date: 12/20/2018  PT End of Session - 12/20/18 1054    Visit Number  6    Number of Visits  25    Date for PT Re-Evaluation  02/21/19    Authorization Type  initial eval: 11/29/18    PT Start Time  1015    PT Stop Time  1050    PT Time Calculation (min)  35 min    Equipment Utilized During Treatment  Gait belt    Activity Tolerance  Patient tolerated treatment well    Behavior During Therapy  [facility] for tasks assessed/performed       Past Medical History:  Diagnosis Date  . Acanthosis nigricans   . CAD (coronary artery disease)   . Cardiomegaly   . Diabetes mellitus without complication ([place])   . History of adenomatous polyp of colon   . Hyperlipidemia   . Hypertension   . Knee pain   . Myocardial infarct [facility])     Past Surgical History:  Procedure Laterality Date  . CARDIAC CATHETERIZATION     Stent Placement x 5 to RCA   . COLONOSCOPY WITH PROPOFOL N/A 08/06/2016   Procedure: COLONOSCOPY WITH PROPOFOL;  Surgeon: Manya Silvas, MD;  Location: [facility] ENDOSCOPY;  Service: Endoscopy;  Laterality: N/A;  . CYSTECTOMY  1970&1990   x2 on tail bone    Vitals:   12/20/18 1018  BP: (!) 150/76  Pulse: 89  SpO2: 98%    Subjective Assessment - 12/20/18 1054    Subjective  Pt reports doing well today. No pain reported today. No questions or concerns at this time. He believes that he is getting stronger    Patient is accompained by:  Family member    Pertinent History  Pt reports getting up in the morning on 11/16/18 and upon waking noticed not having any strength in his left hand and his wife encouraged him to go to the hospital. He did not experience and numbness or  tingling just weakness in LUE. He did not notice any changes in strength or sensation LLE. The following day 11/17/18 he began to notice some toe drag of LLE and weakness. He called his doctor who encouraged him to go to the hospital in which he did. 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    Limitations  Walking    Patient Stated Goals  riding his motorcycle, fishing without difficulty, walking without difficulty    Currently in Pain?  No/denies         TREATMENT   Therapeutic Exercise Vitals assessed NuStep L1-2 x 5 minutes for warm-up during history; Discussed interval history in sitting as well as HEP progression and activity level;  Electrical Stimulation  Russian NMES applied to L anterior tibialis, electrodes adjusted to balance DF and eversion. Utilized 42 mA to get sustained strong contraction with patient providing active contraction, 10s on/10s off with 2s ramp, 8 minutes total with intermittent cueing by therapist;  Pt educated throughout session about proper posture and technique with exercises. Improved exercise technique, movement at target joints, use of target muscles after min to mod verbal, visual, tactile cues.   Pt arrived for physical therapy and during intake vitals pulse  noted to be irregularly irregular. Pt is asymptomatic and denies any chest pain, DOE, or SOB. Pt is on a 325mg  ASA. Consulted with cardiac RN who also confirmed irregular rhythm by radial palpation and apical ascultation. RN advised PT to contact PCP and notify of findings. Therapist reviewed records from cardiologist without any indication of previously diagnosed arrhythmia. Therapist called PCP, Dr. [name], and during phone call therapist utilized NMES with patient to L anterior tibialis. Pt performing active contraction as well during NMES. By the time pt is done with NMES therapist had spoken with PCP who advised pt to come over for an appointment. Therapy  session ended early and pt sent to PCP office. Pt encouraged to continue HEP and will review at next session. Pt will benefit from PT services to address deficits in strength, balance, and mobility in order to return to full function at home.                        PT Short Term Goals - 11/29/18 1241      PT SHORT TERM GOAL #1   Title  Pt will be independent with HEP in order to improve strength and balance in order to decrease fall risk and improve function at home and work.    Time  6    Period  Weeks    Status  New    Target Date  01/10/19        PT Long Term Goals - 11/29/18 1241      PT LONG TERM GOAL #1   Title  Pt will improve BERG by at least 3 points in order to demonstrate clinically significant improvement in balance.    Baseline  11/29/18: 50/56    Time  12    Period  Weeks    Status  New    Target Date  02/21/19      PT LONG TERM GOAL #2   Title  Pt will decrease 5TSTS by at least 3 seconds in order to demonstrate clinically significant improvement in LE strength.    Baseline  11/29/18: 13.66s    Time  12    Period  Weeks    Status  New    Target Date  02/21/19      PT LONG TERM GOAL #3   Title  Pt will decrease TUG to below 14 seconds/decrease in order to demonstrate decreased fall risk.    Baseline  11/29/18: 14.96s    Time  12    Period  Weeks    Status  New    Target Date  02/21/19      PT LONG TERM GOAL #4   Title  Patient will improve walking speed to >1.0 m/s in order to increase safety for community ambulation.    Baseline  11/29/18: Self-selected: 16.5s =  0.61 m/s; Fastest: 11.4s = 0.88 m/s    Time  12    Period  Weeks    Status  New            Plan - 12/20/18 1055    Clinical Impression Statement  Pt arrived for physical therapy and during intake vitals pulse noted to be irregularly irregular. Pt is asymptomatic and denies any chest pain, DOE, or SOB. Pt is on a 325mg  ASA. Consulted with cardiac RN who also confirmed  irregular rhythm by radial palpation and apical ascultation. RN advised PT to contact PCP and notify of findings. Therapist reviewed records  from cardiologist without any indication of previously diagnosed arrhythmia. Therapist called PCP, Dr. [name], and during phone call therapist utilized NMES with patient to L anterior tibialis. Pt performing active contraction as well during NMES. By the time pt is done with NMES therapist had spoken with PCP who advised pt to come over for an appointment. Therapy session ended early and pt sent to PCP office. Pt encouraged to continue HEP and will review at next session. Pt will benefit from PT services to address deficits in strength, balance, and mobility in order to return to full function at home.    Personal Factors and Comorbidities  Comorbidity 3+;Age    Comorbidities  CAD, HTN, DM, hyperlipidemia    Examination-Activity Limitations  Squat;Locomotion Level;Stand    Examination-Participation Restrictions  Driving;Yard Work;Other   Leisure activities/fishing   Stability/Clinical Decision Making  Evolving/Moderate complexity    Rehab Potential  Good    PT Frequency  2x / week    PT Duration  12 weeks    PT Treatment/Interventions  ADLs/Self Care Home Management;Aquatic Therapy;Biofeedback;Canalith Repostioning;Cryotherapy;Electrical Stimulation;Ultrasound;Moist Heat;Iontophoresis 4mg /ml Dexamethasone;Gait training;Stair training;Functional mobility training;Therapeutic activities;Therapeutic exercise;Balance training;Patient/family education;Neuromuscular re-education;Manual techniques;Joint Manipulations;Spinal Manipulations;Vestibular;Traction;DME Instruction;Cognitive remediation;Orthotic Fit/Training;Passive range of motion;Dry needling;Splinting    PT Next Visit Plan  Continue strengthening and balance exercises, utilize NMES for L ankle strength as appropriate    PT Home Exercise Plan  Access Code: [email or web address]    Consulted and Agree with Plan of Care   Patient;Family member/caregiver    Family Member Consulted  Spouse       Patient will benefit from skilled therapeutic intervention in order to improve the following deficits and impairments:  Abnormal gait, Decreased coordination, Difficulty walking, Impaired UE functional use, Decreased balance, Decreased mobility, Decreased strength  Visit Diagnosis: Muscle weakness (generalized)  Acute left-sided weakness  Unsteadiness on feet  Other lack of coordination     Problem List Patient Active Problem List   Diagnosis Date Noted  . CVA (cerebrovascular accident) ([place]) 11/17/2018  . Morbid obesity ([place]) 02/07/2015  . Knee pain 02/06/2015  . History of adenomatous polyp of colon 02/06/2015  . Acanthosis nigricans 02/06/2015  . Hyperlipidemia, mixed 01/12/2011  . 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 Pure hypercholesterolemia 03/13/2007  . Essential hypertension 03/13/2007  . Personal history of tobacco use 02/08/2007  . Cardiac enlargement 10/18/2006  . Myocardial infarct, old 09/18/2006  . Controlled diabetes mellitus with renal manifestations ([place]) 09/18/2006  . CAD (coronary artery disease) 09/18/2006   Phillips Grout PT, DPT, GCS  Jaivyn Gulla 12/20/2018, 11:12 AM  [place] MAIN [facility] SERVICES [address] [place], [place], [address] Phone: [phone]   Fax:  [phone]  Name: MANA REUTER MRN: [id number] Date of Birth: Mar 11, 1951

## 2018-12-20 NOTE — Progress Notes (Signed)
Patient: Todd Potter Male    DOB: 12-27-1950   68 y.o.   MRN: [id number] Visit Date: 12/20/2018  Today's Provider: [name], MD   Chief Complaint  Patient presents with  . Irregular Heart Beat   Subjective:     HPI  Patient comes in office today with concerns of irregular heart rate. Patient was sent over by PT at [facility] due to irregularity with heart rhthym at time of therapy. Patient denies symptoms of chest pain/pressure or shortness of breath.  No Known Allergies   Current Outpatient Medications:  .  amLODipine (NORVASC) 10 MG tablet, Take 1 tablet (10 mg total) by mouth daily., Disp: 90 tablet, Rfl: 4 .  aspirin EC 325 MG EC tablet, Take 1 tablet (325 mg total) by mouth daily., Disp: 30 tablet, Rfl: 0 .  atorvastatin (LIPITOR) 80 MG tablet, TAKE 1 TABLET DAILY, Disp: 90 tablet, Rfl: 3 .  empagliflozin (JARDIANCE) 25 MG TABS tablet, TAKE 25 MG BY MOUTH DAILY. (NOT COVER- CONTACTING MD), Disp: 7 tablet, Rfl: 0 .  glipiZIDE (GLUCOTROL XL) 10 MG 24 hr tablet, TAKE 1 TABLET DAILY, Disp: 90 tablet, Rfl: 3 .  losartan (COZAAR) 100 MG tablet, TAKE 1 TABLET BY MOUTH EVERY DAY, Disp: 90 tablet, Rfl: 4 .  metoprolol (TOPROL-XL) 200 MG 24 hr tablet, TAKE 1 TABLET BY MOUTH EVERY DAY, Disp: 90 tablet, Rfl: 0 .  nitroGLYCERIN (NITROSTAT) 0.4 MG SL tablet, Place 1 tablet under the tongue. 3-5 minutes x3 as nedded, Disp: , Rfl:  .  sitaGLIPtin-metformin (JANUMET) 50-1000 MG tablet, Take 1 tablet by mouth 2 (two) times daily., Disp: 28 tablet, Rfl: 0 .  tadalafil (CIALIS) 5 MG tablet, Take 1 tablet (5 mg total) by mouth every other day as needed for erectile dysfunction., Disp: 30 tablet, Rfl: 5 .  potassium chloride SA (K-DUR) 20 MEQ tablet, Take 1 tablet (20 mEq total) by mouth daily for 5 days., Disp: 5 tablet, Rfl: 0  Review of Systems  Constitutional: Negative.   HENT: Negative.   Respiratory: Negative.   Cardiovascular: Negative.   Neurological: Negative.     Social  History   Tobacco Use  . Smoking status: Former Smoker    Packs/day: 0.50    Years: 30.00    Pack years: 15.00    Types: Cigarettes    Quit date: 04/19/2006    Years since quitting: 12.6  . Smokeless tobacco: Never Used  Substance Use Topics  . Alcohol use: Yes    Alcohol/week: 0.0 standard drinks    Comment: drinks 1-2 beer monthly      Objective:      Physical Exam   General Appearance:    Alert, cooperative, no distress  Eyes:    PERRL, conjunctiva/corneas clear, EOM's intact       Lungs:     Clear to auscultation bilaterally, respirations unlabored  Heart:    Normal heart rate. Occasional skipped beats. No murmurs, rubs, or gallops.   MS:   All extremities are intact.   Neurologic:   Awake, alert, oriented x 3. No apparent focal neurological           defect.      EKG NSR      Assessment & Plan    1. Irregular heartbeat Normal EKG today, but occasional skipped beats on auscultations. He reports history of irregular heart beat and did have PACs on prior EKGS, which are likely responsible for skipped beats on auscultation.  No episodes of a-fib were mentioned during his recent hospitalization for CVA. He is completely asymptomatic so no additional evaluation is indicated at this time.  - EKG 12-Lead  The entirety of the information documented in the History of Present Illness, Review of Systems and Physical Exam were personally obtained by me. Portions of this information were initially documented by Minette Headland, CMA and reviewed by me for thoroughness and accuracy.      [name], MD  [facility]

## 2018-12-21 NOTE — Patient Instructions (Signed)
.   Please review the attached list of medications and notify my office if there are any errors.   . Please bring all of your medications to every appointment so we can make sure that our medication list is the same as yours.   . It is especially important to get the annual flu vaccine this year. If you haven't had it already, please go to your pharmacy or call the office as soon as possible to schedule you flu shot.  

## 2018-12-27 ENCOUNTER — Ambulatory Visit: Payer: Medicare Other | Admitting: Physical Therapy

## 2018-12-27 ENCOUNTER — Encounter: Payer: Self-pay | Admitting: Physical Therapy

## 2018-12-27 ENCOUNTER — Ambulatory Visit: Payer: Medicare Other | Admitting: Occupational Therapy

## 2018-12-27 ENCOUNTER — Encounter: Payer: Self-pay | Admitting: Occupational Therapy

## 2018-12-27 ENCOUNTER — Other Ambulatory Visit: Payer: Self-pay

## 2018-12-27 DIAGNOSIS — R278 Other lack of coordination: Secondary | ICD-10-CM

## 2018-12-27 DIAGNOSIS — R2681 Unsteadiness on feet: Secondary | ICD-10-CM | POA: Diagnosis not present

## 2018-12-27 DIAGNOSIS — R269 Unspecified abnormalities of gait and mobility: Secondary | ICD-10-CM | POA: Diagnosis not present

## 2018-12-27 DIAGNOSIS — R531 Weakness: Secondary | ICD-10-CM | POA: Diagnosis not present

## 2018-12-27 DIAGNOSIS — M6281 Muscle weakness (generalized): Secondary | ICD-10-CM

## 2018-12-27 NOTE — Therapy (Signed)
[place] MAIN [facility] SERVICES [address] [place], [place], [address] Phone: [phone]   Fax:  [phone]  Physical Therapy Treatment  Patient Details  Name: Todd Potter MRN: [id number] Date of Birth: November 05, 1950 Referring Provider (PT): [name] MD   Encounter Date: 12/27/2018  PT End of Session - 12/27/18 1154    Visit Number  7    Number of Visits  25    Date for PT Re-Evaluation  02/21/19    Authorization Type  initial eval: 11/29/18    PT Start Time  1149    PT Stop Time  1230    PT Time Calculation (min)  41 min    Equipment Utilized During Treatment  Gait belt    Activity Tolerance  Patient tolerated treatment well    Behavior During Therapy  [facility] for tasks assessed/performed       Past Medical History:  Diagnosis Date  . Acanthosis nigricans   . CAD (coronary artery disease)   . Cardiomegaly   . Diabetes mellitus without complication ([place])   . History of adenomatous polyp of colon   . Hyperlipidemia   . Hypertension   . Knee pain   . Myocardial infarct [facility])     Past Surgical History:  Procedure Laterality Date  . CARDIAC CATHETERIZATION     Stent Placement x 5 to RCA   . COLONOSCOPY WITH PROPOFOL N/A 08/06/2016   Procedure: COLONOSCOPY WITH PROPOFOL;  Surgeon: Manya Silvas, MD;  Location: [facility] ENDOSCOPY;  Service: Endoscopy;  Laterality: N/A;  . CYSTECTOMY  1970&1990   x2 on tail bone    There were no vitals filed for this visit.  Subjective Assessment - 12/27/18 1153    Subjective  Patient reports doing okay; Reports continued soreness in right knee; Denies any new falls; Reports adherence with HEP;    Patient is accompained by:  Family member    Pertinent History  Pt reports getting up in the morning on 11/16/18 and upon waking noticed not having any strength in his left hand and his wife encouraged him to go to the hospital. He did not experience and numbness or tingling just weakness in LUE. He did  not notice any changes in strength or sensation LLE. The following day 11/17/18 he began to notice some toe drag of LLE and weakness. He called his doctor who encouraged him to go to the hospital in which he did. 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    Limitations  Walking    Patient Stated Goals  riding his motorcycle, fishing without difficulty, walking without difficulty    Currently in Pain?  Yes    Pain Score  4     Pain Location  Knee    Pain Orientation  Right    Pain Descriptors / Indicators  Aching;Sore    Pain Type  Chronic pain    Pain Onset  More than a month ago    Pain Frequency  Intermittent    Aggravating Factors   worse at night with sleep disturbances    Pain Relieving Factors  rest    Effect of Pain on Daily Activities  decreased activity tolerance;    Multiple Pain Sites  No           TREATMENT: Exercise:  Warm up on Nustep BUE/BLE level 2 x4 min (with history intake, 2 min Unbilled);  Leg press: LLE only 75# x12, 90# x10 with min VCs  to slow down eccentric control and to avoid genu recurvatum for better motor control;   Standing with green tband behind LLE: terminal knee extension x15 reps with min VCs for proper technique for optimal muscle control; Patient had difficulty isolating LLE knee movement often trying to shift weight between legs;   Standing with back to wall: Terminal knee extension against small ball 3 sec hold x15 reps with min VCs for proper positioning;  Standing with green tband around BLE: RLE hip abduction x10 reps, RLE hip extension x10 reps with mod VCs for weight shift to LLE and to keep knee straight isolating knee control with SLS task;    Gait training: Patient ambulates with reciprocal gait pattern, no AD, decreased step length LLE with knee hyperextension on LLE during mid stance and decreased ankle DF at heel strike with slight foot slap;  Patient exhibits increased LLE knee instability during mid  stance tasks leading to gait instability and decreased step length;  Instructed patient to increase LLE knee extension at terminal swing/initial contact for better knee stabilization upon weight acceptance;  Patient in mid stance position, forward/backward weight shift with cues for knee extension and erect posture for proper weight shift x5 reps x3 sets each foot in front;   Despite instruction for knee extension at initial contact, patient continues to ambulate with unstable LLE knee with increased genu recuvatum during mid stance. He was able to exhibit improved step length following gait instruction;            Pt educated throughout session about proper posture and technique with exercises. Improved exercise technique, movement at target joints, use of target muscles after min to mod verbal, visual, tactile cues.  Response to treatment:  Patient tolerated well; he does exhibit decreased motor control in LLE requiring cues for improved knee stabilization with increased weight acceptance; He denies any increase in pain at end of session; he was able to verbalize and demonstrate understanding with advanced HEP                       PT Education - 12/27/18 1154    Education Details  exercise technique/HEP    Person(s) Educated  Patient    Methods  Explanation;Verbal cues    Comprehension  Verbalized understanding;Returned demonstration;Verbal cues required;Need further instruction       PT Short Term Goals - 11/29/18 1241      PT SHORT TERM GOAL #1   Title  Pt will be independent with HEP in order to improve strength and balance in order to decrease fall risk and improve function at home and work.    Time  6    Period  Weeks    Status  New    Target Date  01/10/19        PT Long Term Goals - 11/29/18 1241      PT LONG TERM GOAL #1   Title  Pt will improve BERG by at least 3 points in order to demonstrate clinically significant improvement in balance.     Baseline  11/29/18: 50/56    Time  12    Period  Weeks    Status  New    Target Date  02/21/19      PT LONG TERM GOAL #2   Title  Pt will decrease 5TSTS by at least 3 seconds in order to demonstrate clinically significant improvement in LE strength.    Baseline  11/29/18: 13.66s  Time  12    Period  Weeks    Status  New    Target Date  02/21/19      PT LONG TERM GOAL #3   Title  Pt will decrease TUG to below 14 seconds/decrease in order to demonstrate decreased fall risk.    Baseline  11/29/18: 14.96s    Time  12    Period  Weeks    Status  New    Target Date  02/21/19      PT LONG TERM GOAL #4   Title  Patient will improve walking speed to >1.0 m/s in order to increase safety for community ambulation.    Baseline  11/29/18: Self-selected: 16.5s =  0.61 m/s; Fastest: 11.4s = 0.88 m/s    Time  12    Period  Weeks    Status  New            Plan - 12/27/18 1413    Clinical Impression Statement  Patient tolerated session fair. Instructed patient in advanced LLE knee stabilization exercise to improve knee control in stance. Patient exhibits increased genu recuvatum and poor knee control during weight bearing. Patient instructed in advanced HEP for knee stabilization for better stance control. He would benefit from additional skilled PT intervention to improve strength, balance and gait safety;    Personal Factors and Comorbidities  Comorbidity 3+;Age    Comorbidities  CAD, HTN, DM, hyperlipidemia    Examination-Activity Limitations  Squat;Locomotion Level;Stand    Examination-Participation Restrictions  Driving;Yard Work;Other   Leisure activities/fishing   Stability/Clinical Decision Making  Evolving/Moderate complexity    Rehab Potential  Good    PT Frequency  2x / week    PT Duration  12 weeks    PT Treatment/Interventions  ADLs/Self Care Home Management;Aquatic Therapy;Biofeedback;Canalith Repostioning;Cryotherapy;Electrical Stimulation;Ultrasound;Moist Heat;Iontophoresis  4mg /ml Dexamethasone;Gait training;Stair training;Functional mobility training;Therapeutic activities;Therapeutic exercise;Balance training;Patient/family education;Neuromuscular re-education;Manual techniques;Joint Manipulations;Spinal Manipulations;Vestibular;Traction;DME Instruction;Cognitive remediation;Orthotic Fit/Training;Passive range of motion;Dry needling;Splinting    PT Next Visit Plan  Continue strengthening and balance exercises, utilize NMES for L ankle strength as appropriate    PT Home Exercise Plan  Access Code: [email or web address]    Consulted and Agree with Plan of Care  Patient;Family member/caregiver    Family Member Consulted  Spouse       Patient will benefit from skilled therapeutic intervention in order to improve the following deficits and impairments:  Abnormal gait, Decreased coordination, Difficulty walking, Impaired UE functional use, Decreased balance, Decreased mobility, Decreased strength  Visit Diagnosis: Muscle weakness (generalized)  Acute left-sided weakness  Unsteadiness on feet  Other lack of coordination     Problem List Patient Active Problem List   Diagnosis Date Noted  . CVA (cerebrovascular accident) ([place]) 11/17/2018  . Morbid obesity ([place]) 02/07/2015  . Knee pain 02/06/2015  . History of adenomatous polyp of colon 02/06/2015  . Acanthosis nigricans 02/06/2015  . Hyperlipidemia, mixed 01/12/2011  . 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 Pure hypercholesterolemia 03/13/2007  . Essential hypertension 03/13/2007  . Personal history of tobacco use 02/08/2007  . Cardiac enlargement 10/18/2006  . Myocardial infarct, old 09/18/2006  . Controlled diabetes mellitus with renal manifestations ([place]) 09/18/2006  . CAD (coronary artery disease) 09/18/2006    Tyri Elmore PT, DPT 12/27/2018, 2:15 PM  [place] MAIN [facility] SERVICES [address] [place], [place], [address] Phone: [phone]   Fax:   [phone]  Name: Todd Potter MRN: [id number] Date of Birth: 22-Jul-1950

## 2018-12-27 NOTE — Patient Instructions (Signed)
Access Code: [email or web address]  URL: [email or web address]  Date: 12/27/2018  Prepared by: Blanche East   Exercises  Standing Terminal Knee Extension with Resistance - 15 reps - 2 sets - 1x daily - 7x weekly

## 2018-12-27 NOTE — Therapy (Signed)
[place] MAIN [facility] SERVICES [address] [place], [place], [address] Phone: [phone]   Fax:  [phone]  Occupational Therapy Treatment  Patient Details  Name: Todd Potter MRN: [id number] Date of Birth: 06-02-1950 Referring Provider (OT): Dr. Caryn Section   Encounter Date: 12/27/2018  OT End of Session - 12/27/18 1112    Visit Number  5    Number of Visits  24    Date for OT Re-Evaluation  03/07/19    OT Start Time  1105    OT Stop Time  1145    OT Time Calculation (min)  40 min    Activity Tolerance  Patient tolerated treatment well    Behavior During Therapy  [facility] for tasks assessed/performed       Past Medical History:  Diagnosis Date  . Acanthosis nigricans   . CAD (coronary artery disease)   . Cardiomegaly   . Diabetes mellitus without complication ([place])   . History of adenomatous polyp of colon   . Hyperlipidemia   . Hypertension   . Knee pain   . Myocardial infarct [facility])     Past Surgical History:  Procedure Laterality Date  . CARDIAC CATHETERIZATION     Stent Placement x 5 to RCA   . COLONOSCOPY WITH PROPOFOL N/A 08/06/2016   Procedure: COLONOSCOPY WITH PROPOFOL;  Surgeon: Manya Silvas, MD;  Location: [facility] ENDOSCOPY;  Service: Endoscopy;  Laterality: N/A;  . CYSTECTOMY  1970&1990   x2 on tail bone    There were no vitals filed for this visit.  Subjective Assessment - 12/27/18 1112    Subjective   Pt. reports doing well today    Patient is accompanied by:  Family member    Pertinent History  Pt. is a 68 y.o. male who was diagnosed with a CVA with Left sided weakness. Pt. resides with his wife, and was previously independent with ADLs, and IADLs prior to onset of the CVA. 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 Pt. enjoying riding motorcycles with his wife.    Currently in Pain?  No/denies      OT TREATMENT    Neuro muscular re-education:  Pt. performed [facility] tasks using the Grooved  pegboard. Pt. worked on grasping the grooved pegs from a horizontal position, and moving the pegs to a vertical position in the hand to prepare for placing them in the grooved slot. Pt. worked on removing them by alternating thumb opposition to the tip of his 2nd through 4th digits. Pt. worked on grasping, and positioning 1" washers onto a magnetic hooks placed on a whiteboard positioned at a vertical angle on the tabletop. Pt. worked on [facility] skills grasping 1/2" flat washers, and placing them on the hooks.  Pt. presented with increased leaning to the left.  Therapeutic Exercise:  Pt. worked on pinch strengthening in the left hand for lateral, and 3pt. pinch using yellow, red, green, and blue resistive clips. Pt. worked on placing the clips on a horizontal dowel. Pt. worked on grasping the clips from the table, and using his left hand to reposition it in his hand.                               OT Long Term Goals - 12/06/18 1748      OT LONG TERM GOAL #1   Title  Pt. will improve grip strength by 10# to be  able to sustain a firm hold on a motorcycle clutch.    Baseline  Eval: Pt. is unable    Time  12    Period  Weeks    Status  New    Target Date  03/07/19      OT LONG TERM GOAL #2   Title  Pt. will improve lateral pinch strength by 3# to be able to hold a plate steady.    Baseline  Eval: Pt. has difficulty hold a plate steady, and carrying it.    Time  12    Period  Weeks    Status  New    Target Date  03/07/19      OT LONG TERM GOAL #3   Title  Pt. will independently hike the left side of his pants.    Baseline  Eval: Pt. is unable    Time  12    Period  Weeks    Status  New    Target Date  03/07/19      OT LONG TERM GOAL #4   Title  Pt. will independently wash his RUE using his LUE.    Baseline  Eval: Pt. has difficulty    Time  12    Period  Weeks    Status  On-going    Target Date  03/07/19      OT LONG TERM GOAL #5   Title  Pt. will improvie  left hand [facility] skills  to be able to tie his shoes independently    Baseline  Eval: Pt. is unable    Time  12    Period  Weeks    Status  New    Target Date  03/07/19            Plan - 12/27/18 1113    Clinical Impression Statement Pt. continues to present with limited LUE strength, motor control, and [facility] skills. Pt. Is using his hand more  at home now. Pt. is now able to pick up coins with his left hand, and place them into his bank. Pt. has improved with thumb opposition. Pt. continues to work on improving LUE functioning, while limiting compensation proximally, and through the trunk in order to improve LUE engagement during ADLs, and IADL tasks.    OT Occupational Profile and History  Detailed Assessment- Review of Records and additional review of physical, cognitive, psychosocial history related to current functional performance    Occupational performance deficits (Please refer to evaluation for details):  ADL's;IADL's    Body Structure / Function / Physical Skills  ADL;IADL;FMC;ROM;UE functional use;Strength    Clinical Decision Making  Several treatment options, min-mod task modification necessary    Comorbidities Affecting Occupational Performance:  May have comorbidities impacting occupational performance    Modification or Assistance to Complete Evaluation   Min-Moderate modification of tasks or assist with assess necessary to complete eval    OT Frequency  2x / week    OT Duration  12 weeks    OT Treatment/Interventions  Self-care/ADL training;Therapeutic activities;Therapeutic exercise;Patient/family education;DME and/or AE instruction;Neuromuscular education    Consulted and Agree with Plan of Care  Patient;Family member/caregiver       Patient will benefit from skilled therapeutic intervention in order to improve the following deficits and impairments:   Body Structure / Function / Physical Skills: ADL, IADL, FMC, ROM, UE functional use, Strength       Visit  Diagnosis: Muscle weakness (generalized)  Other lack of coordination  Problem List Patient Active Problem List   Diagnosis Date Noted  . CVA (cerebrovascular accident) ([place]) 11/17/2018  . Morbid obesity ([place]) 02/07/2015  . Knee pain 02/06/2015  . History of adenomatous polyp of colon 02/06/2015  . Acanthosis nigricans 02/06/2015  . Hyperlipidemia, mixed 01/12/2011  . 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 Pure hypercholesterolemia 03/13/2007  . Essential hypertension 03/13/2007  . Personal history of tobacco use 02/08/2007  . Cardiac enlargement 10/18/2006  . Myocardial infarct, old 09/18/2006  . Controlled diabetes mellitus with renal manifestations ([place]) 09/18/2006  . CAD (coronary artery disease) 09/18/2006    Harrel Carina, MS, OTR/L 12/27/2018, 11:50 AM  [place] MAIN [facility] SERVICES [address] [place], [place], [address] Phone: [phone]   Fax:  [phone]  Name: Todd Potter MRN: [id number] Date of Birth: 1951-03-31

## 2019-01-01 ENCOUNTER — Ambulatory Visit: Payer: Medicare Other | Admitting: Occupational Therapy

## 2019-01-01 ENCOUNTER — Other Ambulatory Visit: Payer: Self-pay

## 2019-01-01 ENCOUNTER — Encounter: Payer: Self-pay | Admitting: Occupational Therapy

## 2019-01-01 ENCOUNTER — Ambulatory Visit: Payer: Medicare Other

## 2019-01-01 DIAGNOSIS — M6281 Muscle weakness (generalized): Secondary | ICD-10-CM

## 2019-01-01 DIAGNOSIS — R278 Other lack of coordination: Secondary | ICD-10-CM | POA: Diagnosis not present

## 2019-01-01 DIAGNOSIS — R2681 Unsteadiness on feet: Secondary | ICD-10-CM | POA: Diagnosis not present

## 2019-01-01 DIAGNOSIS — R269 Unspecified abnormalities of gait and mobility: Secondary | ICD-10-CM

## 2019-01-01 DIAGNOSIS — R531 Weakness: Secondary | ICD-10-CM | POA: Diagnosis not present

## 2019-01-01 NOTE — Therapy (Signed)
[place] MAIN [facility] SERVICES [address] [place], [place], [address] Phone: [phone]   Fax:  [phone]  Physical Therapy Treatment  Patient Details  Name: Todd Potter MRN: [id number] Date of Birth: 03-30-1951 Referring Provider (PT): [name] MD   Encounter Date: 01/01/2019  PT End of Session - 01/01/19 1253    Visit Number  8    Number of Visits  25    Date for PT Re-Evaluation  02/21/19    Authorization Type  initial eval: 11/29/18    PT Start Time  1300    PT Stop Time  1345    PT Time Calculation (min)  45 min    Equipment Utilized During Treatment  Gait belt    Activity Tolerance  Patient tolerated treatment well    Behavior During Therapy  [facility] for tasks assessed/performed       Past Medical History:  Diagnosis Date  . Acanthosis nigricans   . CAD (coronary artery disease)   . Cardiomegaly   . Diabetes mellitus without complication ([place])   . History of adenomatous polyp of colon   . Hyperlipidemia   . Hypertension   . Knee pain   . Myocardial infarct [facility])     Past Surgical History:  Procedure Laterality Date  . CARDIAC CATHETERIZATION     Stent Placement x 5 to RCA   . COLONOSCOPY WITH PROPOFOL N/A 08/06/2016   Procedure: COLONOSCOPY WITH PROPOFOL;  Surgeon: Manya Silvas, MD;  Location: [facility] ENDOSCOPY;  Service: Endoscopy;  Laterality: N/A;  . CYSTECTOMY  1970&1990   x2 on tail bone    There were no vitals filed for this visit.  Subjective Assessment - 01/01/19 1306    Subjective  Patient reports doing well today.  He endorses no pain or soreness today.  No new questions or concerns at this time.    Patient is accompained by:  Family member    Pertinent History  Pt reports getting up in the morning on 11/16/18 and upon waking noticed not having any strength in his left hand and his wife encouraged him to go to the hospital. He did not experience and numbness or tingling just weakness in LUE. He did  not notice any changes in strength or sensation LLE. The following day 11/17/18 he began to notice some toe drag of LLE and weakness. He called his doctor who encouraged him to go to the hospital in which he did. 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    Limitations  Walking    Patient Stated Goals  riding his motorcycle, fishing without difficulty, walking without difficulty    Currently in Pain?  No/denies    Pain Onset  More than a month ago         TREATMENT:  There-ex:  Warm up on Nustep BUE/BLE level 2 for 2 min and level 4 for 2 min  (with history intake, 2 min Unbilled);   Leg press: LLE only 75# x17, 90# x12 displaying good carryover with slow and controlled motion and avoiding genu recurvatum.  Standing with back to wall: Terminal knee extension against small ball 3 sec hold 2x15 reps with min VCs initially for proper positioning;  TKEs with green tband around L knee; cueing for correct technique and a slow and controlled motion.   Standing with green tband around BLE, performed bilaterally: hip abduction 2x15 reps, RLE hip extension 2x15 reps, hip flexion 2x15 reps with mod  VCs for to keep toes facing forward and to perform in a slow and controlled fashion.   STSs without UE support from regular height chair with dynadisc under RLE x10; CGA throughout   Gait training: Patient ambulates with reciprocal gait pattern, no AD, with knee hyperextension on LLE during terminal stance, and decreased ankle DF at heel strike with fatigue; Instructed patient to increase LLE knee extension at terminal swing/initial contact to help prevent knee hyperextension;               Pt educated throughout session about proper posture and technique with exercises. Improved exercise technique, movement at target joints, use of target muscles after min to mod verbal, visual, tactile cues.    Response to treatment:  Pt continues to display L knee hyperextension during gait, but  displays an equal step length today;  He demonstrates improvement with standing TKEs with the green tband today, with better control throughout. He denies any increase in pain at end of session;                           PT Short Term Goals - 11/29/18 1241      PT SHORT TERM GOAL #1   Title  Pt will be independent with HEP in order to improve strength and balance in order to decrease fall risk and improve function at home and work.    Time  6    Period  Weeks    Status  New    Target Date  01/10/19        PT Long Term Goals - 11/29/18 1241      PT LONG TERM GOAL #1   Title  Pt will improve BERG by at least 3 points in order to demonstrate clinically significant improvement in balance.    Baseline  11/29/18: 50/56    Time  12    Period  Weeks    Status  New    Target Date  02/21/19      PT LONG TERM GOAL #2   Title  Pt will decrease 5TSTS by at least 3 seconds in order to demonstrate clinically significant improvement in LE strength.    Baseline  11/29/18: 13.66s    Time  12    Period  Weeks    Status  New    Target Date  02/21/19      PT LONG TERM GOAL #3   Title  Pt will decrease TUG to below 14 seconds/decrease in order to demonstrate decreased fall risk.    Baseline  11/29/18: 14.96s    Time  12    Period  Weeks    Status  New    Target Date  02/21/19      PT LONG TERM GOAL #4   Title  Patient will improve walking speed to >1.0 m/s in order to increase safety for community ambulation.    Baseline  11/29/18: Self-selected: 16.5s =  0.61 m/s; Fastest: 11.4s = 0.88 m/s    Time  12    Period  Weeks    Status  New            Plan - 01/01/19 1359    Clinical Impression Statement  Patient demonstrates good motivation during therapy. He continues to display L knee hyperextension during gait, but displays an equal step length today;  He demonstrates improvement with standing TKEs with the green tband today, with better control throughout. He  denies any increase in pain at end of session.  He would benefit from additional skilled PT intervention to improve strength, balance, and gait safety.    Personal Factors and Comorbidities  Comorbidity 3+;Age    Comorbidities  CAD, HTN, DM, hyperlipidemia    Examination-Activity Limitations  Squat;Locomotion Level;Stand    Examination-Participation Restrictions  Driving;Yard Work;Other   Leisure activities/fishing   Stability/Clinical Decision Making  Evolving/Moderate complexity    Rehab Potential  Good    PT Frequency  2x / week    PT Duration  12 weeks    PT Treatment/Interventions  ADLs/Self Care Home Management;Aquatic Therapy;Biofeedback;Canalith Repostioning;Cryotherapy;Electrical Stimulation;Ultrasound;Moist Heat;Iontophoresis 4mg /ml Dexamethasone;Gait training;Stair training;Functional mobility training;Therapeutic activities;Therapeutic exercise;Balance training;Patient/family education;Neuromuscular re-education;Manual techniques;Joint Manipulations;Spinal Manipulations;Vestibular;Traction;DME Instruction;Cognitive remediation;Orthotic Fit/Training;Passive range of motion;Dry needling;Splinting    PT Next Visit Plan  Continue strengthening and balance exercises, utilize NMES for L ankle strength as appropriate    PT Home Exercise Plan  Access Code: [email or web address]    Consulted and Agree with Plan of Care  Patient       Patient will benefit from skilled therapeutic intervention in order to improve the following deficits and impairments:  Abnormal gait, Decreased coordination, Difficulty walking, Impaired UE functional use, Decreased balance, Decreased mobility, Decreased strength  Visit Diagnosis: Muscle weakness (generalized)  Unsteadiness on feet  Abnormal gait     Problem List Patient Active Problem List   Diagnosis Date Noted  . CVA (cerebrovascular accident) ([place]) 11/17/2018  . Morbid obesity ([place]) 02/07/2015  . Knee pain 02/06/2015  . History of adenomatous polyp of colon  02/06/2015  . Acanthosis nigricans 02/06/2015  . Hyperlipidemia, mixed 01/12/2011  . 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 Pure hypercholesterolemia 03/13/2007  . Essential hypertension 03/13/2007  . Personal history of tobacco use 02/08/2007  . Cardiac enlargement 10/18/2006  . Myocardial infarct, old 09/18/2006  . Controlled diabetes mellitus with renal manifestations ([place]) 09/18/2006  . CAD (coronary artery disease) 09/18/2006    This entire session was performed under direct supervision and direction of a licensed therapist/therapist assistant . I have personally read, edited and approve of the note as written.   Lutricia Horsfall, SPT Phillips Grout PT, DPT, GCS  Huprich,Jason 01/01/2019, 5:27 PM  [place] MAIN [facility] SERVICES [address] [place], [place], [address] Phone: [phone]   Fax:  [phone]  Name: IOSIF SURACE MRN: [id number] Date of Birth: 02/25/1951

## 2019-01-01 NOTE — Therapy (Signed)
[place] MAIN [facility] SERVICES [address] [place], [place], [address] Phone: [phone]   Fax:  [phone]  Occupational Therapy Treatment  Patient Details  Name: Todd Potter MRN: [id number] Date of Birth: 11/03/1950 Referring Provider (OT): Dr. Caryn Section   Encounter Date: 01/01/2019  OT End of Session - 01/01/19 1156    Visit Number  6    Number of Visits  24    Date for OT Re-Evaluation  03/07/19    OT Start Time  1150    OT Stop Time  1230    OT Time Calculation (min)  40 min    Activity Tolerance  Patient tolerated treatment well    Behavior During Therapy  [facility] for tasks assessed/performed       Past Medical History:  Diagnosis Date  . Acanthosis nigricans   . CAD (coronary artery disease)   . Cardiomegaly   . Diabetes mellitus without complication ([place])   . History of adenomatous polyp of colon   . Hyperlipidemia   . Hypertension   . Knee pain   . Myocardial infarct [facility])     Past Surgical History:  Procedure Laterality Date  . CARDIAC CATHETERIZATION     Stent Placement x 5 to RCA   . COLONOSCOPY WITH PROPOFOL N/A 08/06/2016   Procedure: COLONOSCOPY WITH PROPOFOL;  Surgeon: Manya Silvas, MD;  Location: [facility] ENDOSCOPY;  Service: Endoscopy;  Laterality: N/A;  . CYSTECTOMY  1970&1990   x2 on tail bone    There were no vitals filed for this visit.  Subjective Assessment - 01/01/19 1155    Subjective   Pt. reports feeling good today    Patient is accompanied by:  Family member    Pertinent History  Pt. is a 68 y.o. male who was diagnosed with a CVA with Left sided weakness. Pt. resides with his wife, and was previously independent with ADLs, and IADLs prior to onset of the CVA. 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 Pt. enjoying riding motorcycles with his wife.    Limitations  LUE weakness, decreased motor control, and [facility] skills.    Currently in Pain?  No/denies      OT TREATMENT    Neuro  muscular re-education:  Pt. worked with his left hand grasping, and manipulating 1/2" small pegs focusing on grasping the pegs, storing them in the palm of his hand, and translatory skills moving them through his hand from the palm to the tip of his 2nd digit, and thumb to prepare for placing them into the pegboard. Pt. presented with compensation proximally with leaning to the left through his trunk with several pegs dropping from his hand during translatory movements. Pt. worked on grasping, and manipulating 1/2" washers from a magnetic dish using Potter grasp pattern. Pt. worked on reaching up, stabilizing, and sustaining shoulder elevation while placing the washer over a small precise target on vertical dowels positioned at various angles. No washers were dropped from the palm of his hand. Pt. worked on bilateral hand [facility] skills untying knots on a rope of medium thickness.  Therapeutic Exercise:  Pt. performed gross gripping with grip strengthener. Pt. worked on sustaining grip while grasping pegs and reaching at various heights with his LUE. Pt. worked on gripping with 17.9# of grip strength force.  OT Long Term Goals - 12/06/18 1748      OT LONG TERM GOAL #1   Title  Pt. will improve grip strength by 10# to be able to sustain a firm hold on a motorcycle clutch.    Baseline  Eval: Pt. is unable    Time  12    Period  Weeks    Status  New    Target Date  03/07/19      OT LONG TERM GOAL #2   Title  Pt. will improve lateral pinch strength by 3# to be able to hold a plate steady.    Baseline  Eval: Pt. has difficulty hold a plate steady, and carrying it.    Time  12    Period  Weeks    Status  New    Target Date  03/07/19      OT LONG TERM GOAL #3   Title  Pt. will independently hike the left side of his pants.    Baseline  Eval: Pt. is unable    Time  12    Period  Weeks    Status  New    Target Date  03/07/19      OT LONG TERM  GOAL #4   Title  Pt. will independently wash his RUE using his LUE.    Baseline  Eval: Pt. has difficulty    Time  12    Period  Weeks    Status  On-going    Target Date  03/07/19      OT LONG TERM GOAL #5   Title  Pt. will improvie left hand [facility] skills  to be able to tie his shoes independently    Baseline  Eval: Pt. is unable    Time  12    Period  Weeks    Status  New    Target Date  03/07/19            Plan - 01/01/19 1157    Clinical Impression Statement Pt. continues to present with limited LUE strength, motor control, and [facility] skills. Pt. is improving with left hand function skills, and using his right hand to manipulate small objects duirng ADL, And IADL tasks at home. Pt. continues to work on inproving grip strength, pinch strength, motor control, and [facility] skills in order to improve his ability to handle items, and perfom translatory movements during ADLs, and IADLs.    OT Occupational Profile and History  Detailed Assessment- Review of Records and additional review of physical, cognitive, psychosocial history related to current functional performance    Occupational performance deficits (Please refer to evaluation for details):  ADL's;IADL's    Body Structure / Function / Physical Skills  ADL;IADL;FMC;ROM;UE functional use;Strength    Clinical Decision Making  Several treatment options, min-mod task modification necessary    Comorbidities Affecting Occupational Performance:  May have comorbidities impacting occupational performance    Modification or Assistance to Complete Evaluation   Min-Moderate modification of tasks or assist with assess necessary to complete eval    OT Frequency  2x / week    OT Duration  12 weeks    OT Treatment/Interventions  Self-care/ADL training;Therapeutic activities;Therapeutic exercise;Patient/family education;DME and/or AE instruction;Neuromuscular education    Consulted and Agree with Plan of Care  Patient;Family member/caregiver    Family  Member Consulted  Wife       Patient will benefit from skilled therapeutic intervention in order to improve the following deficits and impairments:   Body  Structure / Function / Physical Skills: ADL, IADL, FMC, ROM, UE functional use, Strength       Visit Diagnosis: Muscle weakness (generalized)  Other lack of coordination    Problem List Patient Active Problem List   Diagnosis Date Noted  . CVA (cerebrovascular accident) ([place]) 11/17/2018  . Morbid obesity ([place]) 02/07/2015  . Knee pain 02/06/2015  . History of adenomatous polyp of colon 02/06/2015  . Acanthosis nigricans 02/06/2015  . Hyperlipidemia, mixed 01/12/2011  . 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 Pure hypercholesterolemia 03/13/2007  . Essential hypertension 03/13/2007  . Personal history of tobacco use 02/08/2007  . Cardiac enlargement 10/18/2006  . Myocardial infarct, old 09/18/2006  . Controlled diabetes mellitus with renal manifestations ([place]) 09/18/2006  . CAD (coronary artery disease) 09/18/2006    Harrel Carina, MS, OTR/L 01/01/2019, 12:09 PM  [place] MAIN [facility] SERVICES [address] [place], [place], [address] Phone: [phone]   Fax:  [phone]  Name: Todd Potter MRN: [id number] Date of Birth: Nov 14, 1950

## 2019-01-03 ENCOUNTER — Encounter: Payer: Self-pay | Admitting: Physical Therapy

## 2019-01-03 ENCOUNTER — Ambulatory Visit: Payer: Medicare Other | Admitting: Physical Therapy

## 2019-01-03 ENCOUNTER — Ambulatory Visit: Payer: Medicare Other | Admitting: Occupational Therapy

## 2019-01-03 ENCOUNTER — Encounter: Payer: Self-pay | Admitting: Occupational Therapy

## 2019-01-03 ENCOUNTER — Other Ambulatory Visit: Payer: Self-pay

## 2019-01-03 DIAGNOSIS — M6281 Muscle weakness (generalized): Secondary | ICD-10-CM | POA: Diagnosis not present

## 2019-01-03 DIAGNOSIS — R269 Unspecified abnormalities of gait and mobility: Secondary | ICD-10-CM

## 2019-01-03 DIAGNOSIS — R278 Other lack of coordination: Secondary | ICD-10-CM

## 2019-01-03 DIAGNOSIS — R2681 Unsteadiness on feet: Secondary | ICD-10-CM

## 2019-01-03 DIAGNOSIS — R531 Weakness: Secondary | ICD-10-CM | POA: Diagnosis not present

## 2019-01-03 NOTE — Therapy (Signed)
[place] MAIN [facility] SERVICES [address] [place], [place], [address] Phone: [phone]   Fax:  [phone]  Occupational Therapy Treatment  Patient Details  Name: Todd Potter MRN: [id number] Date of Birth: 04/08/1951 Referring Provider (OT): Dr. Caryn Section   Encounter Date: 01/03/2019  OT End of Session - 01/03/19 1109    Visit Number  7    Number of Visits  24    Date for OT Re-Evaluation  03/07/19    OT Start Time  1104    OT Stop Time  1145    OT Time Calculation (min)  41 min    Activity Tolerance  Patient tolerated treatment well    Behavior During Therapy  [facility] for tasks assessed/performed       Past Medical History:  Diagnosis Date  . Acanthosis nigricans   . CAD (coronary artery disease)   . Cardiomegaly   . Diabetes mellitus without complication ([place])   . History of adenomatous polyp of colon   . Hyperlipidemia   . Hypertension   . Knee pain   . Myocardial infarct [facility])     Past Surgical History:  Procedure Laterality Date  . CARDIAC CATHETERIZATION     Stent Placement x 5 to RCA   . COLONOSCOPY WITH PROPOFOL N/A 08/06/2016   Procedure: COLONOSCOPY WITH PROPOFOL;  Surgeon: Manya Silvas, MD;  Location: [facility] ENDOSCOPY;  Service: Endoscopy;  Laterality: N/A;  . CYSTECTOMY  1970&1990   x2 on tail bone    There were no vitals filed for this visit.  Subjective Assessment - 01/03/19 1108    Subjective   Pt. reports feeling good today    Patient is accompanied by:  Family member    Pertinent History  Pt. is a 68 y.o. male who was diagnosed with a CVA with Left sided weakness. Pt. resides with his wife, and was previously independent with ADLs, and IADLs prior to onset of the CVA. 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 Pt. enjoying riding motorcycles with his wife.    Currently in Pain?  No/denies      OT TREATMENT    Neuro muscular re-education:  Pt. performed [facility] skills training to improve  speed and dexterity needed for ADL tasks and writing. Pt. demonstrated grasping 1 inch sticks,  inch cylindrical collars, and  inch flat washers on the [place] pegboard. Pt. worked on Printmaker with his 2nd digit and thumb, worked on storing them in the palm, and translatory movements of the hand moving the objects through his hand. Pt. dropped several objects when moving the sticks, collars, and washers through his left hand.  Pt. presents with compensation proximally with hiking his left shoulder, and leaning tot he right with his trunk. Pt. Worked on bilateral alternating hand movements to remove the pegs from the pegboard. Pt. Worked on grasping marbles with his left hand, and worked on storing the marbles, and translatory movements moving them through his left hand.  Therapeutic Exercise:  Pt. performed gross gripping using his left hand with grip strengthener. Pt. worked on sustaining grip while grasping pegs and reaching at various heights. The gripper was set at 23.4# of grip strength force.   Response to Treatment:   Pt. is making progress overall with LUE functioning. Pt. continues to work on improving translatory movements of the left hand as pt. drops multiple small objects when moving them through his han. Pt. requires cues to avoid compensating  proximally with hiking the left shoulder, and leaning slightly to the right with his trunk when perfroming [facility] skills with the left hand.                              OT Long Term Goals - 12/06/18 1748      OT LONG TERM GOAL #1   Title  Pt. will improve grip strength by 10# to be able to sustain a firm hold on a motorcycle clutch.    Baseline  Eval: Pt. is unable    Time  12    Period  Weeks    Status  New    Target Date  03/07/19      OT LONG TERM GOAL #2   Title  Pt. will improve lateral pinch strength by 3# to be able to hold a plate steady.    Baseline  Eval: Pt. has difficulty hold a plate steady,  and carrying it.    Time  12    Period  Weeks    Status  New    Target Date  03/07/19      OT LONG TERM GOAL #3   Title  Pt. will independently hike the left side of his pants.    Baseline  Eval: Pt. is unable    Time  12    Period  Weeks    Status  New    Target Date  03/07/19      OT LONG TERM GOAL #4   Title  Pt. will independently wash his RUE using his LUE.    Baseline  Eval: Pt. has difficulty    Time  12    Period  Weeks    Status  On-going    Target Date  03/07/19      OT LONG TERM GOAL #5   Title  Pt. will improvie left hand [facility] skills  to be able to tie his shoes independently    Baseline  Eval: Pt. is unable    Time  12    Period  Weeks    Status  New    Target Date  03/07/19            Plan - 01/03/19 1110    Clinical Impression Statement  Pt. reports that he got onto his motorcycle this weekend, and practiced using, and holding the clutch with his left hand. Pt. reports that he didi not ride it. Pt. reports that he was able to hold the clutch. Pt. enjoys working on motorcycles, and is planning to replace the handle bars on his bike working with his left hand. Pt. is making progress overall with LUE functioning. Pt. continues to work on improving translatory movements of the left hand as pt. drops multiple small objects when moving them through his han. Pt. requires cues to avoid compensating proximally with hiking the left shoulder, and leaning slightly to the right with his trunk when perfroming [facility] skills with the left hand.    OT Occupational Profile and History  Detailed Assessment- Review of Records and additional review of physical, cognitive, psychosocial history related to current functional performance    Occupational performance deficits (Please refer to evaluation for details):  ADL's;IADL's    Body Structure / Function / Physical Skills  ADL;IADL;FMC;ROM;UE functional use;Strength    Clinical Decision Making  Several treatment options, min-mod task  modification necessary    Comorbidities Affecting Occupational Performance:  May have  comorbidities impacting occupational performance    Modification or Assistance to Complete Evaluation   Min-Moderate modification of tasks or assist with assess necessary to complete eval    OT Frequency  2x / week    OT Duration  12 weeks    OT Treatment/Interventions  Self-care/ADL training;Therapeutic activities;Therapeutic exercise;Patient/family education;DME and/or AE instruction;Neuromuscular education    Consulted and Agree with Plan of Care  Patient;Family member/caregiver       Patient will benefit from skilled therapeutic intervention in order to improve the following deficits and impairments:   Body Structure / Function / Physical Skills: ADL, IADL, FMC, ROM, UE functional use, Strength       Visit Diagnosis: Muscle weakness (generalized)  Other lack of coordination    Problem List Patient Active Problem List   Diagnosis Date Noted  . CVA (cerebrovascular accident) ([place]) 11/17/2018  . Morbid obesity ([place]) 02/07/2015  . Knee pain 02/06/2015  . History of adenomatous polyp of colon 02/06/2015  . Acanthosis nigricans 02/06/2015  . Hyperlipidemia, mixed 01/12/2011  . 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 Pure hypercholesterolemia 03/13/2007  . Essential hypertension 03/13/2007  . Personal history of tobacco use 02/08/2007  . Cardiac enlargement 10/18/2006  . Myocardial infarct, old 09/18/2006  . Controlled diabetes mellitus with renal manifestations ([place]) 09/18/2006  . CAD (coronary artery disease) 09/18/2006    Harrel Carina, MS, OTR/L 01/03/2019, 11:30 AM  [place] MAIN [facility] SERVICES [address] [place], [place], [address] Phone: [phone]   Fax:  [phone]  Name: Todd Potter MRN: [id number] Date of Birth: [birth year]

## 2019-01-03 NOTE — Therapy (Signed)
[place] MAIN [facility] SERVICES [address] [place], [place], [address] Phone: [phone]   Fax:  [phone]  Physical Therapy Treatment  Patient Details  Name: Todd Potter MRN: [id number] Date of Birth: 01-15-1951 Referring Provider (PT): [name] MD   Encounter Date: 01/03/2019  PT End of Session - 01/03/19 1140    Visit Number  9    Number of Visits  25    Date for PT Re-Evaluation  02/21/19    Authorization Type  initial eval: 11/29/18    PT Start Time  1152    PT Stop Time  1230    PT Time Calculation (min)  38 min    Equipment Utilized During Treatment  Gait belt    Activity Tolerance  Patient tolerated treatment well    Behavior During Therapy  [facility] for tasks assessed/performed       Past Medical History:  Diagnosis Date  . Acanthosis nigricans   . CAD (coronary artery disease)   . Cardiomegaly   . Diabetes mellitus without complication ([place])   . History of adenomatous polyp of colon   . Hyperlipidemia   . Hypertension   . Knee pain   . Myocardial infarct [facility])     Past Surgical History:  Procedure Laterality Date  . CARDIAC CATHETERIZATION     Stent Placement x 5 to RCA   . COLONOSCOPY WITH PROPOFOL N/A 08/06/2016   Procedure: COLONOSCOPY WITH PROPOFOL;  Surgeon: Manya Silvas, MD;  Location: [facility] ENDOSCOPY;  Service: Endoscopy;  Laterality: N/A;  . CYSTECTOMY  1970&1990   x2 on tail bone    There were no vitals filed for this visit.  Subjective Assessment - 01/03/19 1150    Subjective  Patient reports not doing HEP yesterday but rather worked in the yard; Reports doing well; no new complaints.    Pertinent History  Pt reports getting up in the morning on 11/16/18 and upon waking noticed not having any strength in his left hand and his wife encouraged him to go to the hospital. He did not experience and numbness or tingling just weakness in LUE. He did not notice any changes in strength or sensation LLE. The  following day 11/17/18 he began to notice some toe drag of LLE and weakness. He called his doctor who encouraged him to go to the hospital in which he did. 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    Limitations  Walking    Patient Stated Goals  riding his motorcycle, fishing without difficulty, walking without difficulty    Currently in Pain?  No/denies    Pain Score  0-No pain    Multiple Pain Sites  No         TREATMENT:  There-ex:  Warm up on Nustep BUE/BLE level 4 for 5 min (with history intake, 2 min Unbilled);   Leg press: LLE only 75# x15, 90# x15 displaying good carryover with slow and controlled motion and avoiding genu recurvatum.  Standing with back to wall: Terminal knee extension against small ball 3 sec hold 2x10 reps with min VCs initially for proper positioning;  TKEs with green tband around L knee 3 sec hold x10 reps; cueing for correct technique and a slow and controlled motion.    STSs without UE support from low mat table, RLE ahead of LLE (semi-tandem) x10 reps with cues for positioning and forward weight shift for better transfer ability; Patient able to tolerate increased weight  bearing in LLE well;  Progressed to sit to stand with RLE extended out straight to facilitate increased weight bearing in LLE, patient required increased push through BUE initially but then was able to use momentum to improve transfer ability x10 reps; CGA required for safety;   Seated LAQ LLE 2# x15 with cues to keep ankle DF during ROM to facilitate improved knee control during gait tasks.  Progressed to LAQ with 3# x15, LLE only with cues to slow down LE movement and increase ankle DF for better motor control and strengthening;    Gait training: Standing in hallway Taking one step forward with heel strike and weight shift forward with knee straight and then stepping back together x5 reps each foot in front; requires CGA for safety and balance and cues for  improved DF at heel strike, increase step length and improve weight acceptance for better motor planning with gait;  Patient ambulates with reciprocal gait pattern, no AD, with knee hyperextension on LLE during initial stance, and decreased ankle DF at heel strike with fatigue; He also exhibits increased LLE knee instability during mid/terminal stance with increased flexion during weight acceptance. He does exhibit improved LLE knee control this session compared to a few weeks ago.              Pt educated throughout session about proper posture and technique with exercises. Improved exercise technique, movement at target joints, use of target muscles after min to mod verbal, visual, tactile cues.    Response to treatment:  Pt continues to display L knee hyperextension during gait, but displays an equal step length today;  He demonstrates improvement with standing TKEs with the green tband today, with better control throughout. He denies any increase in pain at end of session;                        PT Education - 01/03/19 1214    Education Details  exercise technique/HEP    Person(s) Educated  Patient    Methods  Explanation;Verbal cues    Comprehension  Verbalized understanding;Returned demonstration;Verbal cues required;Need further instruction       PT Short Term Goals - 11/29/18 1241      PT SHORT TERM GOAL #1   Title  Pt will be independent with HEP in order to improve strength and balance in order to decrease fall risk and improve function at home and work.    Time  6    Period  Weeks    Status  New    Target Date  01/10/19        PT Long Term Goals - 11/29/18 1241      PT LONG TERM GOAL #1   Title  Pt will improve BERG by at least 3 points in order to demonstrate clinically significant improvement in balance.    Baseline  11/29/18: 50/56    Time  12    Period  Weeks    Status  New    Target Date  02/21/19      PT LONG TERM GOAL #2   Title  Pt will  decrease 5TSTS by at least 3 seconds in order to demonstrate clinically significant improvement in LE strength.    Baseline  11/29/18: 13.66s    Time  12    Period  Weeks    Status  New    Target Date  02/21/19      PT LONG TERM GOAL #3  Title  Pt will decrease TUG to below 14 seconds/decrease in order to demonstrate decreased fall risk.    Baseline  11/29/18: 14.96s    Time  12    Period  Weeks    Status  New    Target Date  02/21/19      PT LONG TERM GOAL #4   Title  Patient will improve walking speed to >1.0 m/s in order to increase safety for community ambulation.    Baseline  11/29/18: Self-selected: 16.5s =  0.61 m/s; Fastest: 11.4s = 0.88 m/s    Time  12    Period  Weeks    Status  New            Plan - 01/03/19 1144    Clinical Impression Statement  Patient presents to therapy with good motivation; he exhibits improved LLE knee control this session with less genu recuvatum; he does exhibit increased left foot slap with continued weakness in LLE. Patient tolerated strengthening exercise well. he does require cues for proper positioning and exercise technique. He would benefit from additional skilled PT intervention to improve strength and mobility;    Personal Factors and Comorbidities  Comorbidity 3+;Age    Comorbidities  CAD, HTN, DM, hyperlipidemia    Examination-Activity Limitations  Squat;Locomotion Level;Stand    Examination-Participation Restrictions  Driving;Yard Work;Other   Leisure activities/fishing   Stability/Clinical Decision Making  Evolving/Moderate complexity    Rehab Potential  Good    PT Frequency  2x / week    PT Duration  12 weeks    PT Treatment/Interventions  ADLs/Self Care Home Management;Aquatic Therapy;Biofeedback;Canalith Repostioning;Cryotherapy;Electrical Stimulation;Ultrasound;Moist Heat;Iontophoresis 4mg /ml Dexamethasone;Gait training;Stair training;Functional mobility training;Therapeutic activities;Therapeutic exercise;Balance  training;Patient/family education;Neuromuscular re-education;Manual techniques;Joint Manipulations;Spinal Manipulations;Vestibular;Traction;DME Instruction;Cognitive remediation;Orthotic Fit/Training;Passive range of motion;Dry needling;Splinting    PT Next Visit Plan  Continue strengthening and balance exercises, utilize NMES for L ankle strength as appropriate    PT Home Exercise Plan  Access Code: [email or web address]    Consulted and Agree with Plan of Care  Patient       Patient will benefit from skilled therapeutic intervention in order to improve the following deficits and impairments:  Abnormal gait, Decreased coordination, Difficulty walking, Impaired UE functional use, Decreased balance, Decreased mobility, Decreased strength  Visit Diagnosis: Muscle weakness (generalized)  Unsteadiness on feet  Abnormal gait  Other lack of coordination     Problem List Patient Active Problem List   Diagnosis Date Noted  . CVA (cerebrovascular accident) ([place]) 11/17/2018  . Morbid obesity ([place]) 02/07/2015  . Knee pain 02/06/2015  . History of adenomatous polyp of colon 02/06/2015  . Acanthosis nigricans 02/06/2015  . Hyperlipidemia, mixed 01/12/2011  . 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 Pure hypercholesterolemia 03/13/2007  . Essential hypertension 03/13/2007  . Personal history of tobacco use 02/08/2007  . Cardiac enlargement 10/18/2006  . Myocardial infarct, old 09/18/2006  . Controlled diabetes mellitus with renal manifestations ([place]) 09/18/2006  . CAD (coronary artery disease) 09/18/2006    , PT, DPT 01/03/2019, 1:04 PM  [place] MAIN [facility] SERVICES [address] [place], [place], [address] Phone: [phone]   Fax:  [phone]  Name: Todd Potter MRN: [id number] Date of Birth: 07-Jul-1950

## 2019-01-08 ENCOUNTER — Encounter: Payer: Self-pay | Admitting: Occupational Therapy

## 2019-01-08 ENCOUNTER — Other Ambulatory Visit: Payer: Self-pay

## 2019-01-08 ENCOUNTER — Ambulatory Visit: Payer: Medicare Other | Admitting: Occupational Therapy

## 2019-01-08 ENCOUNTER — Ambulatory Visit: Payer: Medicare Other

## 2019-01-08 DIAGNOSIS — R531 Weakness: Secondary | ICD-10-CM

## 2019-01-08 DIAGNOSIS — R269 Unspecified abnormalities of gait and mobility: Secondary | ICD-10-CM | POA: Diagnosis not present

## 2019-01-08 DIAGNOSIS — M6281 Muscle weakness (generalized): Secondary | ICD-10-CM

## 2019-01-08 DIAGNOSIS — R278 Other lack of coordination: Secondary | ICD-10-CM

## 2019-01-08 DIAGNOSIS — R2681 Unsteadiness on feet: Secondary | ICD-10-CM | POA: Diagnosis not present

## 2019-01-08 NOTE — Therapy (Signed)
[place] MAIN [facility] SERVICES [place], [place], [address] Phone: [phone]   Fax:  [phone]    Physical Therapy Progress Note   Dates of reporting period  11/29/18   to   01/08/19 Physical Therapy Treatment  Patient Details  Name: Todd Potter MRN: [id number] Date of Birth: 1951/01/14 Referring Provider (PT): [name] MD   Encounter Date: 01/08/2019  PT End of Session - 01/08/19 1038    Visit Number  10    Number of Visits  25    Date for PT Re-Evaluation  02/21/19    Authorization Type  initial eval: 11/29/18    PT Start Time  1017    PT Stop Time  1057    PT Time Calculation (min)  40 min    Activity Tolerance  Patient tolerated treatment well;No increased pain    Behavior During Therapy  WFL for tasks assessed/performed       Past Medical History:  Diagnosis Date  . Acanthosis nigricans   . CAD (coronary artery disease)   . Cardiomegaly   . Diabetes mellitus without complication ([place])   . History of adenomatous polyp of colon   . Hyperlipidemia   . Hypertension   . Knee pain   . Myocardial infarct [facility])     Past Surgical History:  Procedure Laterality Date  . CARDIAC CATHETERIZATION     Stent Placement x 5 to RCA   . COLONOSCOPY WITH PROPOFOL N/A 08/06/2016   Procedure: COLONOSCOPY WITH PROPOFOL;  Surgeon: Manya Silvas, MD;  Location: [facility] ENDOSCOPY;  Service: Endoscopy;  Laterality: N/A;  . CYSTECTOMY  1970&1990   x2 on tail bone    There were no vitals filed for this visit.  Subjective Assessment - 01/08/19 1020    Subjective  Pt doing well this date in general. Pt reports he's beginning to notice some improvements at home which he attricutes to therapy. He is unable to give specific examples when asked.    Pertinent History  Pt reports getting up in the morning on 11/16/18 and upon waking noticed not having any strength in his left hand and his wife encouraged him to go to the hospital. He  did not experience and numbness or tingling just weakness in LUE. He did not notice any changes in strength or sensation LLE. The following day 11/17/18 he began to notice some toe drag of LLE and weakness. He called his doctor who encouraged him to go to the hospital in which he did. 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    Limitations  Walking    How long can you walk comfortably?  Pt walking his cauldesac for 30 minutes now, but he reports this is better than previously.    Patient Stated Goals  riding his motorcycle, fishing without difficulty, walking without difficulty    Currently in Pain?  No/denies       TREATMENT: 5xSTS: 11.47sec  TUG: 10.45sec 10MWT x2 1.76m/s   Leg press: LLE: 105lb 2x10; RLE 1x10 (reports effort near-equal)  Seated Left LAQ: 2x15 c 3lb weight Seated Left ankle DFlexion, 2x15 c 3lb weight  SLS 2x10x3sec bilat, both sides fairly limited in different ways (Right knee is 'bad') Tandem Stance rotation c weight; 16x rotation alternating sides with 2000g weighted ball.      PT Short Term Goals - 01/08/19 1023      PT SHORT TERM GOAL #1   Title  Pt  will be independent with HEP in order to improve strength and balance in order to decrease fall risk and improve function at home and work.    Time  6    Period  Weeks    Status  Achieved    Target Date  01/10/19        PT Long Term Goals - 01/08/19 1023      PT LONG TERM GOAL #1   Title  Pt will improve BERG by at least 3 points in order to demonstrate clinically significant improvement in balance.    Baseline  11/29/18: 50/56    Time  12    Period  Weeks    Status  On-going      PT LONG TERM GOAL #2   Title  Pt will decrease 5TSTS by at least 3 seconds in order to demonstrate clinically significant improvement in LE strength.    Baseline  11/29/18: 13.66s; 9/21 11.47sec    Time  12    Period  Weeks    Status  On-going    Target Date  02/21/19      PT LONG TERM GOAL #3    Title  Pt will decrease TUG to below 10 seconds/decrease in order to demonstrate decreased fall risk.    Baseline  11/29/18: 14.96s; on 01/08/19: 10.45s (no AD)    Time  12    Period  Weeks    Status  Revised    Target Date  02/21/19      PT LONG TERM GOAL #4   Title  Patient will improve walking speed to >1.0 m/s in order to increase safety for community ambulation AEB 10MWT    Baseline  11/29/18: Self-selected: 16.5s =  0.61 m/s; Fastest: 11.4s = 0.88 m/s; 01/08/19: 1.50m/s    Time  12    Status  Achieved            Plan - 01/08/19 1039    Clinical Impression Statement  10th visit goal check established this date showing 2 completed long-term goals in gait speed and TUG (revised); 5xSTS is showing 60% progress. Leg press increased this date, performed single limb bilaterally pt reporting near equal effort. Pt showing great progress all interventions. Session somewhat limited by unrelated chronic Right knee pain. Finished up with SLS practice this date which is limited to less than 5 sec bilat.    Comorbidities  CAD, HTN, DM, hyperlipidemia    Rehab Potential  Good    PT Frequency  2x / week    PT Duration  12 weeks    PT Treatment/Interventions  ADLs/Self Care Home Management;Aquatic Therapy;Biofeedback;Canalith Repostioning;Cryotherapy;Electrical Stimulation;Ultrasound;Moist Heat;Iontophoresis 4mg /ml Dexamethasone;Gait training;Stair training;Functional mobility training;Therapeutic activities;Therapeutic exercise;Balance training;Patient/family education;Neuromuscular re-education;Manual techniques;Joint Manipulations;Spinal Manipulations;Vestibular;Traction;DME Instruction;Cognitive remediation;Orthotic Fit/Training;Passive range of motion;Dry needling;Splinting    PT Next Visit Plan  Continue strengthening and balance exercises, utilize NMES for L ankle strength as appropriate    PT Home Exercise Plan  Access Code: [email or web address]    Consulted and Agree with Plan of Care  Patient        Patient will benefit from skilled therapeutic intervention in order to improve the following deficits and impairments:  Abnormal gait, Decreased coordination, Difficulty walking, Impaired UE functional use, Decreased balance, Decreased mobility, Decreased strength  Visit Diagnosis: Muscle weakness (generalized)  Other lack of coordination  Unsteadiness on feet  Abnormal gait  Acute left-sided weakness     Problem List Patient Active Problem List   Diagnosis Date Noted  . CVA (  cerebrovascular accident) ([place]) 11/17/2018  . Morbid obesity ([place]) 02/07/2015  . Knee pain 02/06/2015  . History of adenomatous polyp of colon 02/06/2015  . Acanthosis nigricans 02/06/2015  . Hyperlipidemia, mixed 01/12/2011  . 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 Pure hypercholesterolemia 03/13/2007  . Essential hypertension 03/13/2007  . Personal history of tobacco use 02/08/2007  . Cardiac enlargement 10/18/2006  . Myocardial infarct, old 09/18/2006  . Controlled diabetes mellitus with renal manifestations ([place]) 09/18/2006  . CAD (coronary artery disease) 09/18/2006   10:59 AM, 01/08/19 Etta Grandchild, PT, DPT Physical Therapist - [facility]  Outpatient Physical Therapy- [place] [phone]     Etta Grandchild 01/08/2019, 10:45 AM  [place] MAIN [facility] SERVICES [address] [place], [place], [address] Phone: [phone]   Fax:  [phone]  Name: Todd Potter MRN: [id number] Date of Birth: 07/24/1950

## 2019-01-08 NOTE — Therapy (Signed)
[place] MAIN [facility] SERVICES [address] [place], [place], [address] Phone: [phone]   Fax:  [phone]  Occupational Therapy Treatment  Patient Details  Name: MENELIK KOSIK MRN: [id number] Date of Birth: 12/20/1950 Referring Provider (OT): Dr. Caryn Section   Encounter Date: 01/08/2019  OT End of Session - 01/08/19 0933    Visit Number  8    Number of Visits  24    Date for OT Re-Evaluation  03/07/19    OT Start Time  0930    OT Stop Time  1015    OT Time Calculation (min)  45 min    Activity Tolerance  Patient tolerated treatment well    Behavior During Therapy  [facility] for tasks assessed/performed       Past Medical History:  Diagnosis Date  . Acanthosis nigricans   . CAD (coronary artery disease)   . Cardiomegaly   . Diabetes mellitus without complication ([place])   . History of adenomatous polyp of colon   . Hyperlipidemia   . Hypertension   . Knee pain   . Myocardial infarct [facility])     Past Surgical History:  Procedure Laterality Date  . CARDIAC CATHETERIZATION     Stent Placement x 5 to RCA   . COLONOSCOPY WITH PROPOFOL N/A 08/06/2016   Procedure: COLONOSCOPY WITH PROPOFOL;  Surgeon: Manya Silvas, MD;  Location: [facility] ENDOSCOPY;  Service: Endoscopy;  Laterality: N/A;  . CYSTECTOMY  1970&1990   x2 on tail bone    There were no vitals filed for this visit.  Subjective Assessment - 01/08/19 0932    Subjective   Pt. reports feeling good today    Patient is accompanied by:  Family member    Pertinent History  Pt. is a 68 y.o. male who was diagnosed with a CVA with Left sided weakness. Pt. resides with his wife, and was previously independent with ADLs, and IADLs prior to onset of the CVA. 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 Pt. enjoying riding motorcycles with his wife.    Limitations  LUE weakness, decreased motor control, and [facility]: [facility] Rhonda W skills.    Currently in Pain?  No/denies      OT TREATMENT    Neuro  muscular re-education:  Pt. worked on grasping 1" resistive cubes alternating thumb opposition to the tip of the 2nd through 5th digits while the board is placed at a vertical angle. Pt. worked on pressing the cubes back into place while alternating isolated 2nd through 5th digit extension.Pt. worked on reaching up to place them in a container with the LUE. Pt. worked on removing the cubes from the container, turning them in his hand, and placing them onto the board before pressing them into place. Pt. worked on tasks to sustain lateral pinch on resistive tweezers while grasping and moving 2" toothpick sticks from a horizontal flat position to a vertical position in order to place it in the holder. Pt. was able to sustain grasp while positioning and extending the wrist/hand in the necessary alignment needed to place the stick through the top of the holder. Pt. had difficulty with holding the resistive grasp when extending his wrist to place the toothpicks into the container. Pt. dropped multiple toothpicks when attempting to extend his left wrist, and place them through the larger hole as the task progressed, and his LUE fatigued.  Therapeutic Exercise:  Pt. performed gross gripping with the left UE grip strengthener. Pt. worked on sustaining  grip while grasping pegs and reaching at various heights. The Gripper was placed at 23.4# of grip strength force.  Response to Treatment  Pt. engaged his LUE, and hand during ADL, and IADL tasks at [place] this weekend. Pt. reports using his left hand to fasten screws when fixing an oven. Pt. presents with less compensation proximally today when manipulating objects with his LUE, and is improving with translatory movements with larger objects. Pt. Presented with minimal compensation proximally with slight leaning to the right, and hiking of the left shoulder. Pt. continues to work on improving LUE functioning during ADLs, and  IADLs.                               OT Long Term Goals - 12/06/18 1748      OT LONG TERM GOAL #1   Title  Pt. will improve grip strength by 10# to be able to sustain a firm hold on a motorcycle clutch.    Baseline  Eval: Pt. is unable    Time  12    Period  Weeks    Status  New    Target Date  03/07/19      OT LONG TERM GOAL #2   Title  Pt. will improve lateral pinch strength by 3# to be able to hold a plate steady.    Baseline  Eval: Pt. has difficulty hold a plate steady, and carrying it.    Time  12    Period  Weeks    Status  New    Target Date  03/07/19      OT LONG TERM GOAL #3   Title  Pt. will independently hike the left side of his pants.    Baseline  Eval: Pt. is unable    Time  12    Period  Weeks    Status  New    Target Date  03/07/19      OT LONG TERM GOAL #4   Title  Pt. will independently wash his RUE using his LUE.    Baseline  Eval: Pt. has difficulty    Time  12    Period  Weeks    Status  On-going    Target Date  03/07/19      OT LONG TERM GOAL #5   Title  Pt. will improvie left hand [facility] skills  to be able to tie his shoes independently    Baseline  Eval: Pt. is unable    Time  12    Period  Weeks    Status  New    Target Date  03/07/19            Plan - 01/08/19 0934    Clinical Impression Statement Pt. engaged his LUE, and hand during ADL, and IADL tasks at [place] this weekend. Pt. reports using his left hand to fasten screws when fixing an oven. Pt. presents with less compensation proximally today when manipulating objects with his LUE, and is improving with translatory movements with larger objects. Pt. continues to work on improving LUE functioning during ADLs, and IADLs.    OT Occupational Profile and History  Detailed Assessment- Review of Records and additional review of physical, cognitive, psychosocial history related to current functional performance    Occupational performance deficits (Please refer to  evaluation for details):  ADL's;IADL's    Body Structure / Function / Physical Skills  ADL;IADL;FMC;ROM;UE functional use;Strength  Rehab Potential  Good    Clinical Decision Making  Several treatment options, min-mod task modification necessary    Comorbidities Affecting Occupational Performance:  May have comorbidities impacting occupational performance    Modification or Assistance to Complete Evaluation   Min-Moderate modification of tasks or assist with assess necessary to complete eval    OT Frequency  2x / week    OT Duration  12 weeks    OT Treatment/Interventions  Self-care/ADL training;Therapeutic activities;Therapeutic exercise;Patient/family education;DME and/or AE instruction;Neuromuscular education    Consulted and Agree with Plan of Care  Patient;Family member/caregiver    Family Member Consulted  Wife       Patient will benefit from skilled therapeutic intervention in order to improve the following deficits and impairments:   Body Structure / Function / Physical Skills: ADL, IADL, FMC, ROM, UE functional use, Strength       Visit Diagnosis: Muscle weakness (generalized)  Other lack of coordination    Problem List Patient Active Problem List   Diagnosis Date Noted  . CVA (cerebrovascular accident) ([place]) 11/17/2018  . Morbid obesity ([place]) 02/07/2015  . Knee pain 02/06/2015  . History of adenomatous polyp of colon 02/06/2015  . Acanthosis nigricans 02/06/2015  . Hyperlipidemia, mixed 01/12/2011  . 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 Pure hypercholesterolemia 03/13/2007  . Essential hypertension 03/13/2007  . Personal history of tobacco use 02/08/2007  . Cardiac enlargement 10/18/2006  . Myocardial infarct, old 09/18/2006  . Controlled diabetes mellitus with renal manifestations ([place]) 09/18/2006  . CAD (coronary artery disease) 09/18/2006    Harrel Carina, MS, OTR/L  01/08/2019, 9:47 AM  [place] MAIN [facility]  SERVICES [address] [place], [place], [address] Phone: [phone]   Fax:  [phone]  Name: KU LEISNER MRN: [id number] Date of Birth: Sep 14, 1950

## 2019-01-10 ENCOUNTER — Encounter: Payer: Self-pay | Admitting: Physical Therapy

## 2019-01-10 ENCOUNTER — Ambulatory Visit: Payer: Medicare Other | Admitting: Occupational Therapy

## 2019-01-10 ENCOUNTER — Other Ambulatory Visit: Payer: Self-pay

## 2019-01-10 ENCOUNTER — Encounter: Payer: Self-pay | Admitting: Occupational Therapy

## 2019-01-10 ENCOUNTER — Ambulatory Visit: Payer: Medicare Other | Admitting: Physical Therapy

## 2019-01-10 DIAGNOSIS — M6281 Muscle weakness (generalized): Secondary | ICD-10-CM

## 2019-01-10 DIAGNOSIS — R269 Unspecified abnormalities of gait and mobility: Secondary | ICD-10-CM

## 2019-01-10 DIAGNOSIS — R278 Other lack of coordination: Secondary | ICD-10-CM

## 2019-01-10 DIAGNOSIS — R531 Weakness: Secondary | ICD-10-CM | POA: Diagnosis not present

## 2019-01-10 DIAGNOSIS — R2681 Unsteadiness on feet: Secondary | ICD-10-CM | POA: Diagnosis not present

## 2019-01-10 NOTE — Therapy (Signed)
[place] MAIN [facility] SERVICES [address] [place], [place], [address] Phone: [phone]   Fax:  [phone]  Physical Therapy Treatment  Patient Details  Name: Todd Potter MRN: [id number] Date of Birth: [birth year] Referring Provider (PT): [name] MD   Encounter Date: 01/10/2019  PT End of Session - 01/10/19 0934    Visit Number  11    Number of Visits  25    Date for PT Re-Evaluation  02/21/19    Authorization Type  initial eval: 11/29/18    PT Start Time  0930    PT Stop Time  1015    PT Time Calculation (min)  45 min    Activity Tolerance  Patient tolerated treatment well;No increased pain    Behavior During Therapy  WFL for tasks assessed/performed       Past Medical History:  Diagnosis Date  . Acanthosis nigricans   . CAD (coronary artery disease)   . Cardiomegaly   . Diabetes mellitus without complication ([place])   . History of adenomatous polyp of colon   . Hyperlipidemia   . Hypertension   . Knee pain   . Myocardial infarct [facility])     Past Surgical History:  Procedure Laterality Date  . CARDIAC CATHETERIZATION     Stent Placement x 5 to RCA   . COLONOSCOPY WITH PROPOFOL N/A 08/06/2016   Procedure: COLONOSCOPY WITH PROPOFOL;  Surgeon: Manya Silvas, MD;  Location: [facility] ENDOSCOPY;  Service: Endoscopy;  Laterality: N/A;  . CYSTECTOMY  1970&1990   x2 on tail bone    There were no vitals filed for this visit.  Subjective Assessment - 01/10/19 0928    Subjective  Patient is doing well today. Reports of soreness in his right knee after walking a mile and a half yesterday.    Pertinent History  Pt reports getting up in the morning on 11/16/18 and upon waking noticed not having any strength in his left hand and his wife encouraged him to go to the hospital. He did not experience and numbness or tingling just weakness in LUE. He did not notice any changes in strength or sensation LLE. The following day 11/17/18 he began  to notice some toe drag of LLE and weakness. He called his doctor who encouraged him to go to the hospital in which he did. 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    Limitations  Walking    How long can you walk comfortably?  Pt walking his cauldesac for 30 minutes now, but he reports this is better than previously.    Patient Stated Goals  riding his motorcycle, fishing without difficulty, walking without difficulty    Currently in Pain?  Yes    Pain Score  3     Pain Location  Knee    Pain Orientation  Right    Pain Descriptors / Indicators  Aching;Sore    Pain Type  Chronic pain    Pain Onset  More than a month ago    Pain Frequency  Intermittent    Multiple Pain Sites  No       There-ex:  Warm up on Nustep BUE/BLE level 56for 5 min   Leg press: LLE, 105# 2x10, RLE 1x10displaying good carryover with slow and controlled motion and avoiding genu recurvatum; Patient c/o right knee stiffness following 10 reps, did not continue.   Seated LAQ LLE 3# x15 with cues to keep ankle DF during ROM to  facilitate improved knee control during gait tasks.   Seated dorsiflexion LLE 3# ankle weight 2x15.  Sit to stand x15 with RLE extended out straight to facilitate increased weight bearing in LLE; CGA required for safety;   TKEs with green tband around L knee 3 sec hold x20 reps; cueing for correct technique and a slow and controlled motion.   Tandem stance with eachLE in front alternating trunk rotation x10 while holding 2000g weighted ball, CGA for safety; cues to face shoulders in direction of turn to facilitate trunk rotation rather than only cervical rotation;  Modified tandem stance with RLE in front, due to patient unable to complete exercise with full tandem stance.  Pt educated throughout session about proper posture and technique with exercises. Improved exercise technique, movement at target joints, use of target muscles after min to mod verbal,  visual, tactile cues.   Response to treatment:  Ptcontinues to display L knee hyperextension during gait, but displays an equal step length today;He demonstrates improvement with LLE leg press progression with minimal fatigue; Challenged by achieving tandem stance during balance exercises, has more success while maintaining semi-tandem stance. Patient fatigue eased with occasional rest breaks throughout session. He denies any increase in pain at end of session.    PT Education - 01/10/19 0934    Education Details  exercise technique/HEP    Person(s) Educated  Patient    Methods  Explanation;Verbal cues    Comprehension  Verbalized understanding;Returned demonstration;Verbal cues required;Need further instruction       PT Short Term Goals - 01/08/19 1023      PT SHORT TERM GOAL #1   Title  Pt will be independent with HEP in order to improve strength and balance in order to decrease fall risk and improve function at home and work.    Time  6    Period  Weeks    Status  Achieved    Target Date  01/10/19        PT Long Term Goals - 01/08/19 1023      PT LONG TERM GOAL #1   Title  Pt will improve BERG by at least 3 points in order to demonstrate clinically significant improvement in balance.    Baseline  11/29/18: 50/56    Time  12    Period  Weeks    Status  On-going      PT LONG TERM GOAL #2   Title  Pt will decrease 5TSTS by at least 3 seconds in order to demonstrate clinically significant improvement in LE strength.    Baseline  11/29/18: 13.66s; 9/21 11.47sec    Time  12    Period  Weeks    Status  On-going    Target Date  02/21/19      PT LONG TERM GOAL #3   Title  Pt will decrease TUG to below 10 seconds/decrease in order to demonstrate decreased fall risk.    Baseline  11/29/18: 14.96s; on 01/08/19: 10.45s (no AD)    Time  12    Period  Weeks    Status  Revised    Target Date  02/21/19      PT LONG TERM GOAL #4   Title  Patient will improve walking speed to  >1.0 m/s in order to increase safety for community ambulation AEB 10MWT    Baseline  11/29/18: Self-selected: 16.5s =  0.61 m/s; Fastest: 11.4s = 0.88 m/s; 01/08/19: 1.80m/s    Time  12    Status  Achieved            Plan - 01/10/19 1143    Clinical Impression Statement  Patient presents with good motivation for therapy today. He demonstrated good ability to perform progressed exercises introduced last session, with improved control over left knee genu recurvatum. Patient is challenged by activities involving a narrow base of support, improved with verbal and tactile cues. Patient wll benefit from continued skilled PT to improve the deficiencies outlines in this note.    Comorbidities  CAD, HTN, DM, hyperlipidemia    Examination-Activity Limitations  Squat;Locomotion Level;Stand    Examination-Participation Restrictions  Driving;Yard Work;Other    Rehab Potential  Good    PT Frequency  2x / week    PT Duration  12 weeks    PT Treatment/Interventions  ADLs/Self Care Home Management;Aquatic Therapy;Biofeedback;Canalith Repostioning;Cryotherapy;Electrical Stimulation;Ultrasound;Moist Heat;Iontophoresis 4mg /ml Dexamethasone;Gait training;Stair training;Functional mobility training;Therapeutic activities;Therapeutic exercise;Balance training;Patient/family education;Neuromuscular re-education;Manual techniques;Joint Manipulations;Spinal Manipulations;Vestibular;Traction;DME Instruction;Cognitive remediation;Orthotic Fit/Training;Passive range of motion;Dry needling;Splinting    PT Next Visit Plan  Continue strengthening and balance exercises, utilize NMES for L ankle strength as appropriate    PT Home Exercise Plan  Access Code: [email or web address]    Consulted and Agree with Plan of Care  Patient       Patient will benefit from skilled therapeutic intervention in order to improve the following deficits and impairments:  Abnormal gait, Decreased coordination, Difficulty walking, Impaired UE functional use,  Decreased balance, Decreased mobility, Decreased strength  Visit Diagnosis: Muscle weakness (generalized)  Other lack of coordination  Unsteadiness on feet  Abnormal gait     Problem List Patient Active Problem List   Diagnosis Date Noted  . CVA (cerebrovascular accident) ([place]) 11/17/2018  . Morbid obesity ([place]) 02/07/2015  . Knee pain 02/06/2015  . History of adenomatous polyp of colon 02/06/2015  . Acanthosis nigricans 02/06/2015  . Hyperlipidemia, mixed 01/12/2011  . 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 Pure hypercholesterolemia 03/13/2007  . Essential hypertension 03/13/2007  . Personal history of tobacco use 02/08/2007  . Cardiac enlargement 10/18/2006  . Myocardial infarct, old 09/18/2006  . Controlled diabetes mellitus with renal manifestations ([place]) 09/18/2006  . CAD (coronary artery disease) 09/18/2006   Jeneen Rinks A. Rosana Hoes, SPT This entire session was performed under direct supervision and direction of a licensed therapist/therapist assistant . I have personally read, edited and approve of the note as written.  Trotter,Margaret PT, DPT 01/10/2019, 3:43 PM  [place] MAIN [facility] SERVICES [address] [place], [place], [address] Phone: [phone]   Fax:  [phone]  Name: Todd Potter MRN: [id number] Date of Birth: Sep 15, 1950

## 2019-01-10 NOTE — Therapy (Signed)
[place] MAIN [facility] SERVICES [address] [place], [place], [address] Phone: [phone]   Fax:  [phone]  Occupational Therapy Treatment  Patient Details  Name: Todd Potter MRN: [id number] Date of Birth: 31-Oct-1950 Referring Provider (OT): Dr. Caryn Section   Encounter Date: 01/10/2019  OT End of Session - 01/10/19 1019    Visit Number  9    Number of Visits  24    Date for OT Re-Evaluation  03/07/19    OT Start Time  1015    OT Stop Time  1100    OT Time Calculation (min)  45 min    Activity Tolerance  Patient tolerated treatment well    Behavior During Therapy  [facility] for tasks assessed/performed       Past Medical History:  Diagnosis Date  . Acanthosis nigricans   . CAD (coronary artery disease)   . Cardiomegaly   . Diabetes mellitus without complication ([place])   . History of adenomatous polyp of colon   . Hyperlipidemia   . Hypertension   . Knee pain   . Myocardial infarct [facility])     Past Surgical History:  Procedure Laterality Date  . CARDIAC CATHETERIZATION     Stent Placement x 5 to RCA   . COLONOSCOPY WITH PROPOFOL N/A 08/06/2016   Procedure: COLONOSCOPY WITH PROPOFOL;  Surgeon: Manya Silvas, MD;  [facility]: [facility];  Service: Endoscopy;  Laterality: N/A;  . CYSTECTOMY  1970&1990   x2 on tail bone    There were no vitals filed for this visit.  Subjective Assessment - 01/10/19 1014    Subjective   Pt. reports feeling good today    Patient is accompanied by:  Family member    Pertinent History  Pt. is a 68 y.o. male who was diagnosed with a CVA with Left sided weakness. Pt. resides with his wife, and was previously independent with ADLs, and IADLs prior to onset of the CVA. 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 Pt. enjoying riding motorcycles with his wife.    Limitations  LUE weakness, decreased motor control, and [facility] skills.    Currently in Pain?  No/denies      OT TREATMENT    Neuro  muscular re-education:  Pt. worked on using his left hand for grasping flipping, turning, and stacking [place] style discs. Pt. worked on Research scientist (physical sciences) through a full rotation. Pt. worked on one set at a standard pace, and 1 set at increased speed. Pt. reports less LUE motor control with increasing speed, and fatigue with the task.  Several discs dropped at the end of the task. Pt. focused on improving [facility] with manipulating nuts and bolts positioned vertically. Pt. was able to unscrew both the 1" and 1/2" nuts. Pt. was able to remove the nuts without dropping them. Pt. worked on [facility] skills manipulating nuts, and bolts on a bolt board. Pt. worked on screwing, and unscrewing extra small nuts, and bolts using the LUE with vision occluded.   Therapeutic Exercise:  Pt. performed gross gripping with grip strengthener. Pt. worked on sustaining grip while grasping pegs and reaching at various heights. The gripper was place at 23.4# of resistive force.Pt. Worked on pinch strengthening in the left hand for lateral, and 3pt. pinch using red, green, and blue resistive clips. Pt. worked on placing the clips at various vertical and horizontal angles. Tactile and verbal cues were required for eliciting the desired movement.  Response to Treatment  Pt. is now using his hand more, and is engaging his left hand more when fixing things. Pt. is using his left hand to help fasten screws. Pt. presents with improved LUE motor control, and strength with decreased compensation proximally, and in the trunk when using the LUE. Pt. continues to present with limited [facility] skills. Pt. continues to work on improving UE functioning duirng ADLs, and IADL tasks.                           OT Long Term Goals - 12/06/18 1748      OT LONG TERM GOAL #1   Title  Pt. will improve grip strength by 10# to be able to sustain a firm hold on a motorcycle clutch.    Baseline  Eval: Pt. is unable    Time  12    Period   Weeks    Status  New    Target Date  03/07/19      OT LONG TERM GOAL #2   Title  Pt. will improve lateral pinch strength by 3# to be able to hold a plate steady.    Baseline  Eval: Pt. has difficulty hold a plate steady, and carrying it.    Time  12    Period  Weeks    Status  New    Target Date  03/07/19      OT LONG TERM GOAL #3   Title  Pt. will independently hike the left side of his pants.    Baseline  Eval: Pt. is unable    Time  12    Period  Weeks    Status  New    Target Date  03/07/19      OT LONG TERM GOAL #4   Title  Pt. will independently wash his RUE using his LUE.    Baseline  Eval: Pt. has difficulty    Time  12    Period  Weeks    Status  On-going    Target Date  03/07/19      OT LONG TERM GOAL #5   Title  Pt. will improvie left hand [facility] skills  to be able to tie his shoes independently    Baseline  Eval: Pt. is unable    Time  12    Period  Weeks    Status  New    Target Date  03/07/19            Plan - 01/10/19 1020    Clinical Impression Statement  Pt. is now using his hand more, and is engaging his left hand more when fixing things. Pt. is using his left hand to help fasten screws. Pt. presents with improved LUE motor control, and strength with decreased compensation proximally, and in the trunk when using the LUE. Pt. continues to present with limited [facility] skills. Pt. continues to work on improving UE functioning duirng ADLs, and IADL tasks.    OT Occupational Profile and History  Detailed Assessment- Review of Records and additional review of physical, cognitive, psychosocial history related to current functional performance    Occupational performance deficits (Please refer to evaluation for details):  ADL's;IADL's    Body Structure / Function / Physical Skills  ADL;IADL;FMC;ROM;UE functional use;Strength    Rehab Potential  Good    Clinical Decision Making  Several treatment options, min-mod task modification necessary    Comorbidities  Affecting Occupational Performance:  May have comorbidities impacting  occupational performance    Modification or Assistance to Complete Evaluation   Min-Moderate modification of tasks or assist with assess necessary to complete eval    OT Frequency  2x / week    OT Duration  12 weeks    OT Treatment/Interventions  Self-care/ADL training;Therapeutic activities;Therapeutic exercise;Patient/family education;DME and/or AE instruction;Neuromuscular education    Consulted and Agree with Plan of Care  Patient;Family member/caregiver    Family Member Consulted  Wife       Patient will benefit from skilled therapeutic intervention in order to improve the following deficits and impairments:   Body Structure / Function / Physical Skills: ADL, IADL, FMC, ROM, UE functional use, Strength       Visit Diagnosis: Muscle weakness (generalized)  Other lack of coordination    Problem List Patient Active Problem List   Diagnosis Date Noted  . CVA (cerebrovascular accident) ([place]) 11/17/2018  . Morbid obesity ([place]) 02/07/2015  . Knee pain 02/06/2015  . History of adenomatous polyp of colon 02/06/2015  . Acanthosis nigricans 02/06/2015  . Hyperlipidemia, mixed 01/12/2011  . 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 Pure hypercholesterolemia 03/13/2007  . Essential hypertension 03/13/2007  . Personal history of tobacco use 02/08/2007  . Cardiac enlargement 10/18/2006  . Myocardial infarct, old 09/18/2006  . Controlled diabetes mellitus with renal manifestations ([place]) 09/18/2006  . CAD (coronary artery disease) 09/18/2006    Harrel Carina, MS, OTR/L 01/10/2019, 10:38 AM  [place] MAIN [facility] SERVICES [address] [place], [place], [address] Phone: [phone]   Fax:  [phone]  Name: Todd Potter MRN: [id number] Date of Birth: 1950-11-08

## 2019-01-15 ENCOUNTER — Ambulatory Visit: Payer: Medicare Other | Admitting: Occupational Therapy

## 2019-01-15 ENCOUNTER — Other Ambulatory Visit: Payer: Self-pay

## 2019-01-15 ENCOUNTER — Telehealth: Payer: Self-pay

## 2019-01-15 ENCOUNTER — Encounter: Payer: Self-pay | Admitting: Physical Therapy

## 2019-01-15 ENCOUNTER — Ambulatory Visit: Payer: Medicare Other | Admitting: Physical Therapy

## 2019-01-15 ENCOUNTER — Encounter: Payer: Self-pay | Admitting: Occupational Therapy

## 2019-01-15 DIAGNOSIS — R531 Weakness: Secondary | ICD-10-CM | POA: Diagnosis not present

## 2019-01-15 DIAGNOSIS — M6281 Muscle weakness (generalized): Secondary | ICD-10-CM | POA: Diagnosis not present

## 2019-01-15 DIAGNOSIS — R278 Other lack of coordination: Secondary | ICD-10-CM

## 2019-01-15 DIAGNOSIS — R269 Unspecified abnormalities of gait and mobility: Secondary | ICD-10-CM | POA: Diagnosis not present

## 2019-01-15 DIAGNOSIS — R2681 Unsteadiness on feet: Secondary | ICD-10-CM | POA: Diagnosis not present

## 2019-01-15 NOTE — Telephone Encounter (Signed)
Pt came in the office and is requesting samples of the following medications:  1. empagliflozin (JARDIANCE) 25 MG TABS tablet  2. sitaGLIPtin-metformin (JANUMET) 50-1000 MG tablet  Pt is going to therapy and would like a call if he can come back to the office to pick up samples. Please advise. Thanks TNP

## 2019-01-15 NOTE — Therapy (Signed)
[place] MAIN [facility] SERVICES [address] [place], [place], [address] Phone: [phone]   Fax:  [phone]  Physical Therapy Treatment  Patient Details  Name: Todd Potter MRN: [id number] Date of Birth: 09-12-1950 Referring Provider (PT): [name] MD   Encounter Date: 01/15/2019  PT End of Session - 01/15/19 0939    Visit Number  12    Number of Visits  25    Date for PT Re-Evaluation  02/21/19    Authorization Type  initial eval: 11/29/18    PT Start Time  0933    PT Stop Time  1018    PT Time Calculation (min)  45 min    Activity Tolerance  Patient tolerated treatment well;No increased pain    Behavior During Therapy  WFL for tasks assessed/performed       Past Medical History:  Diagnosis Date  . Acanthosis nigricans   . CAD (coronary artery disease)   . Cardiomegaly   . Diabetes mellitus without complication ([place])   . History of adenomatous polyp of colon   . Hyperlipidemia   . Hypertension   . Knee pain   . Myocardial infarct [facility])     Past Surgical History:  Procedure Laterality Date  . CARDIAC CATHETERIZATION     Stent Placement x 5 to RCA   . COLONOSCOPY WITH PROPOFOL N/A 08/06/2016   Procedure: COLONOSCOPY WITH PROPOFOL;  Surgeon: Manya Silvas, MD;  Location: [facility] ENDOSCOPY;  Service: Endoscopy;  Laterality: N/A;  . CYSTECTOMY  1970&1990   x2 on tail bone    There were no vitals filed for this visit.  Subjective Assessment - 01/15/19 0937    Subjective  Patient doing well today. No reports of pain, but right knee feels stiff today.    Pertinent History  Pt reports getting up in the morning on 11/16/18 and upon waking noticed not having any strength in his left hand and his wife encouraged him to go to the hospital. He did not experience and numbness or tingling just weakness in LUE. He did not notice any changes in strength or sensation LLE. The following day 11/17/18 he began to notice some toe drag of LLE  and weakness. He called his doctor who encouraged him to go to the hospital in which he did. 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    Limitations  Walking    How long can you walk comfortably?  Pt walking his cauldesac for 30 minutes now, but he reports this is better than previously.    Patient Stated Goals  riding his motorcycle, fishing without difficulty, walking without difficulty    Currently in Pain?  No/denies    Pain Score  0-No pain    Pain Onset  More than a month ago    Multiple Pain Sites  No         There-ex:  Warm up on Nustep BUE/BLE level70for5 min  Leg press: LLE, 105# 2x10, RLE 1x10displaying good carryover with slow and controlled motion and avoiding genu recurvatum; Patient c/o right knee stiffness following 10 reps, did not continue.   Seated LAQ LLE 3# 1x15 with cues to keep ankle DF during ROM to facilitate improved knee control during gait tasks.   Seated dorsiflexion LLE green theraband 2x15. Advanced HEP for better ankle strengthening, see patient instructions;  NMR: Modified tandem stance BUE ball pass with yellow weighted ball x10 reps each foot in front with increased difficulty  noted with RLE ahead of LLE with min A required for balance control and stability;   Semi-Tandem stance with each LE in front alternating trunk rotation x10 while holding 2000g weighted ball, CGA for safety; cues to face shoulders in direction of turn to facilitate trunk rotation rather than only cervical rotation; Patient exhibits some compensation with increased RLE hip ER to allow increased trunk rotation with less loss of balance;   Tandem stance on firm surface:  Self ball toss x 10 reps each foot in front. CGA required for safety; He requires min A to attain position and exhibits increased difficulty with RLE ahead of LLE due to instability with increased LLE weight bearing;               Star kick outs on firm surface to challenge SLS x    5   Reps each foot in center with Min A to [place] for safety; Patient required min VCs for proper positioning and exercise technique including to avoid excessive ROM for better stance control;   Pt educated throughout session about proper posture and technique with exercises. Improved exercise technique, movement at target joints, use of target muscles after min to mod verbal, visual, tactile cues.   Response to treatment:  Ptcontinues to display L knee hyperextension during gait, but displays an equal step length today; Challenged by maintaining tandem and semi tandem stance during balance exercises, namely with RLE in front. Patient fatigue eased with occasional rest breaks throughout session. He denies any increase in pain at end of session.      PT Education - 01/15/19 [id number]    Education Details  exercise technique/HEP    Person(s) Educated  Patient    Methods  Explanation;Verbal cues    Comprehension  Verbalized understanding;Returned demonstration;Verbal cues required;Need further instruction       PT Short Term Goals - 01/08/19 1023      PT SHORT TERM GOAL #1   Title  Pt will be independent with HEP in order to improve strength and balance in order to decrease fall risk and improve function at home and work.    Time  6    Period  Weeks    Status  Achieved    Target Date  01/10/19        PT Long Term Goals - 01/08/19 1023      PT LONG TERM GOAL #1   Title  Pt will improve BERG by at least 3 points in order to demonstrate clinically significant improvement in balance.    Baseline  11/29/18: 50/56    Time  12    Period  Weeks    Status  On-going      PT LONG TERM GOAL #2   Title  Pt will decrease 5TSTS by at least 3 seconds in order to demonstrate clinically significant improvement in LE strength.    Baseline  11/29/18: 13.66s; 9/21 11.47sec    Time  12    Period  Weeks    Status  On-going    Target Date  02/21/19      PT LONG TERM GOAL #3   Title  Pt will  decrease TUG to below 10 seconds/decrease in order to demonstrate decreased fall risk.    Baseline  11/29/18: 14.96s; on 01/08/19: 10.45s (no AD)    Time  12    Period  Weeks    Status  Revised    Target Date  02/21/19      PT  LONG TERM GOAL #4   Title  Patient will improve walking speed to >1.0 m/s in order to increase safety for community ambulation AEB 10MWT    Baseline  11/29/18: Self-selected: 16.5s =  0.61 m/s; Fastest: 11.4s = 0.88 m/s; 01/08/19: 1.41m/s    Time  12    Status  Achieved            Plan - 01/15/19 1223    Clinical Impression Statement  Patient presents with good motivation for therapy today. He demonstrated good carry-over from last session with trunk rotation balance activities, exhibiting less frequent loss of balance and reaching for upper extremity support. Patient balance most challenged when right foot is in front with semi-tandem and tandem stance, requiring min to mod asssit and VC to regain balance. Patien will benefit from continued skilled PT to improve the deficits outlined in this note.    Comorbidities  CAD, HTN, DM, hyperlipidemia    Examination-Activity Limitations  Squat;Locomotion Level;Stand    Examination-Participation Restrictions  Driving;Yard Work;Other    Rehab Potential  Good    PT Frequency  2x / week    PT Duration  12 weeks    PT Treatment/Interventions  ADLs/Self Care Home Management;Aquatic Therapy;Biofeedback;Canalith Repostioning;Cryotherapy;Electrical Stimulation;Ultrasound;Moist Heat;Iontophoresis 4mg /ml Dexamethasone;Gait training;Stair training;Functional mobility training;Therapeutic activities;Therapeutic exercise;Balance training;Patient/family education;Neuromuscular re-education;Manual techniques;Joint Manipulations;Spinal Manipulations;Vestibular;Traction;DME Instruction;Cognitive remediation;Orthotic Fit/Training;Passive range of motion;Dry needling;Splinting    PT Next Visit Plan  Continue strengthening and balance exercises,  utilize NMES for L ankle strength as appropriate    PT Home Exercise Plan  Access Code: [email or web address]    Consulted and Agree with Plan of Care  Patient       Patient will benefit from skilled therapeutic intervention in order to improve the following deficits and impairments:  Abnormal gait, Decreased coordination, Difficulty walking, Impaired UE functional use, Decreased balance, Decreased mobility, Decreased strength  Visit Diagnosis: Muscle weakness (generalized)  Other lack of coordination  Unsteadiness on feet  Abnormal gait     Problem List Patient Active Problem List   Diagnosis Date Noted  . CVA (cerebrovascular accident) ([place]) 11/17/2018  . Morbid obesity ([place]) 02/07/2015  . Knee pain 02/06/2015  . History of adenomatous polyp of colon 02/06/2015  . Acanthosis nigricans 02/06/2015  . Hyperlipidemia, mixed 01/12/2011  . 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 Pure hypercholesterolemia 03/13/2007  . Essential hypertension 03/13/2007  . Personal history of tobacco use 02/08/2007  . Cardiac enlargement 10/18/2006  . Myocardial infarct, old 09/18/2006  . Controlled diabetes mellitus with renal manifestations ([place]) 09/18/2006  . CAD (coronary artery disease) 09/18/2006   Jeneen Rinks A. Rosana Hoes, SPT This entire session was performed under direct supervision and direction of a licensed therapist/therapist assistant . I have personally read, edited and approve of the note as written.  Trotter,Margaret PT, DPT 01/15/2019, 2:26 PM  [place] MAIN [facility] SERVICES [address] [place], [place], [address] Phone: [phone]   Fax:  [phone]  Name: Todd Potter MRN: [id number] Date of Birth: Oct 30, 1950

## 2019-01-15 NOTE — Therapy (Signed)
[place] MAIN [facility] SERVICES [place], [place], [address] Phone: [phone]   Fax:  [phone]  Occupational Therapy Progress Note  Dates of reporting period  11/18/2018   to   01/15/2019  Patient Details  Name: Todd Potter MRN: [id number] Date of Birth: May 24, 1950 Referring Provider (OT): Dr. Caryn Section   Encounter Date: 01/15/2019  OT End of Session - 01/15/19 1032    Visit Number  10    Number of Visits  24    Date for OT Re-Evaluation  03/07/19    Authorization Type  Progress report period starting 01/17/2019    OT Start Time  1015    OT Stop Time  1100    OT Time Calculation (min)  45 min    Activity Tolerance  Patient tolerated treatment well    Behavior During Therapy  [facility] for tasks assessed/performed       Past Medical History:  Diagnosis Date  . Acanthosis nigricans   . CAD (coronary artery disease)   . Cardiomegaly   . Diabetes mellitus without complication ([place])   . History of adenomatous polyp of colon   . Hyperlipidemia   . Hypertension   . Knee pain   . Myocardial infarct [facility])     Past Surgical History:  Procedure Laterality Date  . CARDIAC CATHETERIZATION     Stent Placement x 5 to RCA   . COLONOSCOPY WITH PROPOFOL N/A 08/06/2016   Procedure: COLONOSCOPY WITH PROPOFOL;  Surgeon: Manya Silvas, MD;  Location: [facility] ENDOSCOPY;  Service: Endoscopy;  Laterality: N/A;  . CYSTECTOMY  1970&1990   x2 on tail bone    There were no vitals filed for this visit.  Subjective Assessment - 01/15/19 1020    Subjective   Pt. reports feeling good today    Patient is accompanied by:  Family member    Pertinent History  Pt. is a 68 y.o. male who was diagnosed with a CVA with Left sided weakness. Pt. resides with his wife, and was previously independent with ADLs, and IADLs prior to onset of the CVA. 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 Pt. enjoying riding motorcycles with his wife.    Limitations  LUE weakness, decreased motor control, and [facility] skills.    Patient Stated Goals  To regain use of his LUE    Currently in Pain?  No/denies         [facility] OT Assessment - 01/15/19 0001      Coordination   Left 9 Hole Peg Test  32      Strength   Overall Strength Comments  LUE strength 4+/5, abduction 4-/5, elbow, and wrist flexion, and extension 5/5      Hand Function   Left Hand Grip (lbs)  46    Left Hand Lateral Pinch  11 lbs    Left 3 point pinch  11 lbs      OT TREATMENT    Measurements were obtained, and goals were reviewed.  Neuro muscular re-education:  Pt. worked on using his LUE for grasping, flipping and stacking 2" large pegs on the Instructo board placed at a tabletop surface. Pt. presented with increased compensation proximally with increased leaning to the right, and hiking his left shoulder when using his left hand. Pt. worked on grasping 1" resistive cubes alternating thumb opposition to the tip of the 2nd through 5th digits while the board is placed at a vertical angle. Pt. worked  on pressing the cubes back into place while alternating isolated 2nd through 5th digit extension.  Therapeutic Exercise:  Pt. performed gross gripping with grip strengthener. Pt. worked on sustaining grip while grasping pegs and reaching at various heights. The gripper was placed at 23.4# of force.  Response to Treatment  Pt. is making steady progress overall with left UE strength, and hand function skills. Pt. has made excellent progress with left [facility] skills, speed, and dexterity. Pt. Has improved with left grip, and pinch strength. Pt. is using his left hand to engage more during daily tasks, and is using the theraputty exercises daily. Pt. continues to work on improving LUE strength, and [facility] skills in order to improve overall ADL, and IADL functioning.                    OT Education - 01/15/19 1032    Education Details  [place], LUE strength    Person(s)  Educated  Patient    Methods  Explanation    Comprehension  Verbalized understanding;Returned demonstration          OT Long Term Goals - 01/15/19 1042      OT LONG TERM GOAL #1   Title  Pt. will improve grip strength by 10# to be able to sustain a firm hold on a motorcycle clutch.    Baseline  01/15/2019: Pt. is improving with legft hand grip, and is now able to a motorcycle clutch, however susatined firm gripping in limited.    Time  12    Period  Weeks    Status  On-going    Target Date  03/07/19      OT LONG TERM GOAL #2   Title  Pt. will improve lateral pinch strength by 3# to be able to hold a plate steady.    Baseline  01/15/2019: Pt. is able to hold a plate straight without tipping it with his left hand.    Time  12    Period  Weeks    Status  On-going    Target Date  03/07/19      OT LONG TERM GOAL #3   Title  Pt. will independently hike the left side of his pants.    Baseline  01/15/2019: Pt. wis improving with hiking his pants with his left hand    Time  12    Period  Weeks    Status  On-going    Target Date  03/07/19      OT LONG TERM GOAL #4   Title  Pt. will independently wash his RUE using his LUE.    Baseline  01/15/2019: Limited grip on the bathclooth    Time  12    Period  Weeks    Status  On-going    Target Date  03/07/19      OT LONG TERM GOAL #5   Title  Pt. will improvie left hand [facility] skills  to be able to tie his shoes independently    Baseline  01/15/2019: Pt. has improved with tying his shoes , however it takes increased time to complete.    Time  12    Period  Weeks    Status  New    Target Date  03/07/19            Plan - 01/15/19 1033    Clinical Impression Statement  Pt. reports that he was not able to go to [facility] this past weekend. Pt. is making  steady progress overall with left UE strength, and hand function skills. Pt. has made excellent progress with left [facility] skills, speed, and dexterity. Pt. Has improved with left  grip, and pinch strength.Pt. is using his left hand to engage more during daily tasks, and is using the theraputty exercises daily. Pt. continues to work on improving UE strength, and [facility] skills in order to improve overall ADL, and IADL functioning.    OT Occupational Profile and History  Detailed Assessment- Review of Records and additional review of physical, cognitive, psychosocial history related to current functional performance    Occupational performance deficits (Please refer to evaluation for details):  ADL's;IADL's    Body Structure / Function / Physical Skills  ADL;IADL;FMC;ROM;UE functional use;Strength    Rehab Potential  Good    Clinical Decision Making  Several treatment options, min-mod task modification necessary    Comorbidities Affecting Occupational Performance:  May have comorbidities impacting occupational performance    Modification or Assistance to Complete Evaluation   Min-Moderate modification of tasks or assist with assess necessary to complete eval    OT Frequency  2x / week    OT Duration  12 weeks    OT Treatment/Interventions  Self-care/ADL training;Therapeutic activities;Therapeutic exercise;Patient/family education;DME and/or AE instruction;Neuromuscular education    Consulted and Agree with Plan of Care  Patient;Family member/caregiver    Family Member Consulted  Wife       Patient will benefit from skilled therapeutic intervention in order to improve the following deficits and impairments:   Body Structure / Function / Physical Skills: ADL, IADL, FMC, ROM, UE functional use, Strength       Visit Diagnosis: Muscle weakness (generalized)  Other lack of coordination    Problem List Patient Active Problem List   Diagnosis Date Noted  . CVA (cerebrovascular accident) ([place]) 11/17/2018  . Morbid obesity ([place]) 02/07/2015  . Knee pain 02/06/2015  . History of adenomatous polyp of colon 02/06/2015  . Acanthosis nigricans 02/06/2015  . Hyperlipidemia,  mixed 01/12/2011  . 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 Pure hypercholesterolemia 03/13/2007  . Essential hypertension 03/13/2007  . Personal history of tobacco use 02/08/2007  . Cardiac enlargement 10/18/2006  . Myocardial infarct, old 09/18/2006  . Controlled diabetes mellitus with renal manifestations ([place]) 09/18/2006  . CAD (coronary artery disease) 09/18/2006    Harrel Carina, MS, OTR/L 01/15/2019, 11:59 AM  [place] MAIN [facility] SERVICES [address] [place], [place], [address] Phone: [phone]   Fax:  [phone]  Name: JAXSUN ESPANA MRN: [id number] Date of Birth: 07-08-1950

## 2019-01-15 NOTE — Telephone Encounter (Signed)
Please advise of ok to give samples. We have 2 bottles of Janumet 50-1000, and 2 bottles of Jardiance 25mg  available.

## 2019-01-15 NOTE — Patient Instructions (Signed)
Access Code: [email or web address]  URL: [email or web address]  Date: 01/15/2019  Prepared by: Blanche East   Exercises Seated Ankle Dorsiflexion with Resistance - 15 reps - 2 sets - 2x daily - 7x weekly

## 2019-01-16 NOTE — Telephone Encounter (Signed)
That's fine, he can have two sample bottles of each.

## 2019-01-16 NOTE — Telephone Encounter (Signed)
Patient advised as below.  

## 2019-01-17 ENCOUNTER — Ambulatory Visit: Payer: Medicare Other | Admitting: Occupational Therapy

## 2019-01-17 ENCOUNTER — Other Ambulatory Visit: Payer: Self-pay

## 2019-01-17 ENCOUNTER — Ambulatory Visit: Payer: Medicare Other | Admitting: Physical Therapy

## 2019-01-17 ENCOUNTER — Encounter: Payer: Self-pay | Admitting: Physical Therapy

## 2019-01-17 ENCOUNTER — Encounter: Payer: Self-pay | Admitting: Occupational Therapy

## 2019-01-17 DIAGNOSIS — M6281 Muscle weakness (generalized): Secondary | ICD-10-CM | POA: Diagnosis not present

## 2019-01-17 DIAGNOSIS — R278 Other lack of coordination: Secondary | ICD-10-CM

## 2019-01-17 DIAGNOSIS — R269 Unspecified abnormalities of gait and mobility: Secondary | ICD-10-CM

## 2019-01-17 DIAGNOSIS — R531 Weakness: Secondary | ICD-10-CM | POA: Diagnosis not present

## 2019-01-17 DIAGNOSIS — R2681 Unsteadiness on feet: Secondary | ICD-10-CM

## 2019-01-17 NOTE — Therapy (Signed)
[place] MAIN [facility] SERVICES [address] [place], [place], [address] Phone: [phone]   Fax:  [phone]  Physical Therapy Treatment  Patient Details  Name: Todd Potter MRN: [id number] Date of Birth: Nov 22, 1950 Referring Provider (PT): [name] MD   Encounter Date: 01/17/2019  PT End of Session - 01/17/19 1000    Visit Number  13    Number of Visits  25    Date for PT Re-Evaluation  02/21/19    Authorization Type  initial eval: 11/29/18    PT Start Time  1015    PT Stop Time  1055    PT Time Calculation (min)  40 min    Activity Tolerance  Patient tolerated treatment well;No increased pain    Behavior During Therapy  WFL for tasks assessed/performed       Past Medical History:  Diagnosis Date  . Acanthosis nigricans   . CAD (coronary artery disease)   . Cardiomegaly   . Diabetes mellitus without complication ([place])   . History of adenomatous polyp of colon   . Hyperlipidemia   . Hypertension   . Knee pain   . Myocardial infarct [facility])     Past Surgical History:  Procedure Laterality Date  . CARDIAC CATHETERIZATION     Stent Placement x 5 to RCA   . COLONOSCOPY WITH PROPOFOL N/A 08/06/2016   Procedure: COLONOSCOPY WITH PROPOFOL;  Surgeon: Manya Silvas, MD;  Location: [facility] ENDOSCOPY;  Service: Endoscopy;  Laterality: N/A;  . CYSTECTOMY  1970&1990   x2 on tail bone    There were no vitals filed for this visit.        Treatment: Octane fitness x 5 mins L 2 Standing on blue foam and step stool and trunk rotation, single leg L standing with right LE on stool and 3 lbs rod for rotation Standing on foam and tapping to dina disk laterally and fwd with RLE to challenge LLE on foam x 20  Standing tandem with head turns x 20  Standing on star with LLE in the middle and RLE tapping to the outside of the Star x 10 reps of 5 outside lines Leg press 105 LLE only x 20  GTB L ankle DF x 20, eversion x 20  LAQ with 4  lbs and 5 sec hold x 20  Good tolerance of exercises and some fatigue with LLE knee and some mild stiffness and clicking/grinding of R knee   Pt educated throughout session about proper posture and technique with exercises. Improved exercise technique, movement at target joints, use of target muscles after min to mod verbal, visual, tactile cues.                   PT Education - 01/17/19 1000    Education Details  HEP    Person(s) Educated  Patient    Methods  Explanation;Demonstration;Verbal cues       PT Short Term Goals - 01/08/19 1023      PT SHORT TERM GOAL #1   Title  Pt will be independent with HEP in order to improve strength and balance in order to decrease fall risk and improve function at home and work.    Time  6    Period  Weeks    Status  Achieved    Target Date  01/10/19        PT Long Term Goals - 01/08/19 1023      PT LONG TERM  GOAL #1   Title  Pt will improve BERG by at least 3 points in order to demonstrate clinically significant improvement in balance.    Baseline  11/29/18: 50/56    Time  12    Period  Weeks    Status  On-going      PT LONG TERM GOAL #2   Title  Pt will decrease 5TSTS by at least 3 seconds in order to demonstrate clinically significant improvement in LE strength.    Baseline  11/29/18: 13.66s; 9/21 11.47sec    Time  12    Period  Weeks    Status  On-going    Target Date  02/21/19      PT LONG TERM GOAL #3   Title  Pt will decrease TUG to below 10 seconds/decrease in order to demonstrate decreased fall risk.    Baseline  11/29/18: 14.96s; on 01/08/19: 10.45s (no AD)    Time  12    Period  Weeks    Status  Revised    Target Date  02/21/19      PT LONG TERM GOAL #4   Title  Patient will improve walking speed to >1.0 m/s in order to increase safety for community ambulation AEB 10MWT    Baseline  11/29/18: Self-selected: 16.5s =  0.61 m/s; Fastest: 11.4s = 0.88 m/s; 01/08/19: 1.19m/s    Time  12    Status  Achieved               Patient will benefit from skilled therapeutic intervention in order to improve the following deficits and impairments:     Visit Diagnosis: Muscle weakness (generalized)  Other lack of coordination  Unsteadiness on feet  Abnormal gait  Acute left-sided weakness     Problem List Patient Active Problem List   Diagnosis Date Noted  . CVA (cerebrovascular accident) ([place]) 11/17/2018  . Morbid obesity ([place]) 02/07/2015  . Knee pain 02/06/2015  . History of adenomatous polyp of colon 02/06/2015  . Acanthosis nigricans 02/06/2015  . Hyperlipidemia, mixed 01/12/2011  . 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 Pure hypercholesterolemia 03/13/2007  . Essential hypertension 03/13/2007  . Personal history of tobacco use 02/08/2007  . Cardiac enlargement 10/18/2006  . Myocardial infarct, old 09/18/2006  . Controlled diabetes mellitus with renal manifestations ([place]) 09/18/2006  . CAD (coronary artery disease) 09/18/2006    Alanson Puls, PT DPT 01/17/2019, 10:01 AM  [place] MAIN [facility] SERVICES [address] [place], [place], [address] Phone: [phone]   Fax:  [phone]  Name: Todd Potter MRN: [id number] Date of Birth: Nov 04, 1950

## 2019-01-17 NOTE — Therapy (Signed)
[place] MAIN [facility] SERVICES [address] [place], [place], [address] Phone: [phone]   Fax:  [phone]  Occupational Therapy Treatment  Patient Details  Name: Todd Potter MRN: [id number] Date of Birth: 19-Sep-1950 Referring Provider (OT): Dr. Caryn Section   Encounter Date: 01/17/2019  OT End of Session - 01/17/19 0934    Visit Number  11    Number of Visits  24    Date for OT Re-Evaluation  03/07/19    Authorization Type  Progress report period starting 01/17/2019    OT Start Time  0930    OT Stop Time  1015    OT Time Calculation (min)  45 min    Activity Tolerance  Patient tolerated treatment well    Behavior During Therapy  [facility] for tasks assessed/performed       Past Medical History:  Diagnosis Date  . Acanthosis nigricans   . CAD (coronary artery disease)   . Cardiomegaly   . Diabetes mellitus without complication ([place])   . History of adenomatous polyp of colon   . Hyperlipidemia   . Hypertension   . Knee pain   . Myocardial infarct [facility])     Past Surgical History:  Procedure Laterality Date  . CARDIAC CATHETERIZATION     Stent Placement x 5 to RCA   . COLONOSCOPY WITH PROPOFOL N/A 08/06/2016   Procedure: COLONOSCOPY WITH PROPOFOL;  Surgeon: Manya Silvas, MD;  Location: [facility] ENDOSCOPY;  Service: Endoscopy;  Laterality: N/A;  . CYSTECTOMY  1970&1990   x2 on tail bone    There were no vitals filed for this visit.  Subjective Assessment - 01/17/19 0934    Subjective   Pt. reports feeling good today    Patient is accompanied by:  Family member    Pertinent History  Pt. is a 68 y.o. male who was diagnosed with a CVA with Left sided weakness. Pt. resides with his wife, and was previously independent with ADLs, and IADLs prior to onset of the CVA. 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 Pt. enjoying riding motorcycles with his wife.    Currently in Pain?  No/denies      OT TREATMENT    Neuro  muscular re-education:  Pt. worked on grasping 1" resistive cubes alternating thumb opposition to the tip of the 2nd through 5th digits while the board is placed at a vertical angle. Pt. worked on pressing the cubes back into place while alternating isolated 2nd through 5th digit extension. Pt. worked on grasping, and manipulating 1/2" washers from a magnetic dish using point grasp pattern. Pt. worked on reaching up, stabilizing, and sustaining shoulder elevation while placing the washer over a small precise target on vertical dowels positioned at various angles. Pt. worked on [facility] skills grasping, and storing 1/2"  Flat marbles, and translatory movements moving the objects from his palm to the tip of his 2nd digit, and thumb.  Therapeutic Exercise:  Pt. performed gross gripping with grip strengthener. Pt. worked on sustaining grip while grasping pegs and reaching at various heights. The gripper was at 23.4# of grip strength force. Pt. Worked on pinch strengthening in the left hand for lateral, and 3pt. pinch using yellow, red, green, and blue resistive clips. Pt. worked on placing the clips at various vertical and horizontal angles. Tactile and verbal cues were required for eliciting the desired movement.  Response to Treatment  Pt. is making progress overall with left hand function skills.  Pt. Is improving with pinch strengthening, and has less compensation proximally. Pt. continues to present with limited LUE strength, motor control, and [facility] skills, and continues to work on these skills in order to improve overall ADL, and IADL functioning.                         OT Education - 01/17/19 0948    Education Details  [place], LUE strength    Person(s) Educated  Patient    Methods  Explanation    Comprehension  Verbalized understanding;Returned demonstration          OT Long Term Goals - 01/15/19 1042      OT LONG TERM GOAL #1   Title  Pt. will improve grip strength by 10#  to be able to sustain a firm hold on a motorcycle clutch.    Baseline  01/15/2019: Pt. is improving with legft hand grip, and is now able to a motorcycle clutch, however susatined firm gripping in limited.    Time  12    Period  Weeks    Status  On-going    Target Date  03/07/19      OT LONG TERM GOAL #2   Title  Pt. will improve lateral pinch strength by 3# to be able to hold a plate steady.    Baseline  01/15/2019: Pt. is able to hold a plate straight without tipping it with his left hand.    Time  12    Period  Weeks    Status  On-going    Target Date  03/07/19      OT LONG TERM GOAL #3   Title  Pt. will independently hike the left side of his pants.    Baseline  01/15/2019: Pt. wis improving with hiking his pants with his left hand    Time  12    Period  Weeks    Status  On-going    Target Date  03/07/19      OT LONG TERM GOAL #4   Title  Pt. will independently wash his RUE using his LUE.    Baseline  01/15/2019: Limited grip on the bathclooth    Time  12    Period  Weeks    Status  On-going    Target Date  03/07/19      OT LONG TERM GOAL #5   Title  Pt. will improvie left hand [facility] skills  to be able to tie his shoes independently    Baseline  01/15/2019: Pt. has improved with tying his shoes , however it takes increased time to complete.    Time  12    Period  Weeks    Status  New    Target Date  03/07/19            Plan - 01/17/19 0949    Clinical Impression Statement Pt. is making progress overall with left hand function skills. Pt. Is improving with pinch strengthening, and has less compensation proximally. Pt. continues to present with limited LUE strength, motor control, and [facility] skills, and continues to work on these skills in order to improve overall ADL, and IADL functioning.    OT Occupational Profile and History  Detailed Assessment- Review of Records and additional review of physical, cognitive, psychosocial history related to current functional performance     Occupational performance deficits (Please refer to evaluation for details):  ADL's;IADL's    Body Structure / Function / Physical  Skills  ADL;IADL;FMC;ROM;UE functional use;Strength    Rehab Potential  Good    Clinical Decision Making  Several treatment options, min-mod task modification necessary    Comorbidities Affecting Occupational Performance:  May have comorbidities impacting occupational performance    Modification or Assistance to Complete Evaluation   Min-Moderate modification of tasks or assist with assess necessary to complete eval    OT Frequency  2x / week    OT Duration  12 weeks    OT Treatment/Interventions  Self-care/ADL training;Therapeutic activities;Therapeutic exercise;Patient/family education;DME and/or AE instruction;Neuromuscular education    Consulted and Agree with Plan of Care  Patient;Family member/caregiver    Family Member Consulted  Wife       Patient will benefit from skilled therapeutic intervention in order to improve the following deficits and impairments:   Body Structure / Function / Physical Skills: ADL, IADL, FMC, ROM, UE functional use, Strength       Visit Diagnosis: Muscle weakness (generalized)  Other lack of coordination    Problem List Patient Active Problem List   Diagnosis Date Noted  . CVA (cerebrovascular accident) ([place]) 11/17/2018  . Morbid obesity ([place]) 02/07/2015  . Knee pain 02/06/2015  . History of adenomatous polyp of colon 02/06/2015  . Acanthosis nigricans 02/06/2015  . Hyperlipidemia, mixed 01/12/2011  . 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 Pure hypercholesterolemia 03/13/2007  . Essential hypertension 03/13/2007  . Personal history of tobacco use 02/08/2007  . Cardiac enlargement 10/18/2006  . Myocardial infarct, old 09/18/2006  . Controlled diabetes mellitus with renal manifestations ([place]) 09/18/2006  . CAD (coronary artery disease) 09/18/2006    Harrel Carina, MS, OTR/L 01/17/2019, 10:02 AM  [place] MAIN [facility] SERVICES [address] [place], [place], [address] Phone: [phone]   Fax:  [phone]  Name: Todd Potter MRN: [id number] Date of Birth: Jun 18, 1950

## 2019-01-19 ENCOUNTER — Other Ambulatory Visit: Payer: Self-pay | Admitting: Family Medicine

## 2019-01-19 NOTE — Telephone Encounter (Signed)
Pt needs a refill on Losartan 100 mg 30 day supply waiting on his mail order  [place]

## 2019-01-19 NOTE — Telephone Encounter (Signed)
Please review. Also needs 30 day supply sent into local pharmacy. Thanks!

## 2019-01-21 NOTE — Telephone Encounter (Signed)
Please clarify, does he need for BOTH mail order and local? Or just local?

## 2019-01-22 ENCOUNTER — Ambulatory Visit: Payer: Medicare Other

## 2019-01-23 ENCOUNTER — Encounter: Payer: Self-pay | Admitting: Occupational Therapy

## 2019-01-23 ENCOUNTER — Encounter: Payer: Self-pay | Admitting: Physical Therapy

## 2019-01-23 ENCOUNTER — Ambulatory Visit: Payer: Medicare Other | Admitting: Occupational Therapy

## 2019-01-23 ENCOUNTER — Other Ambulatory Visit: Payer: Self-pay

## 2019-01-23 ENCOUNTER — Ambulatory Visit: Payer: Medicare Other | Attending: Family Medicine | Admitting: Physical Therapy

## 2019-01-23 DIAGNOSIS — M6281 Muscle weakness (generalized): Secondary | ICD-10-CM | POA: Insufficient documentation

## 2019-01-23 DIAGNOSIS — R278 Other lack of coordination: Secondary | ICD-10-CM | POA: Diagnosis not present

## 2019-01-23 DIAGNOSIS — R531 Weakness: Secondary | ICD-10-CM | POA: Insufficient documentation

## 2019-01-23 DIAGNOSIS — R269 Unspecified abnormalities of gait and mobility: Secondary | ICD-10-CM | POA: Insufficient documentation

## 2019-01-23 DIAGNOSIS — R2681 Unsteadiness on feet: Secondary | ICD-10-CM | POA: Insufficient documentation

## 2019-01-23 NOTE — Telephone Encounter (Signed)
Spoke with patient and he states only for local. KW

## 2019-01-23 NOTE — Therapy (Signed)
[place] MAIN [facility] SERVICES [address] [place], [place], [address] Phone: [phone]   Fax:  [phone]  Occupational Therapy Treatment  Patient Details  Name: Todd Potter MRN: [id number] Date of Birth: [birth year] Referring Provider (OT): Dr. Caryn Section   Encounter Date: 01/23/2019  OT End of Session - 01/23/19 1033    Visit Number  12    Number of Visits  24    Date for OT Re-Evaluation  03/07/19    Authorization Type  Progress report period starting 01/17/2019    OT Start Time  1015    OT Stop Time  1100    OT Time Calculation (min)  45 min    Activity Tolerance  Patient tolerated treatment well    Behavior During Therapy  [facility] for tasks assessed/performed       Past Medical History:  Diagnosis Date  . Acanthosis nigricans   . CAD (coronary artery disease)   . Cardiomegaly   . Diabetes mellitus without complication ([place])   . History of adenomatous polyp of colon   . Hyperlipidemia   . Hypertension   . Knee pain   . Myocardial infarct [facility])     Past Surgical History:  Procedure Laterality Date  . CARDIAC CATHETERIZATION     Stent Placement x 5 to RCA   . COLONOSCOPY WITH PROPOFOL N/A 08/06/2016   Procedure: COLONOSCOPY WITH PROPOFOL;  Surgeon: Manya Silvas, MD;  Location: [facility] ENDOSCOPY;  Service: Endoscopy;  Laterality: N/A;  . CYSTECTOMY  1970&1990   x2 on tail bone    There were no vitals filed for this visit.  Subjective Assessment - 01/23/19 1032    Subjective   Pt. reports feeling good today    Patient is accompanied by:  Family member    Pertinent History  Pt. is a 68 y.o. male who was diagnosed with a CVA with Left sided weakness. Pt. resides with his wife, and was previously independent with ADLs, and IADLs prior to onset of the CVA. 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 Pt. enjoying riding motorcycles with his wife.       OT TREATMENT    Neuro muscular re-education:  Pt. worked  on left hand [facility] skills grasping1/8" small pegs with long nosed tweezers, and placing them onto an extra small pegboard.    Therapeutic Exercise:  Pt. worked on the [employer] for 8 min. With constant monitoring of the BUEs. Pt. worked on changing, and alternating forward reverse position every 2 min. Pt. worked on level 4.5.Pt. performed gross gripping with grip strengthener. Pt. worked on sustaining grip while grasping pegs and reaching at various heights. The gripper wasset at 23.4# of grip strength force.  Response to Treatment   Pt. presents with increased compensation proximally with hiking the left shoulder, and leaning to the right with his trunk when performing [facility] tasks with his LUE. Pt. continues to work on improving LUE strength, and coordination in preparation for improved functional hand use during ADLs, and IADL tasks.                   OT Education - 01/23/19 1033    Education Details  [place], LUE strength    Person(s) Educated  Patient    Methods  Explanation    Comprehension  Verbalized understanding;Returned demonstration          OT Long Term Goals - 01/15/19 1042      OT LONG  TERM GOAL #1   Title  Pt. will improve grip strength by 10# to be able to sustain a firm hold on a motorcycle clutch.    Baseline  01/15/2019: Pt. is improving with legft hand grip, and is now able to a motorcycle clutch, however susatined firm gripping in limited.    Time  12    Period  Weeks    Status  On-going    Target Date  03/07/19      OT LONG TERM GOAL #2   Title  Pt. will improve lateral pinch strength by 3# to be able to hold a plate steady.    Baseline  01/15/2019: Pt. is able to hold a plate straight without tipping it with his left hand.    Time  12    Period  Weeks    Status  On-going    Target Date  03/07/19      OT LONG TERM GOAL #3   Title  Pt. will independently hike the left side of his pants.    Baseline  01/15/2019: Pt. wis improving with hiking his pants  with his left hand    Time  12    Period  Weeks    Status  On-going    Target Date  03/07/19      OT LONG TERM GOAL #4   Title  Pt. will independently wash his RUE using his LUE.    Baseline  01/15/2019: Limited grip on the bathclooth    Time  12    Period  Weeks    Status  On-going    Target Date  03/07/19      OT LONG TERM GOAL #5   Title  Pt. will improvie left hand [facility] skills  to be able to tie his shoes independently    Baseline  01/15/2019: Pt. has improved with tying his shoes , however it takes increased time to complete.    Time  12    Period  Weeks    Status  New    Target Date  03/07/19            Plan - 01/23/19 1035    Clinical Impression Statement  Pt. presents with increased compensation proximally with hiking the left shoulder, and leaning to the right with his trunk when performing [facility] tasks with his LUE. Pt. continues to work on improving LUE strength, and coordination in preparation for improved functional hand use during ADLs, and IADL tasks.    OT Occupational Profile and History  Detailed Assessment- Review of Records and additional review of physical, cognitive, psychosocial history related to current functional performance    Occupational performance deficits (Please refer to evaluation for details):  ADL's;IADL's    Body Structure / Function / Physical Skills  ADL;IADL;FMC;ROM;UE functional use;Strength    Rehab Potential  Good    Clinical Decision Making  Several treatment options, min-mod task modification necessary    Comorbidities Affecting Occupational Performance:  May have comorbidities impacting occupational performance    Modification or Assistance to Complete Evaluation   Min-Moderate modification of tasks or assist with assess necessary to complete eval    OT Frequency  2x / week    OT Duration  12 weeks    OT Treatment/Interventions  Self-care/ADL training;Therapeutic activities;Therapeutic exercise;Patient/family education;DME and/or AE  instruction;Neuromuscular education    Consulted and Agree with Plan of Care  Patient;Family member/caregiver    Family Member Consulted  Wife       Patient  will benefit from skilled therapeutic intervention in order to improve the following deficits and impairments:   Body Structure / Function / Physical Skills: ADL, IADL, FMC, ROM, UE functional use, Strength       Visit Diagnosis: Muscle weakness (generalized)  Other lack of coordination    Problem List Patient Active Problem List   Diagnosis Date Noted  . CVA (cerebrovascular accident) ([place]) 11/17/2018  . Morbid obesity ([place]) 02/07/2015  . Knee pain 02/06/2015  . History of adenomatous polyp of colon 02/06/2015  . Acanthosis nigricans 02/06/2015  . Hyperlipidemia, mixed 01/12/2011  . 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 Pure hypercholesterolemia 03/13/2007  . Essential hypertension 03/13/2007  . Personal history of tobacco use 02/08/2007  . Cardiac enlargement 10/18/2006  . Myocardial infarct, old 09/18/2006  . Controlled diabetes mellitus with renal manifestations ([place]) 09/18/2006  . CAD (coronary artery disease) 09/18/2006    Harrel Carina, MS, OTR/L 01/23/2019, 10:47 AM  [place] MAIN [facility] SERVICES [address] [place], [place], [address] Phone: [phone]   Fax:  [phone]  Name: Todd Potter MRN: [id number] Date of Birth: 28-Sep-1950

## 2019-01-23 NOTE — Therapy (Signed)
[place] MAIN [facility] SERVICES [address] [place], [place], [address] Phone: [phone]   Fax:  [phone]  Physical Therapy Treatment  Patient Details  Name: Todd Potter MRN: [id number] Date of Birth: 1951-01-07 Referring Provider (PT): [name] MD   Encounter Date: 01/23/2019  PT End of Session - 01/23/19 1002    Visit Number  14    Number of Visits  25    Date for PT Re-Evaluation  02/21/19    Authorization Type  initial eval: 11/29/18    PT Start Time  0930    PT Stop Time  1015    PT Time Calculation (min)  45 min    Activity Tolerance  Patient tolerated treatment well;No increased pain    Behavior During Therapy  WFL for tasks assessed/performed       Past Medical History:  Diagnosis Date  . Acanthosis nigricans   . CAD (coronary artery disease)   . Cardiomegaly   . Diabetes mellitus without complication ([place])   . History of adenomatous polyp of colon   . Hyperlipidemia   . Hypertension   . Knee pain   . Myocardial infarct [facility])     Past Surgical History:  Procedure Laterality Date  . CARDIAC CATHETERIZATION     Stent Placement x 5 to RCA   . COLONOSCOPY WITH PROPOFOL N/A 08/06/2016   Procedure: COLONOSCOPY WITH PROPOFOL;  Surgeon: Manya Silvas, MD;  Location: [facility] ENDOSCOPY;  Service: Endoscopy;  Laterality: N/A;  . CYSTECTOMY  1970&1990   x2 on tail bone    There were no vitals filed for this visit.  Subjective Assessment - 01/23/19 0931    Subjective  Patient doing well today, no reports of pain. Has not used cane in past week with walking at home.    Patient is accompained by:  Family member    Pertinent History  Pt reports getting up in the morning on 11/16/18 and upon waking noticed not having any strength in his left hand and his wife encouraged him to go to the hospital. He did not experience and numbness or tingling just weakness in LUE. He did not notice any changes in strength or sensation LLE.  The following day 11/17/18 he began to notice some toe drag of LLE and weakness. He called his doctor who encouraged him to go to the hospital in which he did. 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    Limitations  Walking    How long can you walk comfortably?  Pt walking his cauldesac for 30 minutes now, but he reports this is better than previously.    Patient Stated Goals  riding his motorcycle, fishing without difficulty, walking without difficulty    Currently in Pain?  No/denies    Pain Score  0-No pain    Pain Onset  More than a month ago      Neuro Re-ed: Patient requires CGA during all standing interventions due to limited stability and patient fear of LOB. Cueing for reduction of UE support required as well as postural alignment for optimal stability within COM   NMRE:  -Standing on airex foam with RLE on step with trunk rotation 1x20, 3lbs rod   -Standing on airex foam with RLE toe taps to step and dynadisk laterally 1x20   Semi-Tandem stance on firm surface, self ball toss 1x20 with RLE in front. CGA required for safety; He requires min A to attain position and  exhibits increased difficulty with RLE ahead of LLE due to instability with increased LLE weight bearing;   Standing on 1/2 foam tandem stance 2x30 sec; patient reach for rail assist >10 times, improved as time went on.  Standing on 1/2 foam, self ball toss x5; frequent LOB laterally, improved with min VC to go slowly and maintain balance before tossing ball.                -Star kick outs on firm surface to challenge SLS 1x5 reps each foot in center with Min A to [place] for safety; Patient required min VCs for proper positioning and exercise technique including to avoid excessive ROM for better stance control;  Ther-ex:  Leg press: LLE, 105# 2x10 displaying good carryover with slow and controlled motion and avoiding genu recurvatum; Patient c/o right knee stiffness following 10 reps, did not continue.     Seated LAQ LLE 5# 1x20 and 5 sec hold BLE with cues to keep ankle DF during ROM to facilitate improved knee control during gait tasks.    Stair step ups x20 alternating LE; reports of increased R knee stiffness following treatment.  In hallway:  Ambulate x320 feet without cane; cues to slow down step of RLE to increase LLE SLS time. Some left foot drag during second lap in hall from dorsiflexion fatigue.   Pt educated throughout session about proper posture and technique with exercises. Improved exercise technique, movement at target joints, use of target muscles after min to mod verbal, visual, tactile cues.   Patient demonstrates good motivation throughout today's session. Patient was able to perform static balance exercises with less frequent LOB, required min VC to slow down movement. Patient continues to be challenged by maintaining SLS on LLE, including with gait, which improved with cues to slow down steps and with repetition.       PT Education - 01/23/19 1001    Education Details  HEP    Person(s) Educated  Patient    Methods  Explanation;Demonstration;Verbal cues    Comprehension  Verbalized understanding;Returned demonstration;Verbal cues required;Need further instruction       PT Short Term Goals - 01/08/19 1023      PT SHORT TERM GOAL #1   Title  Pt will be independent with HEP in order to improve strength and balance in order to decrease fall risk and improve function at home and work.    Time  6    Period  Weeks    Status  Achieved    Target Date  01/10/19        PT Long Term Goals - 01/08/19 1023      PT LONG TERM GOAL #1   Title  Pt will improve BERG by at least 3 points in order to demonstrate clinically significant improvement in balance.    Baseline  11/29/18: 50/56    Time  12    Period  Weeks    Status  On-going      PT LONG TERM GOAL #2   Title  Pt will decrease 5TSTS by at least 3 seconds in order to demonstrate clinically significant  improvement in LE strength.    Baseline  11/29/18: 13.66s; 9/21 11.47sec    Time  12    Period  Weeks    Status  On-going    Target Date  02/21/19      PT LONG TERM GOAL #3   Title  Pt will decrease TUG to below 10 seconds/decrease in order to  demonstrate decreased fall risk.    Baseline  11/29/18: 14.96s; on 01/08/19: 10.45s (no AD)    Time  12    Period  Weeks    Status  Revised    Target Date  02/21/19      PT LONG TERM GOAL #4   Title  Patient will improve walking speed to >1.0 m/s in order to increase safety for community ambulation AEB 10MWT    Baseline  11/29/18: Self-selected: 16.5s =  0.61 m/s; Fastest: 11.4s = 0.88 m/s; 01/08/19: 1.24m/s    Time  12    Status  Achieved            Plan - 01/23/19 1028    Clinical Impression Statement  Patient presents with good motivation for today's session. He demonstrated good carryover from previous sessions in regards to qualitative aspects of therapeutic exercises. He continues to be challenged by single leg stance on left leg during static and dynamic activities, which improves with repetition and cues to slow down. Reports increase of fatigue following today's session, no pain this date. He would continue to benefit from skilled PT to addrsss the deficits outlined in this note.    Comorbidities  CAD, HTN, DM, hyperlipidemia    Examination-Activity Limitations  Squat;Locomotion Level;Stand    Examination-Participation Restrictions  Driving;Yard Work;Other    Rehab Potential  Good    PT Frequency  2x / week    PT Duration  12 weeks    PT Treatment/Interventions  ADLs/Self Care Home Management;Aquatic Therapy;Biofeedback;Canalith Repostioning;Cryotherapy;Electrical Stimulation;Ultrasound;Moist Heat;Iontophoresis 4mg /ml Dexamethasone;Gait training;Stair training;Functional mobility training;Therapeutic activities;Therapeutic exercise;Balance training;Patient/family education;Neuromuscular re-education;Manual techniques;Joint  Manipulations;Spinal Manipulations;Vestibular;Traction;DME Instruction;Cognitive remediation;Orthotic Fit/Training;Passive range of motion;Dry needling;Splinting    PT Next Visit Plan  Continue strengthening and balance exercises, utilize NMES for L ankle strength as appropriate    PT Home Exercise Plan  Access Code: [email or web address]    Consulted and Agree with Plan of Care  Patient       Patient will benefit from skilled therapeutic intervention in order to improve the following deficits and impairments:  Abnormal gait, Decreased coordination, Difficulty walking, Impaired UE functional use, Decreased balance, Decreased mobility, Decreased strength  Visit Diagnosis: Muscle weakness (generalized)  Other lack of coordination  Unsteadiness on feet  Abnormal gait     Problem List Patient Active Problem List   Diagnosis Date Noted  . CVA (cerebrovascular accident) ([place]) 11/17/2018  . Morbid obesity ([place]) 02/07/2015  . Knee pain 02/06/2015  . History of adenomatous polyp of colon 02/06/2015  . Acanthosis nigricans 02/06/2015  . Hyperlipidemia, mixed 01/12/2011  . 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 Pure hypercholesterolemia 03/13/2007  . Essential hypertension 03/13/2007  . Personal history of tobacco use 02/08/2007  . Cardiac enlargement 10/18/2006  . Myocardial infarct, old 09/18/2006  . Controlled diabetes mellitus with renal manifestations ([place]) 09/18/2006  . CAD (coronary artery disease) 09/18/2006   Jeneen Rinks A. Rosana Hoes, SPT This entire session was performed under direct supervision and direction of a licensed therapist/therapist assistant . I have personally read, edited and approve of the note as written.  Trotter,Margaret PT, DPT 01/23/2019, 11:53 AM  [place] MAIN [facility] SERVICES [address] [place], [place], [address] Phone: [phone]   Fax:  [phone]  Name: AMAHRI BONGO MRN: [id number] Date of Birth: 1951-02-06

## 2019-01-24 ENCOUNTER — Ambulatory Visit: Payer: Medicare Other

## 2019-01-24 MED ORDER — LOSARTAN POTASSIUM 100 MG PO TABS: 100.0000 mg | ORAL_TABLET | Freq: Every day | ORAL | 0 refills | Status: DC

## 2019-01-25 ENCOUNTER — Ambulatory Visit: Payer: Medicare Other | Admitting: Physical Therapy

## 2019-01-25 ENCOUNTER — Encounter: Payer: Self-pay | Admitting: Physical Therapy

## 2019-01-25 ENCOUNTER — Ambulatory Visit: Payer: Medicare Other | Admitting: Occupational Therapy

## 2019-01-25 ENCOUNTER — Other Ambulatory Visit: Payer: Self-pay

## 2019-01-25 ENCOUNTER — Encounter: Payer: Self-pay | Admitting: Occupational Therapy

## 2019-01-25 DIAGNOSIS — R269 Unspecified abnormalities of gait and mobility: Secondary | ICD-10-CM

## 2019-01-25 DIAGNOSIS — R278 Other lack of coordination: Secondary | ICD-10-CM

## 2019-01-25 DIAGNOSIS — M6281 Muscle weakness (generalized): Secondary | ICD-10-CM | POA: Diagnosis not present

## 2019-01-25 DIAGNOSIS — R2681 Unsteadiness on feet: Secondary | ICD-10-CM | POA: Diagnosis not present

## 2019-01-25 DIAGNOSIS — R531 Weakness: Secondary | ICD-10-CM | POA: Diagnosis not present

## 2019-01-25 NOTE — Therapy (Signed)
[place] MAIN [facility] SERVICES [address] [place], [place], [address] Phone: [phone]   Fax:  [phone]  Physical Therapy Treatment  Patient Details  Name: Todd Potter MRN: [id number] Date of Birth: 30-May-1950 Referring Provider (PT): [name] MD   Encounter Date: 01/25/2019  PT End of Session - 01/25/19 1129    Visit Number  16    Number of Visits  25    Date for PT Re-Evaluation  02/21/19    Authorization Type  initial eval: 11/29/18    Equipment Utilized During Treatment  Gait belt    Activity Tolerance  Patient tolerated treatment well;No increased pain    Behavior During Therapy  WFL for tasks assessed/performed       Past Medical History:  Diagnosis Date  . Acanthosis nigricans   . CAD (coronary artery disease)   . Cardiomegaly   . Diabetes mellitus without complication ([place])   . History of adenomatous polyp of colon   . Hyperlipidemia   . Hypertension   . Knee pain   . Myocardial infarct [facility])     Past Surgical History:  Procedure Laterality Date  . CARDIAC CATHETERIZATION     Stent Placement x 5 to RCA   . COLONOSCOPY WITH PROPOFOL N/A 08/06/2016   Procedure: COLONOSCOPY WITH PROPOFOL;  Surgeon: Manya Silvas, MD;  Location: [facility] ENDOSCOPY;  Service: Endoscopy;  Laterality: N/A;  . CYSTECTOMY  1970&1990   x2 on tail bone    There were no vitals filed for this visit.  Subjective Assessment - 01/25/19 1129    Subjective  Patient doing well today, no reports of pain. Has not used cane in past week with walking at home.    Patient is accompained by:  Family member    Pertinent History  Pt reports getting up in the morning on 11/16/18 and upon waking noticed not having any strength in his left hand and his wife encouraged him to go to the hospital. He did not experience and numbness or tingling just weakness in LUE. He did not notice any changes in strength or sensation LLE. The following day 11/17/18 he began  to notice some toe drag of LLE and weakness. He called his doctor who encouraged him to go to the hospital in which he did. 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    Limitations  Walking    How long can you walk comfortably?  Pt walking his cauldesac for 30 minutes now, but he reports this is better than previously.    Patient Stated Goals  riding his motorcycle, fishing without difficulty, walking without difficulty    Currently in Pain?  No/denies    Pain Score  0-No pain    Pain Onset  More than a month ago       Neuromuscular training :  Standing on airex foam with RLE on step with trunk rotation 1x20, 3lbs rod , cues for head to follow UE  Standing on airex foam with RLE toe taps to step  1x20  Semi-Tandem stance on firm surface, trunk rotation,  1x20 with RLE in front. CGA required for safety  Standing on 1/2 foam tandem stance 2x30 sec; patient reach for rail assist 50% , patient improved with practice  Standing on 1/2 foam, self ball toss x5; frequent LOB laterally, improved with min VC to go slowly   Star taps on firm surface to challenge SLS 1x5reps each foot in center with  Min A to CGA for safety  Matrix x 22. 5 x 3 reps fwd/bwd, side to side . , cues for correct posture  Ther-ex:  Leg press: LLE, 105#2x10 displaying good carryover with slow and controlled motion and avoiding genu recurvatum; Patient c/o right knee stiffness following 10 reps, did not continue.  Standing marching 1x20  BLE , cues to perform slowly    Pt educated  about  posture and technique with exercises to improve exercise technique, movement at target joints, with min  verbal, visual, tactile cues.                     PT Education - 01/25/19 1129    Education Details  HEP    Person(s) Educated  Patient    Methods  Explanation;Demonstration    Comprehension  Verbalized understanding;Returned demonstration;Need further instruction        PT Short Term Goals - 01/08/19 1023      PT SHORT TERM GOAL #1   Title  Pt will be independent with HEP in order to improve strength and balance in order to decrease fall risk and improve function at home and work.    Time  6    Period  Weeks    Status  Achieved    Target Date  01/10/19        PT Long Term Goals - 01/08/19 1023      PT LONG TERM GOAL #1   Title  Pt will improve BERG by at least 3 points in order to demonstrate clinically significant improvement in balance.    Baseline  11/29/18: 50/56    Time  12    Period  Weeks    Status  On-going      PT LONG TERM GOAL #2   Title  Pt will decrease 5TSTS by at least 3 seconds in order to demonstrate clinically significant improvement in LE strength.    Baseline  11/29/18: 13.66s; 9/21 11.47sec    Time  12    Period  Weeks    Status  On-going    Target Date  02/21/19      PT LONG TERM GOAL #3   Title  Pt will decrease TUG to below 10 seconds/decrease in order to demonstrate decreased fall risk.    Baseline  11/29/18: 14.96s; on 01/08/19: 10.45s (no AD)    Time  12    Period  Weeks    Status  Revised    Target Date  02/21/19      PT LONG TERM GOAL #4   Title  Patient will improve walking speed to >1.0 m/s in order to increase safety for community ambulation AEB 10MWT    Baseline  11/29/18: Self-selected: 16.5s =  0.61 m/s; Fastest: 11.4s = 0.88 m/s; 01/08/19: 1.36m/s    Time  12    Status  Achieved            Plan - 01/25/19 1130    Clinical Impression Statement  Patient demonstrates deficits with postural control in tandem and narrow stance on purple foam with trunk rotation.  Patient demonstrated decreased stability with full weight shifting during lunge but minimal cueing resulted in good technique and no LOB.  Patient improved ability to challenge dynamic balance with supervision and without UE assist today.  Patient shows good BLE strength and hip control during matrix balance exercise.  Patient will continue  to benefit from skilled physical therapy to improve endurance and dynamic balance to  reduce fall risk.    Comorbidities  CAD, HTN, DM, hyperlipidemia    Examination-Activity Limitations  Squat;Locomotion Level;Stand    Examination-Participation Restrictions  Driving;Yard Work;Other    Rehab Potential  Good    PT Frequency  2x / week    PT Duration  12 weeks    PT Treatment/Interventions  ADLs/Self Care Home Management;Aquatic Therapy;Biofeedback;Canalith Repostioning;Cryotherapy;Electrical Stimulation;Ultrasound;Moist Heat;Iontophoresis 4mg /ml Dexamethasone;Gait training;Stair training;Functional mobility training;Therapeutic activities;Therapeutic exercise;Balance training;Patient/family education;Neuromuscular re-education;Manual techniques;Joint Manipulations;Spinal Manipulations;Vestibular;Traction;DME Instruction;Cognitive remediation;Orthotic Fit/Training;Passive range of motion;Dry needling;Splinting    PT Next Visit Plan  Continue strengthening and balance exercises, utilize NMES for L ankle strength as appropriate    PT Home Exercise Plan  Access Code: [email or web address]    Consulted and Agree with Plan of Care  Patient       Patient will benefit from skilled therapeutic intervention in order to improve the following deficits and impairments:  Abnormal gait, Decreased coordination, Difficulty walking, Impaired UE functional use, Decreased balance, Decreased mobility, Decreased strength  Visit Diagnosis: Muscle weakness (generalized)  Other lack of coordination  Abnormal gait  Unsteadiness on feet     Problem List Patient Active Problem List   Diagnosis Date Noted  . CVA (cerebrovascular accident) ([place]) 11/17/2018  . Morbid obesity ([place]) 02/07/2015  . Knee pain 02/06/2015  . History of adenomatous polyp of colon 02/06/2015  . Acanthosis nigricans 02/06/2015  . Hyperlipidemia, mixed 01/12/2011  . 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 Pure hypercholesterolemia 03/13/2007  .  Essential hypertension 03/13/2007  . Personal history of tobacco use 02/08/2007  . Cardiac enlargement 10/18/2006  . Myocardial infarct, old 09/18/2006  . Controlled diabetes mellitus with renal manifestations ([place]) 09/18/2006  . CAD (coronary artery disease) 09/18/2006   ,  Alanson Puls, PT DPT 01/25/2019, 12:00 PM  [place] MAIN [facility] SERVICES [address] [place], [place], [address] Phone: [phone]   Fax:  [phone]  Name: MAMADY ARTZER MRN: [id number] Date of Birth: 25-Sep-1950

## 2019-01-26 NOTE — Therapy (Signed)
[place] MAIN [facility] SERVICES [address] [place], [place], [address] Phone: [phone]   Fax:  [phone]  Occupational Therapy Treatment  Patient Details  Name: Todd Potter MRN: [id number] Date of Birth: 07/19/1950 Referring Provider (OT): Dr. Caryn Section   Encounter Date: 01/25/2019  OT End of Session - 01/26/19 1013    Visit Number  13    Number of Visits  24    Date for OT Re-Evaluation  03/07/19    Authorization Type  Progress report period starting 01/17/2019    OT Start Time  0930    OT Stop Time  1015    OT Time Calculation (min)  45 min    Activity Tolerance  Patient tolerated treatment well    Behavior During Therapy  [facility] for tasks assessed/performed       Past Medical History:  Diagnosis Date  . Acanthosis nigricans   . CAD (coronary artery disease)   . Cardiomegaly   . Diabetes mellitus without complication ([place])   . History of adenomatous polyp of colon   . Hyperlipidemia   . Hypertension   . Knee pain   . Myocardial infarct [facility])     Past Surgical History:  Procedure Laterality Date  . CARDIAC CATHETERIZATION     Stent Placement x 5 to RCA   . COLONOSCOPY WITH PROPOFOL N/A 08/06/2016   Procedure: COLONOSCOPY WITH PROPOFOL;  Surgeon: Manya Silvas, MD;  Location: [facility] ENDOSCOPY;  Service: Endoscopy;  Laterality: N/A;  . CYSTECTOMY  1970&1990   x2 on tail bone    There were no vitals filed for this visit.  Subjective Assessment - 01/25/19 0936    Subjective   Patient reports he is doing well and feels he is gaining strength and coordination, tasks are becoming easier at home.    Pertinent History  Pt. is a 68 y.o. male who was diagnosed with a CVA with Left sided weakness. Pt. resides with his wife, and was previously independent with ADLs, and IADLs prior to onset of the CVA. 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 Pt. enjoying riding motorcycles with his wife.    Limitations  LUE  weakness, decreased motor control, and [facility] skills.    Patient Stated Goals  To regain use of his LUE    Currently in Pain?  No/denies    Pain Score  0-No pain       Neuro:  Patient seen for focus on Fine motor coordination with use of small pegs  inch in size, picking up from tabletop, use of translatory skills to move through the hand to the palm and then using hand for storage.  Returning pegs to fingertips and placing into container.  Patient's speed is Slow to complete at times and needs to continue to work on speed and dexterity. Patient instructed to place forearm on table to isolate fingers and not use shoulder, this method worked well for patient.  He still dropped items at times. Continued fine motor with Purdue pegboard to pick up and manipulate small pieces and to assemble 3 pc part.  Finger strengthening with use of push pins to place onto moderate resistive bulletin board with cues for prehension patterns.    Therex:  Finger strengthening with use of push pins to place onto moderate resistive bulletin board with cues for prehension patterns.      Response to tx:  Patient continues to make good progress in all areas and is  pleased with progress.  He continues to lack speed and dexterity with fine motor coordination tasks and would benefit from continued focus in this area.  He is able to pick up and manipulate items 1/2 inch and less in size but still drops items occasionally.  He continues to benefit from skilled OT to maximize safety and independence in necessary daily tasks.                OT Education - 01/26/19 1013    Education Details  [place], LUE strength    Person(s) Educated  Patient    Methods  Explanation    Comprehension  Verbalized understanding;Returned demonstration          OT Long Term Goals - 01/15/19 1042      OT LONG TERM GOAL #1   Title  Pt. will improve grip strength by 10# to be able to sustain a firm hold on a motorcycle clutch.     Baseline  01/15/2019: Pt. is improving with legft hand grip, and is now able to a motorcycle clutch, however susatined firm gripping in limited.    Time  12    Period  Weeks    Status  On-going    Target Date  03/07/19      OT LONG TERM GOAL #2   Title  Pt. will improve lateral pinch strength by 3# to be able to hold a plate steady.    Baseline  01/15/2019: Pt. is able to hold a plate straight without tipping it with his left hand.    Time  12    Period  Weeks    Status  On-going    Target Date  03/07/19      OT LONG TERM GOAL #3   Title  Pt. will independently hike the left side of his pants.    Baseline  01/15/2019: Pt. wis improving with hiking his pants with his left hand    Time  12    Period  Weeks    Status  On-going    Target Date  03/07/19      OT LONG TERM GOAL #4   Title  Pt. will independently wash his RUE using his LUE.    Baseline  01/15/2019: Limited grip on the bathclooth    Time  12    Period  Weeks    Status  On-going    Target Date  03/07/19      OT LONG TERM GOAL #5   Title  Pt. will improvie left hand [facility] skills  to be able to tie his shoes independently    Baseline  01/15/2019: Pt. has improved with tying his shoes , however it takes increased time to complete.    Time  12    Period  Weeks    Status  New    Target Date  03/07/19            Plan - 01/26/19 1014    Clinical Impression Statement  Patient continues to make good progress in all areas and is pleased with progress.  He continues to lack speed and dexterity with fine motor coordination tasks and would benefit from continued focus in this area.  He is able to pick up and manipulate items 1/2 inch and less in size but still drops items occasionally.  He continues to benefit from skilled OT to maximize safety and independence in necessary daily tasks.    OT Occupational Profile and History  Detailed Assessment- Review  of Records and additional review of physical, cognitive, psychosocial history  related to current functional performance    Occupational performance deficits (Please refer to evaluation for details):  ADL's;IADL's    Body Structure / Function / Physical Skills  ADL;IADL;FMC;ROM;UE functional use;Strength    Rehab Potential  Good    Clinical Decision Making  Several treatment options, min-mod task modification necessary    Comorbidities Affecting Occupational Performance:  May have comorbidities impacting occupational performance    Modification or Assistance to Complete Evaluation   Min-Moderate modification of tasks or assist with assess necessary to complete eval    OT Frequency  2x / week    OT Duration  12 weeks    OT Treatment/Interventions  Self-care/ADL training;Therapeutic activities;Therapeutic exercise;Patient/family education;DME and/or AE instruction;Neuromuscular education    Consulted and Agree with Plan of Care  Patient       Patient will benefit from skilled therapeutic intervention in order to improve the following deficits and impairments:   Body Structure / Function / Physical Skills: ADL, IADL, FMC, ROM, UE functional use, Strength       Visit Diagnosis: Other lack of coordination  Muscle weakness (generalized)    Problem List Patient Active Problem List   Diagnosis Date Noted  . CVA (cerebrovascular accident) ([place]) 11/17/2018  . Morbid obesity ([place]) 02/07/2015  . Knee pain 02/06/2015  . History of adenomatous polyp of colon 02/06/2015  . Acanthosis nigricans 02/06/2015  . Hyperlipidemia, mixed 01/12/2011  . 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 Pure hypercholesterolemia 03/13/2007  . Essential hypertension 03/13/2007  . Personal history of tobacco use 02/08/2007  . Cardiac enlargement 10/18/2006  . Myocardial infarct, old 09/18/2006  . Controlled diabetes mellitus with renal manifestations ([place]) 09/18/2006  . CAD (coronary artery disease) 09/18/2006   Megan Hayduk T Tomasita Morrow, OTR/L, CLT  Kynzi Levay 01/26/2019, 10:21 AM  [place] MAIN [facility] SERVICES [address] [place], [place], [address] Phone: [phone]   Fax:  [phone]  Name: Todd Potter MRN: [id number] Date of Birth: 03-07-1951

## 2019-01-29 ENCOUNTER — Other Ambulatory Visit: Payer: Self-pay

## 2019-01-29 ENCOUNTER — Encounter: Payer: Self-pay | Admitting: Occupational Therapy

## 2019-01-29 ENCOUNTER — Ambulatory Visit: Payer: Medicare Other | Admitting: Occupational Therapy

## 2019-01-29 ENCOUNTER — Encounter: Payer: Self-pay | Admitting: Physical Therapy

## 2019-01-29 ENCOUNTER — Ambulatory Visit: Payer: Medicare Other | Admitting: Physical Therapy

## 2019-01-29 DIAGNOSIS — R531 Weakness: Secondary | ICD-10-CM | POA: Diagnosis not present

## 2019-01-29 DIAGNOSIS — M6281 Muscle weakness (generalized): Secondary | ICD-10-CM | POA: Diagnosis not present

## 2019-01-29 DIAGNOSIS — R269 Unspecified abnormalities of gait and mobility: Secondary | ICD-10-CM | POA: Diagnosis not present

## 2019-01-29 DIAGNOSIS — R278 Other lack of coordination: Secondary | ICD-10-CM

## 2019-01-29 DIAGNOSIS — R2681 Unsteadiness on feet: Secondary | ICD-10-CM

## 2019-01-29 NOTE — Therapy (Addendum)
[place] MAIN [facility] SERVICES [address] [place], [place], [address] Phone: [phone]   Fax:  [phone]  Occupational Therapy Treatment  Patient Details  Name: Todd Potter MRN: [id number] Date of Birth: 11-13-1950 Referring Provider (OT): Dr. Caryn Section   Encounter Date: 01/29/2019  OT End of Session - 01/29/19 1206    Visit Number  14    Number of Visits  24    Date for OT Re-Evaluation  03/07/19    Authorization Type  Progress report period starting 01/17/2019    OT Start Time  1145    OT Stop Time  1230    OT Time Calculation (min)  45 min    Activity Tolerance  Patient tolerated treatment well    Behavior During Therapy  [facility] for tasks assessed/performed       Past Medical History:  Diagnosis Date  . Acanthosis nigricans   . CAD (coronary artery disease)   . Cardiomegaly   . Diabetes mellitus without complication ([place])   . History of adenomatous polyp of colon   . Hyperlipidemia   . Hypertension   . Knee pain   . Myocardial infarct [facility])     Past Surgical History:  Procedure Laterality Date  . CARDIAC CATHETERIZATION     Stent Placement x 5 to RCA   . COLONOSCOPY WITH PROPOFOL N/A 08/06/2016   Procedure: COLONOSCOPY WITH PROPOFOL;  Surgeon: Manya Silvas, MD;  Location: [facility] ENDOSCOPY;  Service: Endoscopy;  Laterality: N/A;  . CYSTECTOMY  1970&1990   x2 on tail bone    There were no vitals filed for this visit.  Subjective Assessment - 01/29/19 1206    Subjective   Patient reports he is doing well and feels he is gaining strength and coordination, tasks are becoming easier at home.    Patient is accompanied by:  Family member    Pertinent History  Pt. is a 68 y.o. male who was diagnosed with a CVA with Left sided weakness. Pt. resides with his wife, and was previously independent with ADLs, and IADLs prior to onset of the CVA. 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    Limitations  LUE weakness, decreased motor control, and [facility] skills.    Patient Stated Goals  To regain use of his LUE    Currently in Pain?  No/denies      OT TREATMENT    Neuro muscular re-education:  Pt. worked on [facility] skills grasping 1" sticks, and 1/4" collars. Pt. worked on storing the objects in the palm, and translatory skills moving the items from the palm of the hand to the tip of the 2nd digit, and thumb. Pt. worked on removing the pegs using bilateral alternating hand patterns.  Therapeutic Exercise:  Pt. worked on the [employer] for 8 min. with constant monitoring of the BUEs. Pt. Worked on changing, and alternating forward reverse position every 2 min. Rest breaks were required. Pt. worked on level 4.0.  Response to Treatment  Pt. Presented with slight swelling in the left wrist, and digits. Pt. reports sleeping with his left hand underneath him. Pt. Education was porvided about edema contrast bath, and positioning in elevation when sleeping. Pt. has made progress overall. Pt. is now using his left hand more during ADLs, and IADL tasks at home.  Pt. continues to present with limited LUE strength, and [facility] skills and drops multiple objects when attempting translatory  movements of the left hand. Pt. continues to work on improvine left hand function in order to improve left functional hand use during ADLs, and IADL tasks.                     OT Education - 01/29/19 1206    Education Details  [place], LUE strength    Person(s) Educated  Patient    Methods  Explanation    Comprehension  Verbalized understanding;Returned demonstration          OT Long Term Goals - 01/15/19 1042      OT LONG TERM GOAL #1   Title  Pt. will improve grip strength by 10# to be able to sustain a firm hold on a motorcycle clutch.    Baseline  01/15/2019: Pt. is improving with legft hand grip, and is now able to a motorcycle clutch, however susatined firm  gripping in limited.    Time  12    Period  Weeks    Status  On-going    Target Date  03/07/19      OT LONG TERM GOAL #2   Title  Pt. will improve lateral pinch strength by 3# to be able to hold a plate steady.    Baseline  01/15/2019: Pt. is able to hold a plate straight without tipping it with his left hand.    Time  12    Period  Weeks    Status  On-going    Target Date  03/07/19      OT LONG TERM GOAL #3   Title  Pt. will independently hike the left side of his pants.    Baseline  01/15/2019: Pt. wis improving with hiking his pants with his left hand    Time  12    Period  Weeks    Status  On-going    Target Date  03/07/19      OT LONG TERM GOAL #4   Title  Pt. will independently wash his RUE using his LUE.    Baseline  01/15/2019: Limited grip on the bathclooth    Time  12    Period  Weeks    Status  On-going    Target Date  03/07/19      OT LONG TERM GOAL #5   Title  Pt. will improvie left hand [facility] skills  to be able to tie his shoes independently    Baseline  01/15/2019: Pt. has improved with tying his shoes , however it takes increased time to complete.    Time  12    Period  Weeks    Status  New    Target Date  03/07/19            Plan - 01/29/19 1208    Clinical Impression Statement  Pt. Presented with slight swelling in the left wrist, and digits. Pt. reports sleeping with his left hand underneath him. Pt. Education was porvided about edema contrast bath, and positioning in elevation when sleeping. Pt. has made progress overall. Pt. is now using his left hand more during ADLs, and IADL tasks at home.  Pt. continues to present with limited LUE strength, and [facility] skills and drops multiple objects when attempting translatory movements of the left hand. Pt. continues to work on improvine left hand function in order to improve left functional hand use during ADLs, and IADL tasks.     OT Occupational Profile and History  Detailed Assessment- Review of Records and  additional review of physical, cognitive, psychosocial history related to current functional performance    Occupational performance deficits (Please refer to evaluation for details):  ADL's;IADL's    Body Structure / Function / Physical Skills  ADL;IADL;FMC;ROM;UE functional use;Strength    Rehab Potential  Good    Clinical Decision Making  Several treatment options, min-mod task modification necessary    Comorbidities Affecting Occupational Performance:  May have comorbidities impacting occupational performance    Modification or Assistance to Complete Evaluation   Min-Moderate modification of tasks or assist with assess necessary to complete eval    OT Frequency  2x / week    OT Duration  12 weeks    OT Treatment/Interventions  Self-care/ADL training;Therapeutic activities;Therapeutic exercise;Patient/family education;DME and/or AE instruction;Neuromuscular education    Consulted and Agree with Plan of Care  Patient    Family Member Consulted  Wife       Patient will benefit from skilled therapeutic intervention in order to improve the following deficits and impairments:   Body Structure / Function / Physical Skills: ADL, IADL, FMC, ROM, UE functional use, Strength       Visit Diagnosis: Muscle weakness (generalized)  Other lack of coordination    Problem List Patient Active Problem List   Diagnosis Date Noted  . CVA (cerebrovascular accident) ([place]) 11/17/2018  . Morbid obesity ([place]) 02/07/2015  . Knee pain 02/06/2015  . History of adenomatous polyp of colon 02/06/2015  . Acanthosis nigricans 02/06/2015  . Hyperlipidemia, mixed 01/12/2011  . 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 Pure hypercholesterolemia 03/13/2007  . Essential hypertension 03/13/2007  . Personal history of tobacco use 02/08/2007  . Cardiac enlargement 10/18/2006  . Myocardial infarct, old 09/18/2006  . Controlled diabetes mellitus with renal manifestations ([place]) 09/18/2006  . CAD (coronary artery  disease) 09/18/2006    Harrel Carina, MS, OTR/L 01/29/2019, 12:27 PM  [place] MAIN [facility] SERVICES [address] [place], [place], [address] Phone: [phone]   Fax:  [phone]  Name: Todd Potter MRN: [id number] Date of Birth: 01-26-1951

## 2019-01-29 NOTE — Therapy (Signed)
[place] MAIN [facility] SERVICES [address] [place], [place], [address] Phone: [phone]   Fax:  [phone]  Physical Therapy Treatment  Patient Details  Name: Todd Potter MRN: [id number] Date of Birth: 1950/05/24 Referring Provider (PT): [name] MD   Encounter Date: 01/29/2019  PT End of Session - 01/29/19 1107    Visit Number  17    Number of Visits  25    Date for PT Re-Evaluation  02/21/19    Authorization Type  initial eval: 11/29/18    PT Start Time  1100    PT Stop Time  1140    PT Time Calculation (min)  40 min    Equipment Utilized During Treatment  Gait belt    Activity Tolerance  Patient tolerated treatment well;No increased pain    Behavior During Therapy  WFL for tasks assessed/performed       Past Medical History:  Diagnosis Date  . Acanthosis nigricans   . CAD (coronary artery disease)   . Cardiomegaly   . Diabetes mellitus without complication ([place])   . History of adenomatous polyp of colon   . Hyperlipidemia   . Hypertension   . Knee pain   . Myocardial infarct [facility])     Past Surgical History:  Procedure Laterality Date  . CARDIAC CATHETERIZATION     Stent Placement x 5 to RCA   . COLONOSCOPY WITH PROPOFOL N/A 08/06/2016   Procedure: COLONOSCOPY WITH PROPOFOL;  Surgeon: Manya Silvas, MD;  Location: [facility] ENDOSCOPY;  Service: Endoscopy;  Laterality: N/A;  . CYSTECTOMY  1970&1990   x2 on tail bone    There were no vitals filed for this visit.  Subjective Assessment - 01/29/19 1106    Subjective  Patient doing well today, no reports of pain. Has not used cane in past week with walking at home.    Patient is accompained by:  Family member    Pertinent History  Pt reports getting up in the morning on 11/16/18 and upon waking noticed not having any strength in his left hand and his wife encouraged him to go to the hospital. He did not experience and numbness or tingling just weakness in LUE. He did  not notice any changes in strength or sensation LLE. The following day 11/17/18 he began to notice some toe drag of LLE and weakness. He called his doctor who encouraged him to go to the hospital in which he did. 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    Limitations  Walking    How long can you walk comfortably?  Pt walking his cauldesac for 30 minutes now, but he reports this is better than previously.    Patient Stated Goals  riding his motorcycle, fishing without difficulty, walking without difficulty    Currently in Pain?  No/denies    Pain Score  0-No pain    Pain Onset  More than a month ago       Treatment: Warm up on octane fitness x 5 mins L 4   Neuromuscular training: Standing tandem on 1/2 foam flat side up  x 2 mins  Standing tandem on 1/2 foam flat side down x 2 mins and head turns x 2 mins  Matirx fwd/bwd, side to side with 22 . 5 lbs and CGA   Rockerboard Lateral weight shift x10 reps each direction, no UE support, CGA for safety with VCs to control the board tap in each direction and  not letting it hit too hard  AP weight shift x10 reps, no UE support, CGA for safety with VCs to utilize ankles to transfer weight over toes and back over heels without just leaning the trunk Airex pad, balloon tapping  x2 min, supervision for safety with varying directions and speed of balloon,    Pt educated throughout session about proper posture and technique with exercises. Improved exercise technique, movement at target joints, use of target muscles after min to mod verbal, visual, tactile cues.                     PT Education - 01/29/19 1106    Education Details  HEP    Person(s) Educated  Patient    Methods  Explanation;Demonstration;Tactile cues;Verbal cues    Comprehension  Verbalized understanding;Returned demonstration;Need further instruction       PT Short Term Goals - 01/08/19 1023      PT SHORT TERM GOAL #1   Title  Pt will be  independent with HEP in order to improve strength and balance in order to decrease fall risk and improve function at home and work.    Time  6    Period  Weeks    Status  Achieved    Target Date  01/10/19        PT Long Term Goals - 01/08/19 1023      PT LONG TERM GOAL #1   Title  Pt will improve BERG by at least 3 points in order to demonstrate clinically significant improvement in balance.    Baseline  11/29/18: 50/56    Time  12    Period  Weeks    Status  On-going      PT LONG TERM GOAL #2   Title  Pt will decrease 5TSTS by at least 3 seconds in order to demonstrate clinically significant improvement in LE strength.    Baseline  11/29/18: 13.66s; 9/21 11.47sec    Time  12    Period  Weeks    Status  On-going    Target Date  02/21/19      PT LONG TERM GOAL #3   Title  Pt will decrease TUG to below 10 seconds/decrease in order to demonstrate decreased fall risk.    Baseline  11/29/18: 14.96s; on 01/08/19: 10.45s (no AD)    Time  12    Period  Weeks    Status  Revised    Target Date  02/21/19      PT LONG TERM GOAL #4   Title  Patient will improve walking speed to >1.0 m/s in order to increase safety for community ambulation AEB 10MWT    Baseline  11/29/18: Self-selected: 16.5s =  0.61 m/s; Fastest: 11.4s = 0.88 m/s; 01/08/19: 1.12m/s    Time  12    Status  Achieved            Plan - 01/29/19 1107    Clinical Impression Statement  Patient has difficulty with advanced dynamic standing balance activities. He performs intermediate and advanced static and dynamic standing challenges with cues for safety. He will continue to benefit from skilled PT to improve mobility.    Comorbidities  CAD, HTN, DM, hyperlipidemia    Examination-Activity Limitations  Squat;Locomotion Level;Stand    Examination-Participation Restrictions  Driving;Yard Work;Other    Rehab Potential  Good    PT Frequency  2x / week    PT Duration  12 weeks    PT Treatment/Interventions  ADLs/Self Care Home  Management;Aquatic Therapy;Biofeedback;Canalith Repostioning;Cryotherapy;Electrical Stimulation;Ultrasound;Moist Heat;Iontophoresis 4mg /ml Dexamethasone;Gait training;Stair training;Functional mobility training;Therapeutic activities;Therapeutic exercise;Balance training;Patient/family education;Neuromuscular re-education;Manual techniques;Joint Manipulations;Spinal Manipulations;Vestibular;Traction;DME Instruction;Cognitive remediation;Orthotic Fit/Training;Passive range of motion;Dry needling;Splinting    PT Next Visit Plan  Continue strengthening and balance exercises, utilize NMES for L ankle strength as appropriate    PT Home Exercise Plan  Access Code: [email or web address]    Consulted and Agree with Plan of Care  Patient       Patient will benefit from skilled therapeutic intervention in order to improve the following deficits and impairments:  Abnormal gait, Decreased coordination, Difficulty walking, Impaired UE functional use, Decreased balance, Decreased mobility, Decreased strength  Visit Diagnosis: Muscle weakness (generalized)  Other lack of coordination  Abnormal gait  Unsteadiness on feet  Acute left-sided weakness     Problem List Patient Active Problem List   Diagnosis Date Noted  . CVA (cerebrovascular accident) ([place]) 11/17/2018  . Morbid obesity ([place]) 02/07/2015  . Knee pain 02/06/2015  . History of adenomatous polyp of colon 02/06/2015  . Acanthosis nigricans 02/06/2015  . Hyperlipidemia, mixed 01/12/2011  . 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 Pure hypercholesterolemia 03/13/2007  . Essential hypertension 03/13/2007  . Personal history of tobacco use 02/08/2007  . Cardiac enlargement 10/18/2006  . Myocardial infarct, old 09/18/2006  . Controlled diabetes mellitus with renal manifestations ([place]) 09/18/2006  . CAD (coronary artery disease) 09/18/2006    Alanson Puls, PT DPT 01/29/2019, 11:12 AM  [place] MAIN  [facility] SERVICES [address] [place], [place], [address] Phone: [phone]   Fax:  [phone]  Name: Todd Potter MRN: [id number] Date of Birth: [birth year]

## 2019-01-31 ENCOUNTER — Encounter: Payer: Self-pay | Admitting: Occupational Therapy

## 2019-01-31 ENCOUNTER — Ambulatory Visit: Payer: Self-pay

## 2019-01-31 ENCOUNTER — Ambulatory Visit: Payer: Medicare Other | Admitting: Occupational Therapy

## 2019-01-31 ENCOUNTER — Other Ambulatory Visit: Payer: Self-pay

## 2019-01-31 DIAGNOSIS — R269 Unspecified abnormalities of gait and mobility: Secondary | ICD-10-CM | POA: Diagnosis not present

## 2019-01-31 DIAGNOSIS — R278 Other lack of coordination: Secondary | ICD-10-CM | POA: Diagnosis not present

## 2019-01-31 DIAGNOSIS — R2681 Unsteadiness on feet: Secondary | ICD-10-CM | POA: Diagnosis not present

## 2019-01-31 DIAGNOSIS — M6281 Muscle weakness (generalized): Secondary | ICD-10-CM | POA: Diagnosis not present

## 2019-01-31 DIAGNOSIS — R531 Weakness: Secondary | ICD-10-CM | POA: Diagnosis not present

## 2019-01-31 NOTE — Therapy (Signed)
[place] MAIN [facility] SERVICES [address] [place], [place], [address] Phone: [phone]   Fax:  [phone]  Occupational Therapy Treatment  Patient Details  Name: Todd Potter MRN: [id number] Date of Birth: January 02, 1951 Referring Provider (OT): Dr. Caryn Section   Encounter Date: 01/31/2019  OT End of Session - 01/31/19 1121    Visit Number  15    Number of Visits  24    Date for OT Re-Evaluation  03/07/19    Authorization Type  Progress report period starting 01/17/2019    OT Start Time  1107    OT Stop Time  1150    OT Time Calculation (min)  43 min    Activity Tolerance  Patient tolerated treatment well    Behavior During Therapy  [facility] for tasks assessed/performed       Past Medical History:  Diagnosis Date  . Acanthosis nigricans   . CAD (coronary artery disease)   . Cardiomegaly   . Diabetes mellitus without complication ([place])   . History of adenomatous polyp of colon   . Hyperlipidemia   . Hypertension   . Knee pain   . Myocardial infarct [facility])     Past Surgical History:  Procedure Laterality Date  . CARDIAC CATHETERIZATION     Stent Placement x 5 to RCA   . COLONOSCOPY WITH PROPOFOL N/A 08/06/2016   Procedure: COLONOSCOPY WITH PROPOFOL;  Surgeon: Manya Silvas, MD;  Location: [facility] ENDOSCOPY;  Service: Endoscopy;  Laterality: N/A;  . CYSTECTOMY  1970&1990   x2 on tail bone    There were no vitals filed for this visit.  Subjective Assessment - 01/31/19 1118    Patient is accompanied by:  Family member    Pertinent History  Pt. is a 68 y.o. male who was diagnosed with a CVA with Left sided weakness. Pt. resides with his wife, and was previously independent with ADLs, and IADLs prior to onset of the CVA. 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 Pt. enjoying riding motorcycles with his wife.    Limitations  LUE weakness, decreased motor control, and [facility] skills.    Patient Stated Goals  To regain use of his  LUE    Currently in Pain?  No/denies      OT TREATMENT    Neuro muscular re-education:  Pt. worked on grasping coins from a tabletop surface, placing them into a resistive container, and pushing them through the slot while isolating his 2nd digit. Pt. worked translatory movements of the left hand, moving objects from his palm to the tip of his 2nd digit, and thumb. Pt. worked on [facility] skills grasping 1" sticks, 1/4" collars, and 1/4" washers. Pt. worked on storing the objects in the palm, and translatory skills moving the items from the palm of the hand to the tip of the 2nd digit, and thumb. Pt. worked on removing the pegs using bilateral alternating hand patterns.  Therapeutic Exercise:  Pt. performed gross gripping with grip strengthener. Pt. worked on sustaining grip while grasping pegs and reaching at various heights. The Gripper was set at 23.4# of force.  Response to Treatment:  Pt. reports that he usied his LUE to pull up his wife's flowers in her flowerbed, and filled up his wheel barrel. Pt. reports that he woke up last night with soreness in his left shoulder. Pt. continues to present with limited LUE [facility] skills, and translatory movements of the hand. Pt. presents with less compensation  proximally in his left shoulder, or leaning to the right during tasks today. Pt. continues to work on LUE strength, and [facility] skills in order to improve LUE functioning during ADLs, and IADLs.                      OT Education - 01/31/19 1120    Education Details  [place], LUE strength    Person(s) Educated  Patient    Methods  Explanation    Comprehension  Verbalized understanding;Returned demonstration          OT Long Term Goals - 01/15/19 1042      OT LONG TERM GOAL #1   Title  Pt. will improve grip strength by 10# to be able to sustain a firm hold on a motorcycle clutch.    Baseline  01/15/2019: Pt. is improving with legft hand grip, and is now able to a motorcycle clutch,  however susatined firm gripping in limited.    Time  12    Period  Weeks    Status  On-going    Target Date  03/07/19      OT LONG TERM GOAL #2   Title  Pt. will improve lateral pinch strength by 3# to be able to hold a plate steady.    Baseline  01/15/2019: Pt. is able to hold a plate straight without tipping it with his left hand.    Time  12    Period  Weeks    Status  On-going    Target Date  03/07/19      OT LONG TERM GOAL #3   Title  Pt. will independently hike the left side of his pants.    Baseline  01/15/2019: Pt. wis improving with hiking his pants with his left hand    Time  12    Period  Weeks    Status  On-going    Target Date  03/07/19      OT LONG TERM GOAL #4   Title  Pt. will independently wash his RUE using his LUE.    Baseline  01/15/2019: Limited grip on the bathclooth    Time  12    Period  Weeks    Status  On-going    Target Date  03/07/19      OT LONG TERM GOAL #5   Title  Pt. will improvie left hand [facility] skills  to be able to tie his shoes independently    Baseline  01/15/2019: Pt. has improved with tying his shoes , however it takes increased time to complete.    Time  12    Period  Weeks    Status  New    Target Date  03/07/19            Plan - 01/31/19 1121    Clinical Impression Statement Pt. reports that he usied his LUE to pull up his wife's flowers in her flowerbed, and filled up his wheel barrel. Pt. reports that he woke up last night with soreness in his left shoulder. Pt. continues to present with limited LUE [facility] skills, and translatory movements of the hand. Pt. presents with less compensation proximally in his left shoulder, or leaning to the right during tasks today. Pt. continues to work on LUE strength, and [facility] skills in order to improve LUE functioning during ADLs, and IADLs.    OT Occupational Profile and History  Detailed Assessment- Review of Records and additional review of physical, cognitive, psychosocial history  related to  current functional performance    Occupational performance deficits (Please refer to evaluation for details):  ADL's;IADL's    Body Structure / Function / Physical Skills  ADL;IADL;FMC;ROM;UE functional use;Strength    Rehab Potential  Good    Clinical Decision Making  Several treatment options, min-mod task modification necessary    Comorbidities Affecting Occupational Performance:  May have comorbidities impacting occupational performance    Modification or Assistance to Complete Evaluation   Min-Moderate modification of tasks or assist with assess necessary to complete eval    OT Frequency  2x / week    OT Duration  12 weeks    OT Treatment/Interventions  Self-care/ADL training;Therapeutic activities;Therapeutic exercise;Patient/family education;DME and/or AE instruction;Neuromuscular education    Consulted and Agree with Plan of Care  Patient    Family Member Consulted  Wife       Patient will benefit from skilled therapeutic intervention in order to improve the following deficits and impairments:   Body Structure / Function / Physical Skills: ADL, IADL, FMC, ROM, UE functional use, Strength       Visit Diagnosis: Muscle weakness (generalized)  Other lack of coordination    Problem List Patient Active Problem List   Diagnosis Date Noted  . CVA (cerebrovascular accident) ([place]) 11/17/2018  . Morbid obesity ([place]) 02/07/2015  . Knee pain 02/06/2015  . History of adenomatous polyp of colon 02/06/2015  . Acanthosis nigricans 02/06/2015  . Hyperlipidemia, mixed 01/12/2011  . 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 Pure hypercholesterolemia 03/13/2007  . Essential hypertension 03/13/2007  . Personal history of tobacco use 02/08/2007  . Cardiac enlargement 10/18/2006  . Myocardial infarct, old 09/18/2006  . Controlled diabetes mellitus with renal manifestations ([place]) 09/18/2006  . CAD (coronary artery disease) 09/18/2006    Harrel Carina, MS, OTR/L 01/31/2019, 11:44  AM  [place] MAIN [facility] SERVICES [address] [place], [place], [address] Phone: [phone]   Fax:  [phone]  Name: Todd Potter MRN: [id number] Date of Birth: [birth year]

## 2019-02-05 ENCOUNTER — Ambulatory Visit: Payer: Medicare Other | Admitting: Occupational Therapy

## 2019-02-05 ENCOUNTER — Other Ambulatory Visit: Payer: Self-pay

## 2019-02-05 ENCOUNTER — Ambulatory Visit: Payer: Medicare Other

## 2019-02-05 DIAGNOSIS — R2681 Unsteadiness on feet: Secondary | ICD-10-CM

## 2019-02-05 DIAGNOSIS — M6281 Muscle weakness (generalized): Secondary | ICD-10-CM

## 2019-02-05 DIAGNOSIS — R278 Other lack of coordination: Secondary | ICD-10-CM

## 2019-02-05 DIAGNOSIS — R269 Unspecified abnormalities of gait and mobility: Secondary | ICD-10-CM | POA: Diagnosis not present

## 2019-02-05 DIAGNOSIS — R531 Weakness: Secondary | ICD-10-CM | POA: Diagnosis not present

## 2019-02-05 NOTE — Therapy (Signed)
[place] MAIN [facility] SERVICES [address] [place], [place], [address] Phone: [phone]   Fax:  [phone]  Occupational Therapy Treatment  Patient Details  Name: Todd Potter MRN: [id number] Date of Birth: 27-Apr-1950 Referring Provider (OT): Dr. Caryn Section   Encounter Date: 02/05/2019  OT End of Session - 02/05/19 1027    Visit Number  16    Number of Visits  24    Date for OT Re-Evaluation  03/07/19    Authorization Type  Progress report period starting 01/17/2019    OT Start Time  1020    OT Stop Time  1100    OT Time Calculation (min)  40 min    Activity Tolerance  Patient tolerated treatment well    Behavior During Therapy  [facility] for tasks assessed/performed       Past Medical History:  Diagnosis Date  . Acanthosis nigricans   . CAD (coronary artery disease)   . Cardiomegaly   . Diabetes mellitus without complication ([place])   . History of adenomatous polyp of colon   . Hyperlipidemia   . Hypertension   . Knee pain   . Myocardial infarct [facility])     Past Surgical History:  Procedure Laterality Date  . CARDIAC CATHETERIZATION     Stent Placement x 5 to RCA   . COLONOSCOPY WITH PROPOFOL N/A 08/06/2016   Procedure: COLONOSCOPY WITH PROPOFOL;  Surgeon: Manya Silvas, MD;  Location: [facility] ENDOSCOPY;  Service: Endoscopy;  Laterality: N/A;  . CYSTECTOMY  1970&1990   x2 on tail bone    There were no vitals filed for this visit.  Subjective Assessment - 02/05/19 1025    Subjective   Patient reports he is doing well and feels he is gaining strength and coordination and is enjoying doing yard work again.    Patient is accompanied by:  Family member    Pertinent History  Pt. is a 68 y.o. male who was diagnosed with a CVA with Left sided weakness. Pt. resides with his wife, and was previously independent with ADLs, and IADLs prior to onset of the CVA. 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    Limitations  LUE weakness, decreased motor control, and [facility] skills.    Patient Stated Goals  To regain use of his LUE    Currently in Pain?  No/denies       OT TREATMENT    Neuro muscular re-education:  Pt. worked on grasping small plastic beads and used index finger and thumb to manipulate and push together for hand strengthening and control of isolated finger muscles.  Improved bilateral hand coordination skills noted this session and no substitution patterns noted. Pt worked translatory movements of the left hand, moving objects from his palm to the tip of his 2nd digit, and thumb. Pt. worked on [facility] skills grasping small 1/8" beads with long nosed tweezers with L hand with focus on keeping L shoulder depressed and not elevated with wrist extension and focus on isolated finger movements and control.  Improved after using R hand to learn how his LUE was compensating compared to R side movements. He worked on placing and moving small beads with min cues and extra time.   Response to Treatment:  Pt. reports that he is using his LUE to do more yard work and plans to put down mulch today with hopes that his LUE does not get painful  again. Pt. Has soreness in his L shoulder today but no pain.  Pt. continues to present with limited LUE [facility] skills, and translatory movements of the hand. Pt. presents with less compensation proximally in his left shoulder, or leaning to the right during tasks today. Pt. continues to work on LUE strength, and [facility] skills in order to improve LUE functioning during ADLs, and IADLs.                     OT Education - 02/05/19 1027    Education Details  [place], LUE strength    Person(s) Educated  Patient    Methods  Explanation    Comprehension  Verbalized understanding;Returned demonstration          OT Long Term Goals - 01/15/19 1042      OT LONG TERM GOAL #1   Title  Pt. will improve grip strength by 10# to be  able to sustain a firm hold on a motorcycle clutch.    Baseline  01/15/2019: Pt. is improving with legft hand grip, and is now able to a motorcycle clutch, however susatined firm gripping in limited.    Time  12    Period  Weeks    Status  On-going    Target Date  03/07/19      OT LONG TERM GOAL #2   Title  Pt. will improve lateral pinch strength by 3# to be able to hold a plate steady.    Baseline  01/15/2019: Pt. is able to hold a plate straight without tipping it with his left hand.    Time  12    Period  Weeks    Status  On-going    Target Date  03/07/19      OT LONG TERM GOAL #3   Title  Pt. will independently hike the left side of his pants.    Baseline  01/15/2019: Pt. wis improving with hiking his pants with his left hand    Time  12    Period  Weeks    Status  On-going    Target Date  03/07/19      OT LONG TERM GOAL #4   Title  Pt. will independently wash his RUE using his LUE.    Baseline  01/15/2019: Limited grip on the bathclooth    Time  12    Period  Weeks    Status  On-going    Target Date  03/07/19      OT LONG TERM GOAL #5   Title  Pt. will improvie left hand [facility] skills  to be able to tie his shoes independently    Baseline  01/15/2019: Pt. has improved with tying his shoes , however it takes increased time to complete.    Time  12    Period  Weeks    Status  New    Target Date  03/07/19            Plan - 02/05/19 1040    Clinical Impression Statement  Pt. reports that he ctontinues to usie his LUE to pull up his wife's flowers in her flowerbed, and put down some mulch later today. Pt. reports that he has L shoulder soreness but no pain today. Pt. continues to present with limited LUE [facility] skills, and translatory movements of the hand. Pt. presents with less compensation proximally in his left shoulder, or leaning to the right during tasks today. Pt. continues to work on LUE strength, and  [facility] skills in order to improve LUE functioning during ADLs, and IADLs.     OT Occupational Profile and History  Problem Focused Assessment - Including review of records relating to presenting problem    Occupational performance deficits (Please refer to evaluation for details):  ADL's;IADL's    Body Structure / Function / Physical Skills  ADL;IADL;FMC;ROM;UE functional use;Strength    Rehab Potential  Good    Clinical Decision Making  Several treatment options, min-mod task modification necessary    Comorbidities Affecting Occupational Performance:  May have comorbidities impacting occupational performance    Modification or Assistance to Complete Evaluation   Min-Moderate modification of tasks or assist with assess necessary to complete eval    OT Frequency  2x / week    OT Duration  12 weeks    OT Treatment/Interventions  Self-care/ADL training;Therapeutic activities;Therapeutic exercise;Patient/family education;DME and/or AE instruction;Neuromuscular education    Consulted and Agree with Plan of Care  Patient       Patient will benefit from skilled therapeutic intervention in order to improve the following deficits and impairments:   Body Structure / Function / Physical Skills: ADL, IADL, FMC, ROM, UE functional use, Strength       Visit Diagnosis: Muscle weakness (generalized)  Other lack of coordination    Problem List Patient Active Problem List   Diagnosis Date Noted  . CVA (cerebrovascular accident) ([place]) 11/17/2018  . Morbid obesity ([place]) 02/07/2015  . Knee pain 02/06/2015  . History of adenomatous polyp of colon 02/06/2015  . Acanthosis nigricans 02/06/2015  . Hyperlipidemia, mixed 01/12/2011  . 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 Pure hypercholesterolemia 03/13/2007  . Essential hypertension 03/13/2007  . Personal history of tobacco use 02/08/2007  . Cardiac enlargement 10/18/2006  . Myocardial infarct, old 09/18/2006  . Controlled diabetes mellitus with renal manifestations ([place]) 09/18/2006  . CAD (coronary artery disease)  09/18/2006    Chrys Racer, OTR/L, [facility] ascom [phone] 02/05/19, 11:01 AM  [place] [address] [place], [place], [address] Phone: [phone]   Fax:  [phone]  Name: Todd Potter MRN: [id number] Date of Birth: 09/15/1950

## 2019-02-05 NOTE — Therapy (Signed)
[place] MAIN [facility] SERVICES [address] [place], [place], [address] Phone: [phone]   Fax:  [phone]  Physical Therapy Treatment  Patient Details  Name: Todd Potter MRN: [id number] Date of Birth: 03/23/1951 Referring Provider (PT): [name] MD   Encounter Date: 02/05/2019  PT End of Session - 02/05/19 1203    Visit Number  19    Number of Visits  25    Date for PT Re-Evaluation  02/21/19    Authorization Type  initial eval: 11/29/18    PT Start Time  1107    PT Stop Time  1152    PT Time Calculation (min)  45 min    Equipment Utilized During Treatment  Gait belt    Activity Tolerance  Patient tolerated treatment well;No increased pain    Behavior During Therapy  WFL for tasks assessed/performed       Past Medical History:  Diagnosis Date  . Acanthosis nigricans   . CAD (coronary artery disease)   . Cardiomegaly   . Diabetes mellitus without complication ([place])   . History of adenomatous polyp of colon   . Hyperlipidemia   . Hypertension   . Knee pain   . Myocardial infarct [facility])     Past Surgical History:  Procedure Laterality Date  . CARDIAC CATHETERIZATION     Stent Placement x 5 to RCA   . COLONOSCOPY WITH PROPOFOL N/A 08/06/2016   Procedure: COLONOSCOPY WITH PROPOFOL;  Surgeon: Manya Silvas, MD;  Location: [facility] ENDOSCOPY;  Service: Endoscopy;  Laterality: N/A;  . CYSTECTOMY  1970&1990   x2 on tail bone    There were no vitals filed for this visit.  Subjective Assessment - 02/05/19 1109    Subjective  Pt is doing well today.  He reports no pain or soreness after last visit.  He reports no falls since last visit.  No new quesitons or concerns at this time.    Pertinent History  Pt reports getting up in the morning on 11/16/18 and upon waking noticed not having any strength in his left hand and his wife encouraged him to go to the hospital. He did not experience and numbness or tingling just weakness in  LUE. He did not notice any changes in strength or sensation LLE. The following day 11/17/18 he began to notice some toe drag of LLE and weakness. He called his doctor who encouraged him to go to the hospital in which he did. 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    Currently in Pain?  No/denies        Treatment: Warm up on octane fitness x 5 mins L 2  Seated LAQ LLE5#1x20 and 5 sec hold BLE with cues to keep ankle DF during ROM to facilitate improved knee control during gait tasks.  Airex balance beam tandem forward and backward x5 in each direciton with intermittent SUE support at side of therapy mat  Airex balance beam side stepping in a mini squat x5 in each direction with intermittent UE support on threapy mat.    Standing tandem on 1/2 foam flat side up  3x30sec with each foot in front   Star drill x5 on each foot   Step ups from airex onto 6" box with airex 2x10 performed bilaterally; intermittent UE support throughout.     Pt educated throughout session about proper posture and technique with exercises. Improved exercise technique, movement at target joints, use of target muscles  after min to mod verbal, visual, tactile cues.   Pt demonstrates good motivation throughout today's session.  He demonstrates difficulty with airex balance beam tandem walking, requiring intermittent SUE support from elevated mat table and CGA throughout for steadying.  Additionally, he demonstrates difficulty with tandem balance on 1/2 foam roll, demonstrating unsteadiness that required intermittent UE support for steadying and CGA throughout.  Pt will continue to benefit from skilled PT services in order to improve strength, balance, and gait safety.            PT Short Term Goals - 01/08/19 1023      PT SHORT TERM GOAL #1   Title  Pt will be independent with HEP in order to improve strength and balance in order to decrease fall risk and improve function at home  and work.    Time  6    Period  Weeks    Status  Achieved    Target Date  01/10/19        PT Long Term Goals - 01/08/19 1023      PT LONG TERM GOAL #1   Title  Pt will improve BERG by at least 3 points in order to demonstrate clinically significant improvement in balance.    Baseline  11/29/18: 50/56    Time  12    Period  Weeks    Status  On-going      PT LONG TERM GOAL #2   Title  Pt will decrease 5TSTS by at least 3 seconds in order to demonstrate clinically significant improvement in LE strength.    Baseline  11/29/18: 13.66s; 9/21 11.47sec    Time  12    Period  Weeks    Status  On-going    Target Date  02/21/19      PT LONG TERM GOAL #3   Title  Pt will decrease TUG to below 10 seconds/decrease in order to demonstrate decreased fall risk.    Baseline  11/29/18: 14.96s; on 01/08/19: 10.45s (no AD)    Time  12    Period  Weeks    Status  Revised    Target Date  02/21/19      PT LONG TERM GOAL #4   Title  Patient will improve walking speed to >1.0 m/s in order to increase safety for community ambulation AEB 10MWT    Baseline  11/29/18: Self-selected: 16.5s =  0.61 m/s; Fastest: 11.4s = 0.88 m/s; 01/08/19: 1.90m/s    Time  12    Status  Achieved            Plan - 02/05/19 1202    Clinical Impression Statement  Pt demonstrates good motivation throughout today's session.  He demonstrates difficulty with airex balance beam tandem walking, requiring intermittent SUE support from elevated mat table and CGA throughout for steadying.  Additionally, he demonstrates difficulty with tandem balance on 1/2 foam roll, demonstrating unsteadiness that required intermittent UE support for steadying and CGA throughout.  Pt will continue to benefit from skilled PT services in order to improve strength, balance, and gait safety.    Personal Factors and Comorbidities  Comorbidity 3+;Age    Comorbidities  CAD, HTN, DM, hyperlipidemia    Examination-Activity Limitations  Squat;Locomotion  Level;Stand    Examination-Participation Restrictions  Driving;Yard Work;Other    Stability/Clinical Decision Making  Evolving/Moderate complexity    Rehab Potential  Good    PT Frequency  2x / week    PT Duration  12 weeks  PT Treatment/Interventions  ADLs/Self Care Home Management;Aquatic Therapy;Biofeedback;Canalith Repostioning;Cryotherapy;Electrical Stimulation;Ultrasound;Moist Heat;Iontophoresis 4mg /ml Dexamethasone;Gait training;Stair training;Functional mobility training;Therapeutic activities;Therapeutic exercise;Balance training;Patient/family education;Neuromuscular re-education;Manual techniques;Joint Manipulations;Spinal Manipulations;Vestibular;Traction;DME Instruction;Cognitive remediation;Orthotic Fit/Training;Passive range of motion;Dry needling;Splinting    PT Next Visit Plan  Update outcome measures, goals, and progress note. Continue strengthening and balance exercises, utilize NMES for L ankle strength as appropriate    PT Home Exercise Plan  Access Code: [email or web address]    Consulted and Agree with Plan of Care  Patient       Patient will benefit from skilled therapeutic intervention in order to improve the following deficits and impairments:  Abnormal gait, Decreased coordination, Difficulty walking, Impaired UE functional use, Decreased balance, Decreased mobility, Decreased strength  Visit Diagnosis: Muscle weakness (generalized)  Abnormal gait  Unsteadiness on feet     Problem List Patient Active Problem List   Diagnosis Date Noted  . CVA (cerebrovascular accident) ([place]) 11/17/2018  . Morbid obesity ([place]) 02/07/2015  . Knee pain 02/06/2015  . History of adenomatous polyp of colon 02/06/2015  . Acanthosis nigricans 02/06/2015  . Hyperlipidemia, mixed 01/12/2011  . 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 Pure hypercholesterolemia 03/13/2007  . Essential hypertension 03/13/2007  . Personal history of tobacco use 02/08/2007  . Cardiac enlargement 10/18/2006  .  Myocardial infarct, old 09/18/2006  . 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 CAD (coronary artery disease) 09/18/2006    This entire session was performed under direct supervision and direction of a licensed therapist/therapist assistant . I have personally read, edited and approve of the note as written.   Lutricia Horsfall, SPT Phillips Grout PT, DPT, GCS  Huprich,Jason 02/06/2019, 10:59 AM  [place] MAIN [facility] SERVICES [address] [place], [place], [address] Phone: [phone]   Fax:  [phone]  Name: Todd Potter MRN: [id number] Date of Birth: 1950/05/09

## 2019-02-07 ENCOUNTER — Ambulatory Visit: Payer: Medicare Other | Admitting: Physical Therapy

## 2019-02-07 ENCOUNTER — Other Ambulatory Visit: Payer: Self-pay

## 2019-02-07 ENCOUNTER — Encounter: Payer: Self-pay | Admitting: Physical Therapy

## 2019-02-07 DIAGNOSIS — R269 Unspecified abnormalities of gait and mobility: Secondary | ICD-10-CM

## 2019-02-07 DIAGNOSIS — R2681 Unsteadiness on feet: Secondary | ICD-10-CM | POA: Diagnosis not present

## 2019-02-07 DIAGNOSIS — R278 Other lack of coordination: Secondary | ICD-10-CM

## 2019-02-07 DIAGNOSIS — M6281 Muscle weakness (generalized): Secondary | ICD-10-CM | POA: Diagnosis not present

## 2019-02-07 DIAGNOSIS — R531 Weakness: Secondary | ICD-10-CM | POA: Diagnosis not present

## 2019-02-07 NOTE — Therapy (Signed)
[place] MAIN [facility] SERVICES [address] [place], [place], [address] Phone: [phone]   Fax:  [phone]  Physical Therapy Progress Note   Dates of reporting period  01/08/2019   to   02/07/2019   Patient Details  Name: Todd Potter MRN: [id number] Date of Birth: 06-20-1950 Referring Provider (PT): [name] MD   Encounter Date: 02/07/2019  PT End of Session - 02/07/19 1235    Visit Number  20    Number of Visits  25    Date for PT Re-Evaluation  02/21/19    Authorization Type  initial eval: 11/29/18    PT Start Time  1150    PT Stop Time  1230    PT Time Calculation (min)  40 min    Equipment Utilized During Treatment  Gait belt    Activity Tolerance  Patient tolerated treatment well;No increased pain    Behavior During Therapy  WFL for tasks assessed/performed       Past Medical History:  Diagnosis Date  . Acanthosis nigricans   . CAD (coronary artery disease)   . Cardiomegaly   . Diabetes mellitus without complication ([place])   . History of adenomatous polyp of colon   . Hyperlipidemia   . Hypertension   . Knee pain   . Myocardial infarct [facility])     Past Surgical History:  Procedure Laterality Date  . CARDIAC CATHETERIZATION     Stent Placement x 5 to RCA   . COLONOSCOPY WITH PROPOFOL N/A 08/06/2016   Procedure: COLONOSCOPY WITH PROPOFOL;  Surgeon: Manya Silvas, MD;  Location: [facility] ENDOSCOPY;  Service: Endoscopy;  Laterality: N/A;  . CYSTECTOMY  1970&1990   x2 on tail bone    There were no vitals filed for this visit.  Subjective Assessment - 02/07/19 1242    Subjective  Patient claims he is doing well today, no new concerns since last visit and is not experiencing any pain or soreness today.    Pertinent History  Pt reports getting up in the morning on 11/16/18 and upon waking noticed not having any strength in his left hand and his wife encouraged him to go to the hospital. He did not experience and  numbness or tingling just weakness in LUE. He did not notice any changes in strength or sensation LLE. The following day 11/17/18 he began to notice some toe drag of LLE and weakness. He called his doctor who encouraged him to go to the hospital in which he did. 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    Currently in Pain?  No/denies    Pain Score  0-No pain    Multiple Pain Sites  No        Treatment:  Patient instructed in the following outcome measures:  BBT: (8/12 50/56) 54/56  5xSTS: (8/12: 13.66s; 9/21: 11.47s) 9.59s  TUG: (8/12: 14.96s; 9/21: 10.45s) 9.39s  MiniBEST: 24/28; difficulty with SLS and stepping over obstacles.  -Matrix walkouts 22.5# x3 laps fwd/bwd, side/side each direction  -Fwd amb with step over orange hurdle x6; festering gait when approaching obstacle, no difficulty with initial step over but patient has tendency to swing following step around obstacle instead of over.   Neuro Re-ed: Patient requires supervision- CGA during all standing interventions due to limited stability and patient fear of LOB. Cueing for reduction of UE support required as well as postural alignment for optimal stability within COM   Response to Treatment:  Patient  demonstrates good motivation throughout today's session. Patient was able to perform advanced balance intervention with minimal LOB during resisted walkouts, required CGA and min VC to take bigger steps. Patient continues to be challenged by maintaining SLS time, unable to exceed 4 sec on either LE, as well as difficulty with stepping over hurdles, with a tendency to swing his foot around the obstacle and exhibits shuffling gait when approaching obstacle. Patient will continue to benefit from skilled PT to address the deficits outlined in this note.   Patient's condition has the potential to improve in response to therapy. Maximum improvement is yet to be obtained. The anticipated improvement is attainable  and reasonable in a generally predictable time. Patient reports HEP compliance and improvements with ADLs/IADLs.       PT Education - 02/07/19 1243    Education Details  HEP    Person(s) Educated  Patient    Methods  Explanation;Demonstration    Comprehension  Verbalized understanding;Need further instruction;Returned demonstration       PT Short Term Goals - 01/08/19 1023      PT SHORT TERM GOAL #1   Title  Pt will be independent with HEP in order to improve strength and balance in order to decrease fall risk and improve function at home and work.    Time  6    Period  Weeks    Status  Achieved    Target Date  01/10/19        PT Long Term Goals - 02/07/19 1248      PT LONG TERM GOAL #1   Title  Pt will improve BERG by at least 3 points in order to demonstrate clinically significant improvement in balance.    Baseline  11/29/18: 50/56; 02/07/19: 54/56    Time  12    Period  Weeks    Status  Achieved    Target Date  02/21/19      PT LONG TERM GOAL #2   Title  Pt will decrease 5TSTS by at least 3 seconds in order to demonstrate clinically significant improvement in LE strength.    Baseline  11/29/18: 13.66s; 9/21 11.47sec; 02/07/19: 9.59 sec    Time  12    Period  Weeks    Status  Achieved    Target Date  02/21/19      PT LONG TERM GOAL #3   Title  Pt will decrease TUG to below 10 seconds/decrease in order to demonstrate decreased fall risk.    Baseline  11/29/18: 14.96s; on 01/08/19: 10.45s (no AD); 02/07/19: 9.39 sec (no AD);    Time  12    Period  Weeks    Status  Achieved    Target Date  02/21/19      PT LONG TERM GOAL #4   Title  Patient will improve walking speed to >1.0 m/s in order to increase safety for community ambulation AEB 10MWT    Baseline  11/29/18: Self-selected: 16.5s =  0.61 m/s; Fastest: 11.4s = 0.88 m/s; 01/08/19: 1.27m/s    Time  12    Status  Achieved    Target Date  02/21/19      PT LONG TERM GOAL #5   Title  Patient will be able to hold SLS  for >10 sec to improve gait safety, obstacle negotiation, and independence with ADLs/IADLs.    Baseline  02/07/19: BLE <4 sec SLS;    Time  12    Period  Weeks    Status  New    Target Date  02/21/19            Plan - 02/07/19 1247    Clinical Impression Statement  Patient demonstrates good motivation throughout today's session. Patient was able to perform advanced balance intervention with minimal LOB during resisted walkouts, required CGA and min VC to take bigger steps. Patient continues to be challenged by maintaining SLS time, unable to exceed 4 sec on either LE, as well as difficulty with stepping over hurdles, with a tendency to swing his foot around the obstacle and exhibits shuffling gait when approaching obstacle. Patient will continue to benefit from skilled PT to address the deficits outlined in this note.    Personal Factors and Comorbidities  Comorbidity 3+;Age    Comorbidities  CAD, HTN, DM, hyperlipidemia    Examination-Activity Limitations  Squat;Locomotion Level;Stand    Examination-Participation Restrictions  Driving;Yard Work;Other    Stability/Clinical Decision Making  Evolving/Moderate complexity    Rehab Potential  Good    PT Frequency  2x / week    PT Duration  12 weeks    PT Treatment/Interventions  ADLs/Self Care Home Management;Aquatic Therapy;Biofeedback;Canalith Repostioning;Cryotherapy;Electrical Stimulation;Ultrasound;Moist Heat;Iontophoresis 4mg /ml Dexamethasone;Gait training;Stair training;Functional mobility training;Therapeutic activities;Therapeutic exercise;Balance training;Patient/family education;Neuromuscular re-education;Manual techniques;Joint Manipulations;Spinal Manipulations;Vestibular;Traction;DME Instruction;Cognitive remediation;Orthotic Fit/Training;Passive range of motion;Dry needling;Splinting    PT Next Visit Plan  Discuss POC and impending discharge, work on SLS balance and obstacle negotiation.    PT Home Exercise Plan  Access Code:  [email or web address]    Consulted and Agree with Plan of Care  Patient       Patient will benefit from skilled therapeutic intervention in order to improve the following deficits and impairments:  Abnormal gait, Decreased coordination, Difficulty walking, Impaired UE functional use, Decreased balance, Decreased mobility, Decreased strength  Visit Diagnosis: Muscle weakness (generalized)  Other lack of coordination  Abnormal gait  Unsteadiness on feet     Problem List Patient Active Problem List   Diagnosis Date Noted  . CVA (cerebrovascular accident) ([place]) 11/17/2018  . Morbid obesity ([place]) 02/07/2015  . Knee pain 02/06/2015  . History of adenomatous polyp of colon 02/06/2015  . Acanthosis nigricans 02/06/2015  . Hyperlipidemia, mixed 01/12/2011  . 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 Pure hypercholesterolemia 03/13/2007  . Essential hypertension 03/13/2007  . Personal history of tobacco use 02/08/2007  . Cardiac enlargement 10/18/2006  . Myocardial infarct, old 09/18/2006  . Controlled diabetes mellitus with renal manifestations ([place]) 09/18/2006  . CAD (coronary artery disease) 09/18/2006   Jeneen Rinks A. Rosana Hoes, SPT This entire session was performed under direct supervision and direction of a licensed therapist/therapist assistant . I have personally read, edited and approve of the note as written.  Trotter,Margaret PT, DPT 02/07/2019, 2:59 PM  [place] MAIN [facility] SERVICES [address] [place], [place], [address] Phone: [phone]   Fax:  [phone]  Name: Todd Potter MRN: [id number] Date of Birth: March 28, 1951

## 2019-02-09 ENCOUNTER — Ambulatory Visit: Payer: Medicare Other

## 2019-02-13 ENCOUNTER — Ambulatory Visit: Payer: Medicare Other | Admitting: Physical Therapy

## 2019-02-13 ENCOUNTER — Other Ambulatory Visit: Payer: Self-pay

## 2019-02-13 ENCOUNTER — Encounter: Payer: Self-pay | Admitting: Physical Therapy

## 2019-02-13 ENCOUNTER — Ambulatory Visit: Payer: Medicare Other | Admitting: Occupational Therapy

## 2019-02-13 ENCOUNTER — Encounter: Payer: Self-pay | Admitting: Occupational Therapy

## 2019-02-13 DIAGNOSIS — R278 Other lack of coordination: Secondary | ICD-10-CM

## 2019-02-13 DIAGNOSIS — R531 Weakness: Secondary | ICD-10-CM | POA: Diagnosis not present

## 2019-02-13 DIAGNOSIS — R2681 Unsteadiness on feet: Secondary | ICD-10-CM | POA: Diagnosis not present

## 2019-02-13 DIAGNOSIS — M6281 Muscle weakness (generalized): Secondary | ICD-10-CM | POA: Diagnosis not present

## 2019-02-13 DIAGNOSIS — R269 Unspecified abnormalities of gait and mobility: Secondary | ICD-10-CM

## 2019-02-13 NOTE — Patient Instructions (Signed)
AMBULATION: Treadmill    Walk on treadmill. Set at _2.5+__ mph. Walk _20+__ minutes per set, _3__ days per week Hold rails.  Copyright  VHI. All rights reserved.  Strength, Endurance: Stationary Bike - Sitting    Pedal forward or backward. Adjust seat so leg is nearly straight when down. Make sure that bike has a back to it.  Do __15-20__ minutes per day.  http://cc.exer.us/36    For all weight machines: Add _5__ lbs when you achieve _15__ repetitions without difficulty.  Copyright  VHI. All rights reserved.  HIP / KNEE: Extension (Hip Sled)    Place feet shoulder-width apart. Push down into heels to straighten knees and hips. Do not hyperextend or lock knees. Hold __2_ seconds. Use _90-120__ lbs. _12-15__ reps per set, _2__ sets per day, __3_ days per week   Copyright  VHI. All rights reserved.  Heel Raise - Incline (Machine)    Ankles flexed and calves stretched, (keeping knees straight) press toes forward as far as possible. 90-120#, do 12-15 repetitions Do __2__ sets.  http://st.exer.us/148   Copyright  VHI. All rights reserved.  Leg Abduction: Sitting (Machine)    Move legs outward and slowly return to start. Start at 40-50#, do sets of 12-15 repetitions  http://st.exer.us/354   Copyright  VHI. All rights reserved.  Leg Adduction: Sitting (Machine)    Legs separated, move legs together and slowly return to start. Start at about 40-50# Do 2 sets of 12-15 reps http://st.exer.us/362   Copyright  VHI. All rights reserved.  Knee Flexion: Hamstring Drop (Eccentric) - Seated (Weight Machine)    Sit on machine. Pull feet back until knees are at 90. Slowly release legs for 3-5 seconds. Start at 35# _12-15__ reps per set, _2__ sets per day, __3_ days per week.   http://ecce.exer.us/108   Copyright  VHI. All rights reserved.  Extension: Single Leg (Machine)    Straighten leg to locked knee position, keeping foot flexed toward knee. DO VERY  LIGHT WEIGHT- maybe 10# or less- (the more weight the more pressure on the knee) Do __2__ sets. Complete _15___ repetitions.  http://st.exer.us/342

## 2019-02-13 NOTE — Therapy (Signed)
[place] MAIN [facility] SERVICES [address] [place], [place], [address] Phone: [phone]   Fax:  [phone]  Physical Therapy Treatment/Discharge Summary  Patient Details  Name: Todd Potter MRN: [id number] Date of Birth: [birth year] Referring Provider (PT): [name] MD   Encounter Date: 02/13/2019  PT End of Session - 02/13/19 1023    Visit Number  21    Number of Visits  25    Date for PT Re-Evaluation  02/21/19    Authorization Type  initial eval: 11/29/18    PT Start Time  1100    PT Stop Time  1145    PT Time Calculation (min)  45 min    Equipment Utilized During Treatment  Gait belt    Activity Tolerance  Patient tolerated treatment well;No increased pain    Behavior During Therapy  WFL for tasks assessed/performed       Past Medical History:  Diagnosis Date  . Acanthosis nigricans   . CAD (coronary artery disease)   . Cardiomegaly   . Diabetes mellitus without complication ([place])   . History of adenomatous polyp of colon   . Hyperlipidemia   . Hypertension   . Knee pain   . Myocardial infarct [facility])     Past Surgical History:  Procedure Laterality Date  . CARDIAC CATHETERIZATION     Stent Placement x 5 to RCA   . COLONOSCOPY WITH PROPOFOL N/A 08/06/2016   Procedure: COLONOSCOPY WITH PROPOFOL;  Surgeon: Manya Silvas, MD;  Location: [facility] ENDOSCOPY;  Service: Endoscopy;  Laterality: N/A;  . CYSTECTOMY  1970&1990   x2 on tail bone    There were no vitals filed for this visit.  Subjective Assessment - 02/13/19 1104    Subjective  Patient states he is doing well today, no reports of pain or soreness this date.    Pertinent History  Pt reports getting up in the morning on 11/16/18 and upon waking noticed not having any strength in his left hand and his wife encouraged him to go to the hospital. He did not experience and numbness or tingling just weakness in LUE. He did not notice any changes in strength or sensation  LLE. The following day 11/17/18 he began to notice some toe drag of LLE and weakness. He called his doctor who encouraged him to go to the hospital in which he did. 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    Currently in Pain?  No/denies    Pain Score  0-No pain    Multiple Pain Sites  No       Treatment:  NMRE:  Warm up on octane fitness x 5 mins L 2  Airex balance beam tandem forward and backward x5 in each direction with intermittent SUE support   Standing marches on airex foam 2x20; cues to go slowly and raise feet high for increased SLS time  Step over hurdle fwd/bwd x10 each direction; cues to lift feet high for improved foot clearance  Lateral step over hurdle side/side x10 each direction  Standing tandem on 1/2 foam flat side up 3x30sec with each foot in front, unsupported  Star drill x5 on each foot; cues to perform 5 direction kicks without stepping on floor in between to challenge SLS time; patient very challenged by keeping SLS balance, able to perform <4 kicks in sequence without having to step on floor for balance.   Semi-tandem stance on airex pad balloon taps inside and outside  BOS x1 min each LE forward; occasional fingertip support when reaching far outside BOS  Soccer kicks x15 high velocity BLE to challenge dynamic SLS; able to perform high velocity kick without LOB  Discussed with patient discharge plan for continuing exercises at home and at gym, given a gym program and a blue TB to advance HEP exercises in case he does not regularly go to the gym.  Pt educated throughout session about proper posture and technique with exercises. Improved exercise technique, movement at target joints, use of target muscles after min to mod verbal, visual, tactile cues.   Patient demonstrates good motivation throughout today's session. Patient was able to perform balance interventions with progressions to challenge SLS time, required min VC for sequencing  and occasional UE support. Patient has some difficulty with sustained SLS time, however he is able to hold SLS for 6-8 seconds and is within functional limits for performing ADLs/IADLs. Patient has also reached his short and long term goals in regards to gait speed, transfer ability and balance as evidenced by 10MWT, TUG, 5xSTS, and BBT scores, respectively. Patient is recommended for discharge at this time, will continue exercises with gym program and progressed HEP.          PT Education - 02/13/19 1306    Education Details  POC, gym program    Person(s) Educated  Patient    Methods  Explanation;Handout    Comprehension  Verbalized understanding       PT Short Term Goals - 01/08/19 1023      PT SHORT TERM GOAL #1   Title  Pt will be independent with HEP in order to improve strength and balance in order to decrease fall risk and improve function at home and work.    Time  6    Period  Weeks    Status  Achieved    Target Date  01/10/19        PT Long Term Goals - 02/13/19 1513      PT LONG TERM GOAL #1   Title  Pt will improve BERG by at least 3 points in order to demonstrate clinically significant improvement in balance.    Baseline  11/29/18: 50/56; 02/07/19: 54/56    Time  12    Period  Weeks    Status  Achieved      PT LONG TERM GOAL #2   Title  Pt will decrease 5TSTS by at least 3 seconds in order to demonstrate clinically significant improvement in LE strength.    Baseline  11/29/18: 13.66s; 9/21 11.47sec; 02/07/19: 9.59 sec    Time  12    Period  Weeks    Status  Achieved      PT LONG TERM GOAL #3   Title  Pt will decrease TUG to below 10 seconds/decrease in order to demonstrate decreased fall risk.    Baseline  11/29/18: 14.96s; on 01/08/19: 10.45s (no AD); 02/07/19: 9.39 sec (no AD);    Time  12    Period  Weeks    Status  Achieved      PT LONG TERM GOAL #4   Title  Patient will improve walking speed to >1.0 m/s in order to increase safety for community  ambulation AEB 10MWT    Baseline  11/29/18: Self-selected: 16.5s =  0.61 m/s; Fastest: 11.4s = 0.88 m/s; 01/08/19: 1.62ms    Time  12    Status  Achieved      PT LONG TERM GOAL #5  Title  Patient will be able to hold SLS for >10 sec to improve gait safety, obstacle negotiation, and independence with ADLs/IADLs.    Baseline  02/07/19: BLE <4 sec SLS;    Time  12    Period  Weeks    Status  Partially Met    Target Date  02/21/19            Plan - 02/13/19 1307    Clinical Impression Statement  Patient demonstrates good motivation throughout today's session. Patient was able to perform balance interventions with progressions to challenge SLS time, required min VC for sequencing and occasional UE support. Patient has some difficulty with sustained SLS time, however he is able to hold SLS for 6-8 seconds and is within functional limits for performing ADLs/IADLs. Patient has also reached his short and long term goals in regards to gait speed, transfer ability and balance as evidenced by 10MWT, TUG, 5xSTS, and BBT scores, respectively. Patient is recommended for discharge at this time, will continue exercises with gym program and progressed HEP.    Personal Factors and Comorbidities  Comorbidity 3+;Age    Comorbidities  CAD, HTN, DM, hyperlipidemia    Examination-Activity Limitations  Squat;Locomotion Level;Stand    Examination-Participation Restrictions  Driving;Yard Work;Other    Stability/Clinical Decision Making  Evolving/Moderate complexity    Rehab Potential  Good    PT Frequency  2x / week    PT Duration  12 weeks    PT Treatment/Interventions  ADLs/Self Care Home Management;Aquatic Therapy;Biofeedback;Canalith Repostioning;Cryotherapy;Electrical Stimulation;Ultrasound;Moist Heat;Iontophoresis 44m/ml Dexamethasone;Gait training;Stair training;Functional mobility training;Therapeutic activities;Therapeutic exercise;Balance training;Patient/family education;Neuromuscular  re-education;Manual techniques;Joint Manipulations;Spinal Manipulations;Vestibular;Traction;DME Instruction;Cognitive remediation;Orthotic Fit/Training;Passive range of motion;Dry needling;Splinting    PT Next Visit Plan  Discharged    PT Home Exercise Plan  Access Code: [email or web address]   Consulted and Agree with Plan of Care  Patient       Patient will benefit from skilled therapeutic intervention in order to improve the following deficits and impairments:  Abnormal gait, Decreased coordination, Difficulty walking, Impaired UE functional use, Decreased balance, Decreased mobility, Decreased strength  Visit Diagnosis: Muscle weakness (generalized)  Other lack of coordination  Abnormal gait  Unsteadiness on feet     Problem List Patient Active Problem List   Diagnosis Date Noted  . CVA (cerebrovascular accident) ([place] 11/17/2018  . Morbid obesity ([place] 02/07/2015  . Knee pain 02/06/2015  . History of adenomatous polyp of colon 02/06/2015  . Acanthosis nigricans 02/06/2015  . Hyperlipidemia, mixed 01/12/2011  . 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 Pure hypercholesterolemia 03/13/2007  . Essential hypertension 03/13/2007  . Personal history of tobacco use 02/08/2007  . Cardiac enlargement 10/18/2006  . Myocardial infarct, old 09/18/2006  . Controlled diabetes mellitus with renal manifestations ([place] 09/18/2006  . CAD (coronary artery disease) 09/18/2006   JJeneen RinksA. DRosana Hoes SPT This entire session was performed under direct supervision and direction of a licensed therapist/therapist assistant . I have personally read, edited and approve of the note as written.  Trotter,Margaret PT, DPT 02/13/2019, 3:14 PM  [place] [facility] [address]RSylva[address] [place] [address]Phone: [phone]  Fax:  [phone] Name: Todd KONICEKMRN: [id number] of Birth: 09/24/1950/04/13

## 2019-02-13 NOTE — Therapy (Signed)
[place] MAIN [facility] SERVICES [address] [place], [place], [address] Phone: [phone]   Fax:  [phone]  Occupational Therapy Treatment  Patient Details  Name: Todd Potter MRN: [id number] Date of Birth: [birth year] Referring Provider (OT): Dr. Caryn Section   Encounter Date: 02/13/2019  OT End of Session - 02/13/19 1013    Visit Number  17    Number of Visits  24    Date for OT Re-Evaluation  03/07/19    Authorization Type  Progress report period starting 01/17/2019    OT Start Time  1000    OT Stop Time  1045    OT Time Calculation (min)  45 min    Activity Tolerance  Patient tolerated treatment well    Behavior During Therapy  [facility] for tasks assessed/performed       Past Medical History:  Diagnosis Date  . Acanthosis nigricans   . CAD (coronary artery disease)   . Cardiomegaly   . Diabetes mellitus without complication ([place])   . History of adenomatous polyp of colon   . Hyperlipidemia   . Hypertension   . Knee pain   . Myocardial infarct [facility])     Past Surgical History:  Procedure Laterality Date  . CARDIAC CATHETERIZATION     Stent Placement x 5 to RCA   . COLONOSCOPY WITH PROPOFOL N/A 08/06/2016   Procedure: COLONOSCOPY WITH PROPOFOL;  Surgeon: Manya Silvas, MD;  Location: [facility] ENDOSCOPY;  Service: Endoscopy;  Laterality: N/A;  . CYSTECTOMY  1970&1990   x2 on tail bone    There were no vitals filed for this visit.  Subjective Assessment - 02/13/19 1009    Subjective   Pt. reports he is doing well.    Patient is accompanied by:  Family member    Pertinent History  Pt. is a 68 y.o. male who was diagnosed with a CVA with Left sided weakness. Pt. resides with his wife, and was previously independent with ADLs, and IADLs prior to onset of the CVA. 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 Pt. enjoying riding motorcycles with his wife.    Currently in Pain?  No/denies       OT TREATMENT    Neuro  muscular re-education:  Pt. worked on left hand [facility] skills grasping 1/8" beads using long nosed tweezers and placing them from horizontal, and vertical positions to a vertical position on the pegboard. Pt. Presents with compensation proximally in the right shoulder, and leaning to the right, however it has improved.   Response to Treatment:  Pt. reports that he starts off using his left hand for just about everything before swtiching to do it with his right hand. Pt. is using his left hand to open bottles, and medication bottles. Pt. continues to present with limited left hand [facility] skills, and continues to work on improving these skills in order to improve UE functioning during ADLs, and IADL tasks.                        OT Education - 02/13/19 1013    Education Details  [place], LUE strength    Person(s) Educated  Patient    Methods  Explanation    Comprehension  Verbalized understanding;Returned demonstration          OT Long Term Goals - 01/15/19 1042      OT LONG TERM GOAL #1   Title  Pt. will improve  grip strength by 10# to be able to sustain a firm hold on a motorcycle clutch.    Baseline  01/15/2019: Pt. is improving with legft hand grip, and is now able to a motorcycle clutch, however susatined firm gripping in limited.    Time  12    Period  Weeks    Status  On-going    Target Date  03/07/19      OT LONG TERM GOAL #2   Title  Pt. will improve lateral pinch strength by 3# to be able to hold a plate steady.    Baseline  01/15/2019: Pt. is able to hold a plate straight without tipping it with his left hand.    Time  12    Period  Weeks    Status  On-going    Target Date  03/07/19      OT LONG TERM GOAL #3   Title  Pt. will independently hike the left side of his pants.    Baseline  01/15/2019: Pt. wis improving with hiking his pants with his left hand    Time  12    Period  Weeks    Status  On-going    Target Date  03/07/19      OT LONG TERM GOAL #4    Title  Pt. will independently wash his RUE using his LUE.    Baseline  01/15/2019: Limited grip on the bathclooth    Time  12    Period  Weeks    Status  On-going    Target Date  03/07/19      OT LONG TERM GOAL #5   Title  Pt. will improvie left hand [facility] skills  to be able to tie his shoes independently    Baseline  01/15/2019: Pt. has improved with tying his shoes , however it takes increased time to complete.    Time  12    Period  Weeks    Status  New    Target Date  03/07/19            Plan - 02/13/19 1016    Clinical Impression Statement Pt. reports that he starts off using his left hand for just about everything before swtiching to do it with his right hand. Pt. is using his left hand to open bottles, and medication bottles. Pt. continues to present with limited left hand [facility] skills, and continues to work on improving these skills in order to improve UE functioning during ADLs, and IADL tasks.    OT Occupational Profile and History  Problem Focused Assessment - Including review of records relating to presenting problem    Occupational performance deficits (Please refer to evaluation for details):  ADL's;IADL's    Body Structure / Function / Physical Skills  ADL;IADL;FMC;ROM;UE functional use;Strength    Rehab Potential  Good    Clinical Decision Making  Several treatment options, min-mod task modification necessary    Comorbidities Affecting Occupational Performance:  May have comorbidities impacting occupational performance    Modification or Assistance to Complete Evaluation   Min-Moderate modification of tasks or assist with assess necessary to complete eval    OT Frequency  2x / week    OT Duration  12 weeks    OT Treatment/Interventions  Self-care/ADL training;Therapeutic activities;Therapeutic exercise;Patient/family education;DME and/or AE instruction;Neuromuscular education    Consulted and Agree with Plan of Care  Patient    Family Member Consulted  Wife        Patient will benefit  from skilled therapeutic intervention in order to improve the following deficits and impairments:   Body Structure / Function / Physical Skills: ADL, IADL, FMC, ROM, UE functional use, Strength       Visit Diagnosis: Muscle weakness (generalized)  Other lack of coordination    Problem List Patient Active Problem List   Diagnosis Date Noted  . CVA (cerebrovascular accident) ([place]) 11/17/2018  . Morbid obesity ([place]) 02/07/2015  . Knee pain 02/06/2015  . History of adenomatous polyp of colon 02/06/2015  . Acanthosis nigricans 02/06/2015  . Hyperlipidemia, mixed 01/12/2011  . 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 Pure hypercholesterolemia 03/13/2007  . Essential hypertension 03/13/2007  . Personal history of tobacco use 02/08/2007  . Cardiac enlargement 10/18/2006  . Myocardial infarct, old 09/18/2006  . Controlled diabetes mellitus with renal manifestations ([place]) 09/18/2006  . CAD (coronary artery disease) 09/18/2006    Harrel Carina, MS, OTR/L 02/13/2019, 10:30 AM  [place] MAIN [facility] SERVICES [address] [place], [place], [address] Phone: [phone]   Fax:  [phone]  Name: KOICHI CROCITTO MRN: [id number] Date of Birth: 30-Nov-1950

## 2019-02-15 ENCOUNTER — Ambulatory Visit: Payer: Medicare Other | Admitting: Physical Therapy

## 2019-02-15 ENCOUNTER — Other Ambulatory Visit: Payer: Self-pay

## 2019-02-15 ENCOUNTER — Encounter: Payer: Self-pay | Admitting: Occupational Therapy

## 2019-02-15 ENCOUNTER — Ambulatory Visit: Payer: Medicare Other | Admitting: Occupational Therapy

## 2019-02-15 DIAGNOSIS — R278 Other lack of coordination: Secondary | ICD-10-CM | POA: Diagnosis not present

## 2019-02-15 DIAGNOSIS — M6281 Muscle weakness (generalized): Secondary | ICD-10-CM | POA: Diagnosis not present

## 2019-02-15 DIAGNOSIS — R269 Unspecified abnormalities of gait and mobility: Secondary | ICD-10-CM | POA: Diagnosis not present

## 2019-02-15 DIAGNOSIS — R531 Weakness: Secondary | ICD-10-CM | POA: Diagnosis not present

## 2019-02-15 DIAGNOSIS — R2681 Unsteadiness on feet: Secondary | ICD-10-CM | POA: Diagnosis not present

## 2019-02-15 NOTE — Therapy (Signed)
[place] MAIN [facility] SERVICES [address] [place], [place], [address] Phone: [phone]   Fax:  [phone]  OT Treatment/Discharge Note  Patient Details  Name: Todd Potter MRN: [id number] Date of Birth: 1950-12-04 Referring Provider (OT): Dr. Caryn Section   Encounter Date: 02/15/2019  OT End of Session - 02/15/19 1023    Visit Number  18    Number of Visits  24    Date for OT Re-Evaluation  03/07/19    OT Start Time  1015    OT Stop Time  1055    OT Time Calculation (min)  40 min    Activity Tolerance  Patient tolerated treatment well    Behavior During Therapy  [facility] for tasks assessed/performed       Past Medical History:  Diagnosis Date  . Acanthosis nigricans   . CAD (coronary artery disease)   . Cardiomegaly   . Diabetes mellitus without complication ([place])   . History of adenomatous polyp of colon   . Hyperlipidemia   . Hypertension   . Knee pain   . Myocardial infarct [facility])     Past Surgical History:  Procedure Laterality Date  . CARDIAC CATHETERIZATION     Stent Placement x 5 to RCA   . COLONOSCOPY WITH PROPOFOL N/A 08/06/2016   Procedure: COLONOSCOPY WITH PROPOFOL;  Surgeon: Manya Silvas, MD;  Location: [facility] ENDOSCOPY;  Service: Endoscopy;  Laterality: N/A;  . CYSTECTOMY  1970&1990   x2 on tail bone    There were no vitals filed for this visit.  Subjective Assessment - 02/15/19 1023    Subjective   Pt. reports he is doing well.    Patient is accompanied by:  Family member    Pertinent History  Pt. is a 68 y.o. male who was diagnosed with a CVA with Left sided weakness. Pt. resides with his wife, and was previously independent with ADLs, and IADLs prior to onset of the CVA. 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 Pt. enjoying riding motorcycles with his wife.         [facility] OT Assessment - 02/15/19 0001      Coordination   Left 9 Hole Peg Test  27      Strength   Overall Strength  Comments  LUE strength: 5/5      Hand Function   Left Hand Grip (lbs)  46    Left Hand Lateral Pinch  12 lbs    Left 3 point pinch  11 lbs      OT TREATMENT    Measurements were obtained, and goals were reviewed with the pt.   Response to Treatment:  Pt. has made excellent progress overall, and has improved with LUE functioning. Pt. has made excellent progress with left UE motor control, and [facility] skills, and has improved with left grip strength, and pinch strength skills. Pt. is independent with HEPs and has met most goals established at the initial eval. Pt. is now appropriate for discharge from OT services.                 OT Education - 02/15/19 1023    Education Details  [place], LUE strength    Person(s) Educated  Patient    Methods  Explanation    Comprehension  Verbalized understanding;Returned demonstration          OT Long Term Goals - 02/15/19 1024      OT LONG TERM GOAL #1  Title  Pt. will improve grip strength by 10# to be able to sustain a firm hold on a motorcycle clutch.    Baseline  02/15/2019: Left grip strength has improved. Pt. is now able to hold  the clutch on his motorcycle.    Time  12    Period  Weeks    Status  Partially Met      OT LONG TERM GOAL #2   Title  Pt. will improve lateral pinch strength by 3# to be able to hold a plate steady.    Baseline  02/15/2019: Left pinch strength has improved. Pt. is now able to hold a plate steady with his left hand.    Time  12    Period  Weeks    Status  Achieved      OT LONG TERM GOAL #3   Title  Pt. will independently hike the left side of his pants.    Baseline  01/15/2019: Pt. is able to hike his pants with his left hand    Time  12    Period  Weeks    Status  Achieved      OT LONG TERM GOAL #4   Title  Pt. will independently wash his RUE using his LUE.    Baseline  02/15/2019 Independent    Time  12    Period  Weeks    Status  Achieved      OT LONG TERM GOAL #5   Title  Pt. will  improve left hand [facility] skills  to be able to tie his shoes independently    Baseline  02/15/2019: Independent    Time  12    Period  Weeks    Status  Achieved            Plan - 02/15/19 1024    Clinical Impression Statement Pt. has made excellent progress overall, and has improved with LUE functioning. Pt. has made excellent progress with left UE motor control, and [facility] skills, and has improved with left grip strength, and pinch strength skills. Pt. is independent with HEPs and has met most goals established at the initial eval. Pt. is now appropriate for discharge from OT services..    OT Occupational Profile and History  Problem Focused Assessment - Including review of records relating to presenting problem    Occupational performance deficits (Please refer to evaluation for details):  ADL's;IADL's    Body Structure / Function / Physical Skills  ADL;IADL;FMC;ROM;UE functional use;Strength    Rehab Potential  Good    Clinical Decision Making  Several treatment options, min-mod task modification necessary    Comorbidities Affecting Occupational Performance:  May have comorbidities impacting occupational performance    Modification or Assistance to Complete Evaluation   Min-Moderate modification of tasks or assist with assess necessary to complete eval    OT Frequency  2x / week    OT Duration  12 weeks    OT Treatment/Interventions  Self-care/ADL training;Therapeutic activities;Therapeutic exercise;Patient/family education;DME and/or AE instruction;Neuromuscular education    Consulted and Agree with Plan of Care  Patient    Family Member Consulted  Wife       Patient will benefit from skilled therapeutic intervention in order to improve the following deficits and impairments:   Body Structure / Function / Physical Skills: ADL, IADL, FMC, ROM, UE functional use, Strength       Visit Diagnosis: Muscle weakness (generalized)  Other lack of coordination    Problem List Patient  Active Problem List   Diagnosis Date Noted  . CVA (cerebrovascular accident) ([place]) 11/17/2018  . Morbid obesity ([place]) 02/07/2015  . Knee pain 02/06/2015  . History of adenomatous polyp of colon 02/06/2015  . Acanthosis nigricans 02/06/2015  . Hyperlipidemia, mixed 01/12/2011  . 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 Pure hypercholesterolemia 03/13/2007  . Essential hypertension 03/13/2007  . Personal history of tobacco use 02/08/2007  . Cardiac enlargement 10/18/2006  . Myocardial infarct, old 09/18/2006  . Controlled diabetes mellitus with renal manifestations ([place]) 09/18/2006  . CAD (coronary artery disease) 09/18/2006    Harrel Carina, MS, OTR/L 02/15/2019, 11:54 AM  [place] MAIN [facility] SERVICES [address] [place], [place], [address] Phone: [phone]   Fax:  [phone]  Name: Todd Potter MRN: [id number] Date of Birth: 08/05/1950

## 2019-02-16 ENCOUNTER — Telehealth: Payer: Self-pay | Admitting: Family Medicine

## 2019-02-16 NOTE — Telephone Encounter (Signed)
Please advise if ok to give samples.

## 2019-02-16 NOTE — Telephone Encounter (Signed)
Patient is calling asking for sample of Janumet 50-1000 mg.   We did get samples in today of this.   They are in the blue bin up front.

## 2019-02-16 NOTE — Telephone Encounter (Signed)
That's fine, he can have 1 months samples.

## 2019-02-16 NOTE — Telephone Encounter (Signed)
Samples left at the front desk. Left patient a message advising him they are ready for pick up.

## 2019-02-20 ENCOUNTER — Ambulatory Visit: Payer: Medicare Other | Admitting: Occupational Therapy

## 2019-02-20 ENCOUNTER — Ambulatory Visit: Payer: Medicare Other | Admitting: Physical Therapy

## 2019-02-21 NOTE — Progress Notes (Signed)
Subjective:   Todd Potter is a 68 y.o. male who presents for Medicare Annual/Subsequent preventive examination.    This visit is being conducted through telemedicine due to the COVID-19 pandemic. This patient has given me verbal consent via doximity to conduct this visit, patient states they are participating from their home address. Some vital signs may be absent or patient reported.    Patient identification: identified by name, DOB, and current address  Review of Systems:  N/A  Cardiac Risk Factors include: advanced age (>17men, >28 women);diabetes mellitus;dyslipidemia;hypertension;male gender     Objective:    Vitals: There were no vitals taken for this visit.  There is no height or weight on file to calculate BMI. Unable to obtain vitals due to visit being conducted via telephonically.   Advanced Directives 02/22/2019 12/06/2018 11/29/2018 11/17/2018 02/17/2018 01/14/2017 08/06/2016  Does Patient Have a Medical Advance Directive? No No No No No No No  Would patient like information on creating a medical advance directive? No - Patient declined - Yes (MAU/Ambulatory/Procedural Areas - Information given) No - Patient declined - No - Patient declined No - Patient declined    Tobacco Social History   Tobacco Use  Smoking Status Former Smoker  . Packs/day: 0.50  . Years: 30.00  . Pack years: 15.00  . Types: Cigarettes  . Quit date: 04/19/2006  . Years since quitting: 12.8  Smokeless Tobacco Never Used     Counseling given: Not Answered   Clinical Intake:  Pre-visit preparation completed: Yes  Pain : No/denies pain Pain Score: 0-No pain     Nutritional Risks: None Diabetes: Yes  How often do you need to have someone help you when you read instructions, pamphlets, or other written materials from your doctor or pharmacy?: 1 - Never   Diabetes:  Is the patient diabetic?  Yes type 2 If diabetic, was a CBG obtained today?  No  Did the patient bring in their  glucometer from home?  No  How often do you monitor your CBG's? Does not check BS.   Financial Strains and Diabetes Management:  Are you having any financial strains with the device, your supplies or your medication? No .  Does the patient want to be seen by Chronic Care Management for management of their diabetes?  No  Would the patient like to be referred to a Nutritionist or for Diabetic Management?  No   Diabetic Exams:  Diabetic Eye Exam: Completed 07/18/17. Overdue for diabetic eye exam. Pt has been advised about the importance in completing this exam. Eye exam scheduled 02/28/19.  Diabetic Foot Exam: Completed 11/02/13. Pt has been advised about the importance in completing this exam. Note made to follow up on this at next in office apt.    Interpreter Needed?: No  Information entered by :: [email or web address] LPN  Past Medical History:  Diagnosis Date  . Acanthosis nigricans   . CAD (coronary artery disease)   . Cardiomegaly   . Diabetes mellitus without complication ([place])   . History of adenomatous polyp of colon   . Hyperlipidemia   . Hypertension   . Knee pain   . Myocardial infarct ([place])   . Stroke [facility])    Past Surgical History:  Procedure Laterality Date  . CARDIAC CATHETERIZATION     Stent Placement x 5 to RCA   . COLONOSCOPY WITH PROPOFOL N/A 08/06/2016   Procedure: COLONOSCOPY WITH PROPOFOL;  Surgeon: Manya Silvas, MD;  Location: [facility] ENDOSCOPY;  Service: Endoscopy;  Laterality: N/A;  . CYSTECTOMY  1970&1990   x2 on tail bone   Family History  Problem Relation Age of Onset  . Diabetes Mother   . Diabetes Sister   . Anuerysm Brother   . Cancer Brother        brain  . Diabetes Brother    Social History   Socioeconomic History  . Marital status: Married    Spouse name: Not on file  . Number of children: 2  . Years of education: HS Grad  . Highest education level: High school graduate  Occupational History  . Occupation: Part-Time    Comment: Ecologist  . Financial resource strain: Not hard at all  . Food insecurity    Worry: Never true    Inability: Never true  . Transportation needs    Medical: No    Non-medical: No  Tobacco Use  . Smoking status: Former Smoker    Packs/day: 0.50    Years: 30.00    Pack years: 15.00    Types: Cigarettes    Quit date: 04/19/2006    Years since quitting: 12.8  . Smokeless tobacco: Never Used  Substance and Sexual Activity  . Alcohol use: Not Currently    Alcohol/week: 0.0 standard drinks  . Drug use: No  . Sexual activity: Not on file  Lifestyle  . Physical activity    Days per week: 0 days    Minutes per session: 0 min  . Stress: Not at all  Relationships  . Social Herbalist on phone: Patient refused    Gets together: Patient refused    Attends religious service: Patient refused    Active member of club or organization: Patient refused    Attends meetings of clubs or organizations: Patient refused    Relationship status: Not on file  Other Topics Concern  . Not on file  Social History Narrative  . Not on file    Outpatient Encounter Medications as of 02/22/2019  Medication Sig  . amLODipine (NORVASC) 10 MG tablet Take 1 tablet (10 mg total) by mouth daily.  Marland Kitchen aspirin EC 325 MG EC tablet Take 1 tablet (325 mg total) by mouth daily.  Marland Kitchen atorvastatin (LIPITOR) 80 MG tablet TAKE 1 TABLET DAILY  . empagliflozin (JARDIANCE) 25 MG TABS tablet TAKE 25 MG BY MOUTH DAILY. (NOT COVER- CONTACTING MD)  . glipiZIDE (GLUCOTROL XL) 10 MG 24 hr tablet TAKE 1 TABLET DAILY  . losartan (COZAAR) 100 MG tablet Take 1 tablet (100 mg total) by mouth daily.  . metoprolol (TOPROL-XL) 200 MG 24 hr tablet Take 1 tablet (200 mg total) by mouth daily.  . nitroGLYCERIN (NITROSTAT) 0.4 MG SL tablet Place 1 tablet under the tongue. 3-5 minutes x3 as nedded  . sitaGLIPtin-metformin (JANUMET) 50-1000 MG tablet Take 1 tablet by mouth 2 (two) times daily.  . tadalafil (CIALIS) 5  MG tablet Take 1 tablet (5 mg total) by mouth every other day as needed for erectile dysfunction.  . potassium chloride SA (K-DUR) 20 MEQ tablet Take 1 tablet (20 mEq total) by mouth daily for 5 days. (Patient not taking: Reported on 02/22/2019)   No facility-administered encounter medications on file as of 02/22/2019.     Activities of Daily Living In your present state of health, do you have any difficulty performing the following activities: 02/22/2019 11/17/2018  Hearing? N N  Vision? N N  Difficulty concentrating or making decisions? N N  Walking or  climbing stairs? N N  Dressing or bathing? N N  Doing errands, shopping? N N  Preparing Food and eating ? N -  Using the Toilet? N -  In the past six months, have you accidently leaked urine? N -  Do you have problems with loss of bowel control? N -  Managing your Medications? N -  Managing your Finances? N -  Housekeeping or managing your Housekeeping? N -  Some recent data might be hidden    Patient Care Team: [name], MD as PCP - General (Family Medicine) Shirley Friar, MD (Cardiology) [facility] [place]) [place], Raliegh Ip, NP as Nurse Practitioner (Cardiology) Garrel Ridgel, DPM as Consulting Physician (Podiatry)   Assessment:   This is a routine wellness examination for Todd Potter.  Exercise Activities and Dietary recommendations Current Exercise Habits: The patient does not participate in regular exercise at present, Exercise limited by: None identified  Goals    . Exercise 3x per week (30 min per time)     Recommend to start exercising 3 days a week for at least 30 minutes at a time.     . Reduce portion size             Fall Risk Fall Risk  02/22/2019 02/22/2019 02/17/2018 01/14/2017 08/05/2016  Falls in the past year? 0 0 0 No No  Number falls in past yr: 0 0 - - -  Injury with Fall? 0 0 - - -   FALL RISK PREVENTION PERTAINING TO THE HOME:  Any stairs in or around the home? No  If so, are  there any without handrails? N/A  Home free of loose throw rugs in walkways, pet beds, electrical cords, etc? Yes  Adequate lighting in your home to reduce risk of falls? Yes   ASSISTIVE DEVICES UTILIZED TO PREVENT FALLS:  Life alert? No  Use of a cane, walker or w/c? No  Grab bars in the bathroom? No  Shower chair or bench in shower? No  Elevated toilet seat or a handicapped toilet? No    TIMED UP AND GO:  Was the test performed? No .    Depression Screen PHQ 2/9 Scores 02/22/2019 02/17/2018 01/14/2017 08/05/2016  PHQ - 2 Score 0 0 0 0  PHQ- 9 Score - - - 2    Cognitive Function: Declined today.      6CIT Screen 01/14/2017  What Year? 0 points  What month? 0 points  What time? 0 points  Count back from 20 0 points  Months in reverse 0 points  Repeat phrase 2 points  Total Score 2    Immunization History  Administered Date(s) Administered  . Influenza,inj,Quad PF,6+ Mos 02/16/2017  . Influenza-Unspecified 01/18/2015, 02/01/2016  . Pneumococcal Conjugate-13 02/07/2015  . Pneumococcal Polysaccharide-23 04/19/2010, 08/25/2018  . Td 10/25/1997  . Tdap 03/10/2012    Qualifies for Shingles Vaccine? Yes . Due for Shingrix. Education has been provided regarding the importance of this vaccine. Pt has been advised to call insurance company to determine out of pocket expense. Advised may also receive vaccine at local pharmacy or Health Dept. Verbalized acceptance and understanding.  Tdap: Up to date   Flu Vaccine: Up to date   Pneumococcal Vaccine: Completed series  Screening Tests Health Maintenance  Topic Date Due  . FOOT EXAM  05/08/1960  . OPHTHALMOLOGY EXAM  07/19/2018  . HEMOGLOBIN A1C  05/21/2019  . COLONOSCOPY  08/06/2021  . TETANUS/TDAP  03/10/2022  . INFLUENZA VACCINE  Completed  .  Hepatitis C Screening  Completed  . PNA vac Low Risk Adult  Completed   Cancer Screenings:  Colorectal Screening: Completed 08/06/16. Repeat every 5 years.  Lung Cancer  Screening: (Low Dose CT Chest recommended if Age 98-80 years, 30 pack-year currently smoking OR have quit w/in 15years.) does qualify. Please follow up with this at in office apt.  Additional Screening:  Hepatitis C Screening: Up to date  Dental Screening: Recommended annual dental exams for proper oral hygiene  Community Resource Referral:  CRR required this visit?  No        Plan:  I have personally reviewed and addressed the Medicare Annual Wellness questionnaire and have noted the following in the patient's chart:  A. Medical and social history B. Use of alcohol, tobacco or illicit drugs  C. Current medications and supplements D. Functional ability and status E.  Nutritional status F.  Physical activity G. Advance directives H. List of other physicians I.  Hospitalizations, surgeries, and ER visits in previous 12 months J.  [place] such as hearing and vision if needed, cognitive and depression L. Referrals and appointments   In addition, I have reviewed and discussed with patient certain preventive protocols, quality metrics, and best practice recommendations. A written personalized care plan for preventive services as well as general preventive health recommendations were provided to patient.   [place] Score, [place]  [id number] Nurse Health Advisor  Nurse Notes: Pt needs a diabetic foot exam at next in office apt. Please follow up on low dose chest CT scan due to smoking history. Eye exam scheduled for 02/28/19.

## 2019-02-22 ENCOUNTER — Ambulatory Visit (INDEPENDENT_AMBULATORY_CARE_PROVIDER_SITE_OTHER): Payer: Medicare Other

## 2019-02-22 ENCOUNTER — Other Ambulatory Visit: Payer: Self-pay

## 2019-02-22 DIAGNOSIS — Z0000 Encounter for general adult medical examination without abnormal findings: Secondary | ICD-10-CM

## 2019-02-22 NOTE — Patient Instructions (Addendum)
Todd Potter , Thank you for taking time to come for your Medicare Wellness Visit. I appreciate your ongoing commitment to your health goals. Please review the following plan we discussed and let me know if I can assist you in the future.   Screening recommendations/referrals: Colonoscopy: Up to date, due 02/2021 Recommended yearly ophthalmology/optometry visit for glaucoma screening and checkup Recommended yearly dental visit for hygiene and checkup  Vaccinations: Influenza vaccine: Up to date Pneumococcal vaccine: Completed series Tdap vaccine: Up to date, due 02/2022 Shingles vaccine: Pt declines today.     Advanced directives: Advance directive discussed with you today. Please remind our office to give you a copy of the advance directives at your next in office apt. Advised to complete, notarize and return to clinic.   Conditions/risks identified: Continue to increase exercise to 3 days a week for at least 30 minutes at a time.   Next appointment: 03/02/19 @ 8:20 AM with Dr Caryn Section. Declined scheduling an AWV for 2021 at this time.   Preventive Care 69 Years and Older, Male Preventive care refers to lifestyle choices and visits with your health care provider that can promote health and wellness. What does preventive care include?  A yearly physical exam. This is also called an annual well check.  Dental exams once or twice a year.  Routine eye exams. Ask your health care provider how often you should have your eyes checked.  Personal lifestyle choices, including:  Daily care of your teeth and gums.  Regular physical activity.  Eating a healthy diet.  Avoiding tobacco and drug use.  Limiting alcohol use.  Practicing safe sex.  Taking low doses of aspirin every day.  Taking vitamin and mineral supplements as recommended by your health care provider. What happens during an annual well check? The services and screenings done by your health care provider during your annual  well check will depend on your age, overall health, lifestyle risk factors, and family history of disease. Counseling  Your health care provider may ask you questions about your:  Alcohol use.  Tobacco use.  Drug use.  Emotional well-being.  Home and relationship well-being.  Sexual activity.  Eating habits.  History of falls.  Memory and ability to understand (cognition).  Work and work Statistician. Screening  You may have the following tests or measurements:  Height, weight, and BMI.  Blood pressure.  Lipid and cholesterol levels. These may be checked every 5 years, or more frequently if you are over 69 years old.  Skin check.  Lung cancer screening. You may have this screening every year starting at age 69 if you have a 30-pack-year history of smoking and currently smoke or have quit within the past 15 years.  Fecal occult blood test (FOBT) of the stool. You may have this test every year starting at age 69.  Flexible sigmoidoscopy or colonoscopy. You may have a sigmoidoscopy every 5 years or a colonoscopy every 10 years starting at age 69.  Prostate cancer screening. Recommendations will vary depending on your family history and other risks.  Hepatitis C blood test.  Hepatitis B blood test.  Sexually transmitted disease (STD) testing.  Diabetes screening. This is done by checking your blood sugar (glucose) after you have not eaten for a while (fasting). You may have this done every 1-3 years.  Abdominal aortic aneurysm (AAA) screening. You may need this if you are a current or former smoker.  Osteoporosis. You may be screened starting at age 69 if you  are at high risk. Talk with your health care provider about your test results, treatment options, and if necessary, the need for more tests. Vaccines  Your health care provider may recommend certain vaccines, such as:  Influenza vaccine. This is recommended every year.  Tetanus, diphtheria, and acellular  pertussis (Tdap, Td) vaccine. You may need a Td booster every 10 years.  Zoster vaccine. You may need this after age 69.  Pneumococcal 13-valent conjugate (PCV13) vaccine. One dose is recommended after age 69.  Pneumococcal polysaccharide (PPSV23) vaccine. One dose is recommended after age 69. Talk to your health care provider about which screenings and vaccines you need and how often you need them. This information is not intended to replace advice given to you by your health care provider. Make sure you discuss any questions you have with your health care provider. Document Released: 05/02/2015 Document Revised: 12/24/2015 Document Reviewed: 02/04/2015 Elsevier Interactive Patient Education  2017 [employer] Prevention in the Home Falls can cause injuries. They can happen to people of all ages. There are many things you can do to make your home safe and to help prevent falls. What can I do on the outside of my home?  Regularly fix the edges of walkways and driveways and fix any cracks.  Remove anything that might make you trip as you walk through a door, such as a raised step or threshold.  Trim any bushes or trees on the path to your home.  Use bright outdoor lighting.  Clear any walking paths of anything that might make someone trip, such as rocks or tools.  Regularly check to see if handrails are loose or broken. Make sure that both sides of any steps have handrails.  Any raised decks and porches should have guardrails on the edges.  Have any leaves, snow, or ice cleared regularly.  Use sand or salt on walking paths during winter.  Clean up any spills in your garage right away. This includes oil or grease spills. What can I do in the bathroom?  Use night lights.  Install grab bars by the toilet and in the tub and shower. Do not use towel bars as grab bars.  Use non-skid mats or decals in the tub or shower.  If you need to sit down in the shower, use a plastic,  non-slip stool.  Keep the floor dry. Clean up any water that spills on the floor as soon as it happens.  Remove soap buildup in the tub or shower regularly.  Attach bath mats securely with double-sided non-slip rug tape.  Do not have throw rugs and other things on the floor that can make you trip. What can I do in the bedroom?  Use night lights.  Make sure that you have a light by your bed that is easy to reach.  Do not use any sheets or blankets that are too big for your bed. They should not hang down onto the floor.  Have a firm chair that has side arms. You can use this for support while you get dressed.  Do not have throw rugs and other things on the floor that can make you trip. What can I do in the kitchen?  Clean up any spills right away.  Avoid walking on wet floors.  Keep items that you use a lot in easy-to-reach places.  If you need to reach something above you, use a strong step stool that has a grab bar.  Keep electrical cords out  of the way.  Do not use floor polish or wax that makes floors slippery. If you must use wax, use non-skid floor wax.  Do not have throw rugs and other things on the floor that can make you trip. What can I do with my stairs?  Do not leave any items on the stairs.  Make sure that there are handrails on both sides of the stairs and use them. Fix handrails that are broken or loose. Make sure that handrails are as long as the stairways.  Check any carpeting to make sure that it is firmly attached to the stairs. Fix any carpet that is loose or worn.  Avoid having throw rugs at the top or bottom of the stairs. If you do have throw rugs, attach them to the floor with carpet tape.  Make sure that you have a light switch at the top of the stairs and the bottom of the stairs. If you do not have them, ask someone to add them for you. What else can I do to help prevent falls?  Wear shoes that:  Do not have high heels.  Have rubber  bottoms.  Are comfortable and fit you well.  Are closed at the toe. Do not wear sandals.  If you use a stepladder:  Make sure that it is fully opened. Do not climb a closed stepladder.  Make sure that both sides of the stepladder are locked into place.  Ask someone to hold it for you, if possible.  Clearly mark and make sure that you can see:  Any grab bars or handrails.  First and last steps.  Where the edge of each step is.  Use tools that help you move around (mobility aids) if they are needed. These include:  Canes.  Walkers.  Scooters.  Crutches.  Turn on the lights when you go into a dark area. Replace any light bulbs as soon as they burn out.  Set up your furniture so you have a clear path. Avoid moving your furniture around.  If any of your floors are uneven, fix them.  If there are any pets around you, be aware of where they are.  Review your medicines with your doctor. Some medicines can make you feel dizzy. This can increase your chance of falling. Ask your doctor what other things that you can do to help prevent falls. This information is not intended to replace advice given to you by your health care provider. Make sure you discuss any questions you have with your health care provider. Document Released: 01/30/2009 Document Revised: 09/11/2015 Document Reviewed: 05/10/2014 Elsevier Interactive Patient Education  2017 [employer].

## 2019-02-23 ENCOUNTER — Ambulatory Visit: Payer: Medicare Other | Admitting: Occupational Therapy

## 2019-02-23 ENCOUNTER — Ambulatory Visit: Payer: Medicare Other

## 2019-02-26 ENCOUNTER — Ambulatory Visit: Payer: Medicare Other | Admitting: Physical Therapy

## 2019-02-26 ENCOUNTER — Encounter: Payer: Medicare Other | Admitting: Occupational Therapy

## 2019-02-28 ENCOUNTER — Encounter: Payer: Medicare Other | Admitting: Occupational Therapy

## 2019-02-28 ENCOUNTER — Ambulatory Visit: Payer: Medicare Other | Admitting: Physical Therapy

## 2019-02-28 LAB — HM DIABETES EYE EXAM

## 2019-03-01 NOTE — Progress Notes (Signed)
Patient: Todd Potter Male    DOB: Nov 11, 1950   68 y.o.   MRN: [id number] Visit Date: 03/02/2019  Today's Provider: [name], MD   Chief Complaint  Patient presents with  . Diabetes  . Hypertension   Subjective:     HPI  Diabetes Mellitus Type II, Follow-up:   Lab Results  Component Value Date   HGBA1C 7.1 (H) 11/18/2018   HGBA1C 7.4 (A) 08/25/2018   HGBA1C 6.6 (A) 02/24/2018    Last seen for diabetes 6 months ago.  Management since then includes no changes. He reports good compliance with treatment. He is not having side effects.  Current symptoms include none and have been stable. Home blood sugar records: fasting range: not being checked at home  Episodes of hypoglycemia? no   Current insulin regiment: Is not on insulin Most Recent Eye Exam: patient states he had a exam Wednesday at [facility] Weight trend: stable Prior visit with dietician: No Current exercise: walking Current diet habits: in general, a "healthy" diet    Pertinent Labs:    Component Value Date/Time   CHOL 120 11/18/2018 0618   CHOL 125 03/02/2018 0853   TRIG 91 11/18/2018 0618   HDL 31 (L) 11/18/2018 0618   HDL 40 03/02/2018 0853   LDLCALC 71 11/18/2018 0618   LDLCALC 66 03/02/2018 0853   CREATININE 1.06 11/18/2018 0618    Wt Readings from Last 3 Encounters:  03/02/19 239 lb 3.2 oz (108.5 kg)  11/17/18 234 lb (106.1 kg)  08/25/18 232 lb (105.2 kg)    ------------------------------------------------------------------------  Hypertension, follow-up:  BP Readings from Last 3 Encounters:  03/02/19 108/74  12/20/18 (!) 150/76  12/18/18 135/80    He was last seen for hypertension 1 years ago.  Management since that visit includes no changes. He reports good compliance with treatment. He is not having side effects.  He is exercising. He is adherent to low salt diet.   Outside blood pressures are not being checked at home. He is experiencing none.  Patient  denies chest pain, chest pressure/discomfort, claudication, dyspnea, exertional chest pressure/discomfort, fatigue, irregular heart beat, lower extremity edema, near-syncope, orthopnea, palpitations, paroxysmal nocturnal dyspnea, syncope and tachypnea.   Cardiovascular risk factors include advanced age (older than 76 for men, 52 for women), diabetes mellitus, dyslipidemia, hypertension, male gender and obesity (BMI >= 30 kg/m2).  Use of agents associated with hypertension: none.     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 Was seen by neurology Dr. Irish Elders in patient and changed from 81 to 325 mg ECASA. Has completed all of his outpatient physical therapy and feels greatly improved, although left arm is still slightly weak than before. He doesn't feel it is limiting his activity at all.  He was noted to be mildly hypokalemic and discharge on short course of oral  Potassium and hctz was discontinued No Known Allergies   Current Outpatient Medications:  .  amLODipine (NORVASC) 10 MG tablet, Take 1 tablet (10 mg total) by mouth daily., Disp: 90 tablet, Rfl: 4 .  aspirin EC 325 MG EC tablet, Take 1 tablet (325 mg total) by  mouth daily., Disp: 30 tablet, Rfl: 0 .  atorvastatin (LIPITOR) 80 MG tablet, TAKE 1 TABLET DAILY, Disp: 90 tablet, Rfl: 3 .  empagliflozin (JARDIANCE) 25 MG TABS tablet, TAKE 25 MG BY MOUTH DAILY. (NOT COVER- CONTACTING MD), Disp: 7 tablet, Rfl: 0 .  glipiZIDE (GLUCOTROL XL) 10 MG 24 hr tablet, TAKE 1 TABLET DAILY, Disp: 90 tablet, Rfl: 3 .  losartan (COZAAR) 100 MG tablet, Take 1 tablet (100 mg total) by  mouth daily., Disp: 30 tablet, Rfl: 0 .  metoprolol (TOPROL-XL) 200 MG 24 hr tablet, Take 1 tablet (200 mg total) by mouth daily., Disp: 90 tablet, Rfl: 3 .  nitroGLYCERIN (NITROSTAT) 0.4 MG SL tablet, Place 1 tablet under the tongue. 3-5 minutes x3 as nedded, Disp: , Rfl:  .  sitaGLIPtin-metformin (JANUMET) 50-1000 MG tablet, Take 1 tablet by mouth 2 (two) times daily., Disp: 28 tablet, Rfl: 0 .  tadalafil (CIALIS) 5 MG tablet, Take 1 tablet (5 mg total) by mouth every other day as needed for erectile dysfunction., Disp: 30 tablet, Rfl: 5  Review of Systems  Constitutional: Negative for appetite change, chills and fever.  Respiratory: Negative for chest tightness, shortness of breath and wheezing.   Cardiovascular: Negative for chest pain and palpitations.  Gastrointestinal: Negative for abdominal pain, nausea and vomiting.    Social History   Tobacco Use  . Smoking status: Former Smoker    Packs/day: 0.50    Years: 30.00    Pack years: 15.00    Types: Cigarettes    Quit date: 04/19/2006    Years since quitting: 12.8  . Smokeless tobacco: Never Used  Substance Use Topics  . Alcohol use: Not Currently    Alcohol/week: 0.0 standard drinks      Objective:   BP 108/74 (BP Location: Right Arm, Patient Position: Sitting, Cuff Size: Large)   Pulse 73   Temp (!) 96.8 F (36 C) (Temporal)   Wt 239 lb 3.2 oz (108.5 kg)   BMI 38.03 kg/m  Vitals:   03/02/19 0818  BP: 108/74  Pulse: 73  Temp: (!) 96.8 F (36 C)  TempSrc: Temporal  Weight: 239 lb 3.2 oz (108.5 kg)  Body mass index is 38.03 kg/m.   Physical Exam   General Appearance:    Obese male in no acute distress  Eyes:    PERRL, conjunctiva/corneas clear, EOM's intact       Lungs:     Clear to auscultation bilaterally, respirations unlabored  Heart:    Normal heart rate. Normal rhythm. No murmurs, rubs, or gallops.   MS:   All extremities are intact.   Neurologic:   Awake, alert, oriented x 3. No apparent focal  neurological           defect.        Results for orders placed or performed in visit on 03/02/19  POCT HgB A1C  Result Value Ref Range   Hemoglobin A1C 6.1 (A) 4.0 - 5.6 %   Est. average glucose Bld gHb Est-mCnc 128        Assessment & Plan    1. Diabetes mellitus due to underlying condition, controlled, with diabetic nephropathy, without long-term current use of insulin ([place]) Very will controlled, will reduce to 1/2 tablet daily and will send mail order prescription for 5mg   - glipiZIDE (GLUCOTROL XL) 10 MG 24 hr tablet; 1/2 tablet daily  2. Hypokalemia  - Renal function panel - Magnesium  3. History of cerebrovascular accident (CVA) with residual deficit  Has completed PT and nearly back to baseline. Is not limited any of his functions. Is having no affect on ability to safely operate motor vehicle. 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   4. Essential hypertension Well controlled.  Continue current medications.    He is changing to [facility] order pharmacy and needs all prescriptions sent there.  Future Appointments  Date Time Provider [place]  06/29/2019  8:20 AM [name], MD [facility] None    The entirety of the information documented in the History of Present Illness, Review of Systems and Physical Exam were personally obtained by me. Portions of this information were initially documented by Meyer Cory, CMA and reviewed by me for thoroughness and accuracy.         [name], MD  [facility]

## 2019-03-02 ENCOUNTER — Other Ambulatory Visit: Payer: Self-pay

## 2019-03-02 ENCOUNTER — Ambulatory Visit (INDEPENDENT_AMBULATORY_CARE_PROVIDER_SITE_OTHER): Payer: Medicare Other | Admitting: Family Medicine

## 2019-03-02 ENCOUNTER — Encounter: Payer: Self-pay | Admitting: Family Medicine

## 2019-03-02 VITALS — BP 108/74 | HR 73 | Temp 96.8°F | Wt 239.2 lb

## 2019-03-02 DIAGNOSIS — I10 Essential (primary) hypertension: Secondary | ICD-10-CM

## 2019-03-02 DIAGNOSIS — I6930 Unspecified sequelae of cerebral infarction: Secondary | ICD-10-CM

## 2019-03-02 DIAGNOSIS — E0821 Diabetes mellitus due to underlying condition with diabetic nephropathy: Secondary | ICD-10-CM | POA: Diagnosis not present

## 2019-03-02 DIAGNOSIS — E876 Hypokalemia: Secondary | ICD-10-CM

## 2019-03-02 DIAGNOSIS — I639 Cerebral infarction, unspecified: Secondary | ICD-10-CM

## 2019-03-02 LAB — POCT GLYCOSYLATED HEMOGLOBIN (HGB A1C)
Est. average glucose Bld gHb Est-mCnc: 128
Hemoglobin A1C: 6.1 % — AB (ref 4.0–5.6)

## 2019-03-02 MED ORDER — JARDIANCE 25 MG PO TABS: ORAL_TABLET | ORAL | 4 refills | Status: DC

## 2019-03-02 MED ORDER — METOPROLOL SUCCINATE ER 200 MG PO TB24: 200.0000 mg | ORAL_TABLET | Freq: Every day | ORAL | 3 refills | Status: DC

## 2019-03-02 MED ORDER — GLIPIZIDE ER 10 MG PO TB24: ORAL_TABLET | ORAL | Status: DC

## 2019-03-02 MED ORDER — LOSARTAN POTASSIUM 100 MG PO TABS: 100.0000 mg | ORAL_TABLET | Freq: Every day | ORAL | 4 refills | Status: DC

## 2019-03-02 MED ORDER — ATORVASTATIN CALCIUM 80 MG PO TABS: 80.0000 mg | ORAL_TABLET | Freq: Every day | ORAL | 3 refills | Status: DC

## 2019-03-02 MED ORDER — GLIPIZIDE ER 5 MG PO TB24: 5.0000 mg | ORAL_TABLET | Freq: Every day | ORAL | 4 refills | Status: DC

## 2019-03-02 MED ORDER — JANUMET 50-1000 MG PO TABS: 1.0000 | ORAL_TABLET | Freq: Two times a day (BID) | ORAL | 4 refills | Status: DC

## 2019-03-02 NOTE — Patient Instructions (Signed)
.   Please review the attached list of medications and notify my office if there are any errors.   . Please bring all of your medications to every appointment so we can make sure that our medication list is the same as yours.   . You can cut back on glipizide XL to 1/2 of a 10mg  tablet every day. I'll send in a prescription for 5mg  tablets to your mail order pharmacy which you can start when the 10mg  tablets run out

## 2019-03-03 LAB — MAGNESIUM: Magnesium: 1.7 mg/dL (ref 1.6–2.3)

## 2019-03-03 LAB — RENAL FUNCTION PANEL
Albumin: 4.2 g/dL (ref 3.8–4.8)
BUN/Creatinine Ratio: 11 (ref 10–24)
BUN: 14 mg/dL (ref 8–27)
CO2: 23 mmol/L (ref 20–29)
Calcium: 9.9 mg/dL (ref 8.6–10.2)
Chloride: 102 mmol/L (ref 96–106)
Creatinine, Ser: 1.23 mg/dL (ref 0.76–1.27)
GFR calc Af Amer: 69 mL/min/{1.73_m2} (ref >59)
GFR calc non Af Amer: 60 mL/min/{1.73_m2} (ref >59)
Glucose: 92 mg/dL (ref 65–99)
Phosphorus: 3.8 mg/dL (ref 2.8–4.1)
Potassium: 3.8 mmol/L (ref 3.5–5.2)
Sodium: 138 mmol/L (ref 134–144)

## 2019-03-05 ENCOUNTER — Ambulatory Visit: Payer: Medicare Other

## 2019-03-05 ENCOUNTER — Encounter: Payer: Medicare Other | Admitting: Occupational Therapy

## 2019-03-07 ENCOUNTER — Ambulatory Visit: Payer: Medicare Other | Admitting: Physical Therapy

## 2019-03-07 ENCOUNTER — Encounter: Payer: Medicare Other | Admitting: Occupational Therapy

## 2019-03-12 ENCOUNTER — Ambulatory Visit: Payer: Medicare Other | Admitting: Physical Therapy

## 2019-03-12 ENCOUNTER — Encounter: Payer: Medicare Other | Admitting: Occupational Therapy

## 2019-03-12 ENCOUNTER — Other Ambulatory Visit: Payer: Self-pay

## 2019-03-12 NOTE — Telephone Encounter (Signed)
Patient came by the office requesting samples for Jardiance and Jamumet. Samples given to patient. We are out of regular Janumet 50-1000mg . Per Dr. Jacinto Reap okay to give samples of Janumet XR 50-1000mg  with patient taking one tablet daily. Qty of 2 bottles each given to patient.

## 2019-03-20 ENCOUNTER — Encounter: Payer: Medicare Other | Admitting: Occupational Therapy

## 2019-03-20 ENCOUNTER — Other Ambulatory Visit: Payer: Self-pay | Admitting: Family Medicine

## 2019-03-20 ENCOUNTER — Ambulatory Visit: Payer: Medicare Other | Admitting: Physical Therapy

## 2019-03-20 MED ORDER — METOPROLOL SUCCINATE ER 200 MG PO TB24: 200.0000 mg | ORAL_TABLET | Freq: Every day | ORAL | 0 refills | Status: DC

## 2019-03-20 NOTE — Telephone Encounter (Signed)
From [facility] 

## 2019-03-20 NOTE — Telephone Encounter (Signed)
Pt is requesting to have a 30 day supply sent to local pharmacy:   metoprolol (TOPROL-XL) 200 MG 24 hr tablet, he will not receive mail order in time.    Pharmacy:  [id number] - [place], [place] [phone] (Phone) [phone] (Fax)

## 2019-03-21 ENCOUNTER — Other Ambulatory Visit: Payer: Self-pay | Admitting: Family Medicine

## 2019-03-21 ENCOUNTER — Telehealth: Payer: Self-pay

## 2019-03-21 DIAGNOSIS — E0821 Diabetes mellitus due to underlying condition with diabetic nephropathy: Secondary | ICD-10-CM

## 2019-03-21 NOTE — Telephone Encounter (Signed)
I can't find the reason for the return to work note? What is it regarding?

## 2019-03-21 NOTE — Telephone Encounter (Signed)
Requested medication (s) are due for refill today: yes  Requested medication (s) are on the active medication list: yes  Future visit scheduled: yes  Notes to clinic: Patient states that he provider the office with a paper that has the pharmacy number on it. The [facility] is not the correct pharmacy and they only do script in [place]. It should be a [phone] number. Please advise    Requested Prescriptions  Pending Prescriptions Disp Refills   aspirin 325 MG EC tablet 30 tablet 0    Sig: Take 1 tablet (325 mg total) by mouth daily.     Analgesics:  NSAIDS - aspirin Passed - 03/21/2019 11:16 AM      Passed - Patient is not pregnant      Passed - Valid encounter within last 12 months    Recent Outpatient Visits          2 weeks ago Diabetes mellitus due to underlying condition, controlled, with diabetic nephropathy, without long-term current use of insulin [facility])   [facility] [name], MD   3 months ago Irregular heartbeat   [facility] [name], MD   6 months ago Diabetes mellitus due to underlying condition, controlled, with diabetic nephropathy, without long-term current use of insulin ([place])   [facility] [name], MD   1 year ago Diabetes mellitus due to underlying condition, controlled, with diabetic nephropathy, without long-term current use of insulin ([place])   [facility] [name], MD   1 year ago Uncontrolled type 2 diabetes mellitus without complication, without long-term current use of insulin [facility])   [facility] [name], MD      Future Appointments            In 3 months Fisher, Kirstie Peri, MD [facility]            metoprolol (TOPROL-XL) 200 MG 24 hr tablet 30 tablet 0    Sig: Take 1 tablet (200 mg total) by mouth daily.     Cardiovascular:  Beta Blockers Passed - 03/21/2019 11:16 AM      Passed - Last BP in normal range    BP  Readings from Last 1 Encounters:  03/02/19 108/74         Passed - Last Heart Rate in normal range    Pulse Readings from Last 1 Encounters:  03/02/19 73         Passed - Valid encounter within last 6 months    Recent Outpatient Visits          2 weeks ago Diabetes mellitus due to underlying condition, controlled, with diabetic nephropathy, without long-term current use of insulin ([place])   [facility] [name], MD   3 months ago Irregular heartbeat   [facility] [name], MD   6 months ago Diabetes mellitus due to underlying condition, controlled, with diabetic nephropathy, without long-term current use of insulin ([place])   [facility] [name], MD   1 year ago Diabetes mellitus due to underlying condition, controlled, with diabetic nephropathy, without long-term current use of insulin [facility])   [facility] [name], MD   1 year ago Uncontrolled type 2 diabetes mellitus without complication, without long-term current use of insulin [facility])   [facility] [name], MD      Future Appointments  In 3 months Fisher, Kirstie Peri, MD [facility]            atorvastatin (LIPITOR) 80 MG tablet 90 tablet 3    Sig: Take 1 tablet (80 mg total) by mouth daily.     Cardiovascular:  Antilipid - Statins Failed - 03/21/2019 11:16 AM      Failed - HDL in normal range and within 360 days    HDL  Date Value Ref Range Status  11/18/2018 31 (L) >40 mg/dL Final  03/02/2018 40 >39 mg/dL Final         Passed - Total Cholesterol in normal range and within 360 days    Cholesterol, Total  Date Value Ref Range Status  03/02/2018 125 100 - 199 mg/dL Final   Cholesterol  Date Value Ref Range Status  11/18/2018 120 0 - 200 mg/dL Final         Passed - LDL in normal range and within 360 days    LDL Calculated  Date Value Ref Range Status  03/02/2018 66 0 - 99  mg/dL Final   LDL Cholesterol  Date Value Ref Range Status  11/18/2018 71 0 - 99 mg/dL Final    Comment:           Total Cholesterol/HDL:CHD Risk Coronary Heart Disease Risk Table                     Men   Women  1/2 Average Risk   3.4   3.3  Average Risk       5.0   4.4  2 X Average Risk   9.6   7.1  3 X Average Risk  23.4   11.0        Use the calculated Patient Ratio above and the CHD Risk Table to determine the patient's CHD Risk.        ATP III CLASSIFICATION (LDL):  <100     mg/dL   Optimal  100-129  mg/dL   Near or Above                    Optimal  130-159  mg/dL   Borderline  160-189  mg/dL   High  >190     mg/dL   Very High Performed at [facility], [address] [place], [place] [address]          Passed - Triglycerides in normal range and within 360 days    Triglycerides  Date Value Ref Range Status  11/18/2018 91 <150 mg/dL Final         Passed - Patient is not pregnant      Passed - Valid encounter within last 12 months    Recent Outpatient Visits          2 weeks ago Diabetes mellitus due to underlying condition, controlled, with diabetic nephropathy, without long-term current use of insulin [facility])   [facility] [name], MD   3 months ago Irregular heartbeat   [facility] [name], MD   6 months ago Diabetes mellitus due to underlying condition, controlled, with diabetic nephropathy, without long-term current use of insulin ([place])   [facility] [name], MD   1 year ago Diabetes mellitus due to underlying condition, controlled, with diabetic nephropathy, without long-term current use of insulin [facility])   [facility] [name], MD   1 year ago Uncontrolled type 2 diabetes  mellitus without complication, without long-term current use of insulin [facility])   [place], MD      Future Appointments            In 3 months  Fisher, Kirstie Peri, MD [facility], [facility]            amLODipine (NORVASC) 10 MG tablet 90 tablet 4    Sig: Take 1 tablet (10 mg total) by mouth daily.     Cardiovascular:  Calcium Channel Blockers Passed - 03/21/2019 11:16 AM      Passed - Last BP in normal range    BP Readings from Last 1 Encounters:  03/02/19 108/74         Passed - Valid encounter within last 6 months    Recent Outpatient Visits          2 weeks ago Diabetes mellitus due to underlying condition, controlled, with diabetic nephropathy, without long-term current use of insulin ([place])   [facility] [name], MD   3 months ago Irregular heartbeat   [facility] [name], MD   6 months ago Diabetes mellitus due to underlying condition, controlled, with diabetic nephropathy, without long-term current use of insulin ([place])   [facility] [name], MD   1 year ago Diabetes mellitus due to underlying condition, controlled, with diabetic nephropathy, without long-term current use of insulin ([place])   [facility] [name], MD   1 year ago Uncontrolled type 2 diabetes mellitus without complication, without long-term current use of insulin ([place])   [facility] [name], MD      Future Appointments            In 3 months Fisher, Kirstie Peri, MD [facility], [facility]            empagliflozin (JARDIANCE) 25 MG TABS tablet 90 tablet 4    Sig: TAKE 25 MG BY MOUTH DAILY. (NOT COVER- CONTACTING MD)     Endocrinology:  Diabetes - SGLT2 Inhibitors Passed - 03/21/2019 11:16 AM      Passed - Cr in normal range and within 360 days    Creatinine, Ser  Date Value Ref Range Status  03/02/2019 1.23 0.76 - 1.27 mg/dL Final         Passed - LDL in normal range and within 360 days    LDL Calculated  Date Value Ref Range Status  03/02/2018 66 0 - 99 mg/dL Final   LDL Cholesterol  Date Value Ref Range  Status  11/18/2018 71 0 - 99 mg/dL Final    Comment:           Total Cholesterol/HDL:CHD Risk Coronary Heart Disease Risk Table                     Men   Women  1/2 Average Risk   3.4   3.3  Average Risk       5.0   4.4  2 X Average Risk   9.6   7.1  3 X Average Risk  23.4   11.0        Use the calculated Patient Ratio above and the CHD Risk Table to determine the patient's CHD Risk.        ATP III CLASSIFICATION (LDL):  <100     mg/dL   Optimal  100-129  mg/dL   Near or Above  Optimal  130-159  mg/dL   Borderline  160-189  mg/dL   High  >190     mg/dL   Very High Performed at [facility], [address] [place], [place] [address]          Passed - HBA1C is between 0 and 7.9 and within 180 days    Hemoglobin A1C  Date Value Ref Range Status  03/02/2019 6.1 (A) 4.0 - 5.6 % Final   Hgb A1c MFr Bld  Date Value Ref Range Status  11/18/2018 7.1 (H) 4.8 - 5.6 % Final    Comment:    (NOTE) Pre diabetes:          5.7%-6.4% Diabetes:              >6.4% Glycemic control for   <7.0% adults with diabetes          Passed - eGFR in normal range and within 360 days    GFR calc Af Amer  Date Value Ref Range Status  03/02/2019 69 >59 mL/min/1.73 Final   GFR calc non Af Amer  Date Value Ref Range Status  03/02/2019 60 >59 mL/min/1.73 Final         Passed - Valid encounter within last 6 months    Recent Outpatient Visits          2 weeks ago Diabetes mellitus due to underlying condition, controlled, with diabetic nephropathy, without long-term current use of insulin ([place])   [facility] [name], MD   3 months ago Irregular heartbeat   [facility] [name], MD   6 months ago Diabetes mellitus due to underlying condition, controlled, with diabetic nephropathy, without long-term current use of insulin ([place])   [facility] [name], MD   1 year ago Diabetes mellitus due to  underlying condition, controlled, with diabetic nephropathy, without long-term current use of insulin ([place])   [facility] [name], MD   1 year ago Uncontrolled type 2 diabetes mellitus without complication, without long-term current use of insulin ([place])   [facility] [name], MD      Future Appointments            In 3 months Fisher, Kirstie Peri, MD [facility]            glipiZIDE (GLUCOTROL XL) 5 MG 24 hr tablet 90 tablet 4    Sig: Take 1 tablet (5 mg total) by mouth daily with breakfast.     Endocrinology:  Diabetes - Sulfonylureas Passed - 03/21/2019 11:16 AM      Passed - HBA1C is between 0 and 7.9 and within 180 days    Hemoglobin A1C  Date Value Ref Range Status  03/02/2019 6.1 (A) 4.0 - 5.6 % Final   Hgb A1c MFr Bld  Date Value Ref Range Status  11/18/2018 7.1 (H) 4.8 - 5.6 % Final    Comment:    (NOTE) Pre diabetes:          5.7%-6.4% Diabetes:              >6.4% Glycemic control for   <7.0% adults with diabetes          Passed - Valid encounter within last 6 months    Recent Outpatient Visits          2 weeks ago Diabetes mellitus due to underlying condition, controlled, with diabetic nephropathy, without long-term current use of  insulin [facility])   [facility] [name], MD   3 months ago Irregular heartbeat   [facility] [name], MD   6 months ago Diabetes mellitus due to underlying condition, controlled, with diabetic nephropathy, without long-term current use of insulin [facility])   [facility] [name], MD   1 year ago Diabetes mellitus due to underlying condition, controlled, with diabetic nephropathy, without long-term current use of insulin [facility]   [facility] [name], MD   1 year ago Uncontrolled type 2 diabetes mellitus without complication, without long-term current use of insulin [facility])   [facility] [name], MD      Future Appointments            In 3 months Fisher, Kirstie Peri, MD [facility], [facility]            losartan (COZAAR) 100 MG tablet 90 tablet 4    Sig: Take 1 tablet (100 mg total) by mouth daily.     Cardiovascular:  Angiotensin Receptor Blockers Passed - 03/21/2019 11:16 AM      Passed - Cr in normal range and within 180 days    Creatinine, Ser  Date Value Ref Range Status  03/02/2019 1.23 0.76 - 1.27 mg/dL Final         Passed - K in normal range and within 180 days    Potassium  Date Value Ref Range Status  03/02/2019 3.8 3.5 - 5.2 mmol/L Final         Passed - Patient is not pregnant      Passed - Last BP in normal range    BP Readings from Last 1 Encounters:  03/02/19 108/74         Passed - Valid encounter within last 6 months    Recent Outpatient Visits          2 weeks ago Diabetes mellitus due to underlying condition, controlled, with diabetic nephropathy, without long-term current use of insulin ([place])   [facility] [name], MD   3 months ago Irregular heartbeat   [facility] [name], MD   6 months ago Diabetes mellitus due to underlying condition, controlled, with diabetic nephropathy, without long-term current use of insulin ([place])   [facility] [name], MD   1 year ago Diabetes mellitus due to underlying condition, controlled, with diabetic nephropathy, without long-term current use of insulin ([place])   [facility] [name], MD   1 year ago Uncontrolled type 2 diabetes mellitus without complication, without long-term current use of insulin [facility])   [facility] [name], MD      Future Appointments            In 3 months Fisher, Kirstie Peri, MD [facility], [facility]            sitaGLIPtin-metformin (JANUMET) 50-1000 MG tablet 180 tablet 4    Sig: Take 1 tablet by mouth 2 (two)  times daily.     Endocrinology:  Diabetes - Biguanide + DPP-4 Inhibitor Combos Passed - 03/21/2019 11:16 AM      Passed - HBA1C is between 0 and 7.9 and within 180 days    Hemoglobin A1C  Date Value Ref Range Status  03/02/2019 6.1 (A) 4.0 - 5.6 % Final   Hgb A1c MFr Bld  Date Value Ref Range Status  11/18/2018 7.1 (H)  4.8 - 5.6 % Final    Comment:    (NOTE) Pre diabetes:          5.7%-6.4% Diabetes:              >6.4% Glycemic control for   <7.0% adults with diabetes          Passed - Cr in normal range and within 360 days    Creatinine, Ser  Date Value Ref Range Status  03/02/2019 1.23 0.76 - 1.27 mg/dL Final         Passed - eGFR in normal range and within 360 days    GFR calc Af Amer  Date Value Ref Range Status  03/02/2019 69 >59 mL/min/1.73 Final   GFR calc non Af Amer  Date Value Ref Range Status  03/02/2019 60 >59 mL/min/1.73 Final         Passed - Valid encounter within last 6 months    Recent Outpatient Visits          2 weeks ago Diabetes mellitus due to underlying condition, controlled, with diabetic nephropathy, without long-term current use of insulin ([place])   [facility] [name], MD   3 months ago Irregular heartbeat   [facility] [name], MD   6 months ago Diabetes mellitus due to underlying condition, controlled, with diabetic nephropathy, without long-term current use of insulin ([place])   [facility] [name], MD   1 year ago Diabetes mellitus due to underlying condition, controlled, with diabetic nephropathy, without long-term current use of insulin ([place])   [facility] [name], MD   1 year ago Uncontrolled type 2 diabetes mellitus without complication, without long-term current use of insulin [facility])   [facility] [name], MD      Future Appointments            In 3 months Fisher, Kirstie Peri, MD [facility], [facility]             tadalafil (CIALIS) 5 MG tablet 30 tablet 5    Sig: Take 1 tablet (5 mg total) by mouth every other day as needed for erectile dysfunction.     Urology: Erectile Dysfunction Agents Passed - 03/21/2019 11:16 AM      Passed - Last BP in normal range    BP Readings from Last 1 Encounters:  03/02/19 108/74         Passed - Valid encounter within last 12 months    Recent Outpatient Visits          2 weeks ago Diabetes mellitus due to underlying condition, controlled, with diabetic nephropathy, without long-term current use of insulin [facility])   [facility] [name], MD   3 months ago Irregular heartbeat   [facility] [name], MD   6 months ago Diabetes mellitus due to underlying condition, controlled, with diabetic nephropathy, without long-term current use of insulin ([place])   [facility] [name], MD   1 year ago Diabetes mellitus due to underlying condition, controlled, with diabetic nephropathy, without long-term current use of insulin [facility])   [facility] [name], MD   1 year ago Uncontrolled type 2 diabetes mellitus without complication, without long-term current use of insulin [facility])   [facility] [name], MD      Future Appointments            In  3 months Fisher, Kirstie Peri, MD [facility], [facility]            nitroGLYCERIN (NITROSTAT) 0.4 MG SL tablet       Sig: Place 1 tablet (0.4 mg total) under the tongue. 3-5 minutes x3 as nedded     Cardiovascular:  Nitrates Passed - 03/21/2019 11:16 AM      Passed - Last BP in normal range    BP Readings from Last 1 Encounters:  03/02/19 108/74         Passed - Last Heart Rate in normal range    Pulse Readings from Last 1 Encounters:  03/02/19 73         Passed - Valid encounter within last 12 months    Recent Outpatient Visits          2 weeks ago Diabetes mellitus due to underlying condition,  controlled, with diabetic nephropathy, without long-term current use of insulin ([place])   [facility] [name], MD   3 months ago Irregular heartbeat   [facility] [name], MD   6 months ago Diabetes mellitus due to underlying condition, controlled, with diabetic nephropathy, without long-term current use of insulin ([place])   [facility] [name], MD   1 year ago Diabetes mellitus due to underlying condition, controlled, with diabetic nephropathy, without long-term current use of insulin [facility])   [facility] [name], MD   1 year ago Uncontrolled type 2 diabetes mellitus without complication, without long-term current use of insulin [facility])   [facility] [name], MD      Future Appointments            In 3 months Fisher, Kirstie Peri, MD [facility] [place]

## 2019-03-21 NOTE — Telephone Encounter (Signed)
Medication Refill - Medication:   amLODipine (NORVASC) 10 MG tablet    aspirin EC 325 MG EC tablet    atorvastatin (LIPITOR) 80 MG tablet    empagliflozin (JARDIANCE) 25 MG TABS tablet    glipiZIDE (GLUCOTROL XL) 5 MG 24 hr tablet    losartan (COZAAR) 100 MG tablet    metoprolol (TOPROL-XL) 200 MG 24 hr tablet    nitroGLYCERIN (NITROSTAT) 0.4 MG SL tablet    sitaGLIPtin-metformin (JANUMET) 50-1000 MG tablet    tadalafil (CIALIS) 5 MG tablet      Has the patient contacted their pharmacy? Yes - switching pharmacies (Agent: If no, request that the patient contact the pharmacy for the refill.) (Agent: If yes, when and what did the pharmacy advise?)  Preferred Pharmacy (with phone number or street name):  [place], [place] - [address] [phone] (Phone) [phone] (Fax)   Agent: Please be advised that RX refills may take up to 3 business days. We ask that you follow-up with your pharmacy.

## 2019-03-21 NOTE — Telephone Encounter (Signed)
Patient wants to return to work this week. Should this message be sent to Dr. Caryn Section, or is this something you can approve?  From [facility]  Copied from [place] [id number]. Topic: General - Inquiry >> Mar 21, 2019 10:44 AM Todd Potter wrote: Reason for CRM:   Pt states that he needs a return to work note.  Pt is wanting to go back to work this week. Pt can be reached at [phone]

## 2019-03-21 NOTE — Telephone Encounter (Signed)
Dr. Caryn Section is out of the office until 03/26/2019. Please review PEC message below.

## 2019-03-22 ENCOUNTER — Ambulatory Visit: Payer: Medicare Other | Admitting: Physical Therapy

## 2019-03-22 ENCOUNTER — Encounter: Payer: Medicare Other | Admitting: Occupational Therapy

## 2019-03-22 NOTE — Telephone Encounter (Signed)
Please review message from [facility]

## 2019-03-22 NOTE — Telephone Encounter (Signed)
So the note from the [facility] says that this is the wrong pharmacy.  Can we try to figure out what the correct pharmacy is and then I can get the refills sent. Thanks!

## 2019-03-22 NOTE — Telephone Encounter (Signed)
Called patient no answer and patient was advised to call the office back.

## 2019-03-22 NOTE — Telephone Encounter (Signed)
Pt called back.  States work note needs to say that he is able to drive and preform his duties.  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 Pt can be reached at [phone].

## 2019-03-23 ENCOUNTER — Telehealth: Payer: Self-pay | Admitting: Family Medicine

## 2019-03-23 NOTE — Telephone Encounter (Signed)
I don't think I have the paper with the fax number anymore. Please call the patient and find out the name of the pharmacy and/or the number

## 2019-03-23 NOTE — Telephone Encounter (Signed)
Please call the patient about this, I don't have the number and don't know what pharmacy he needs them sent to.

## 2019-03-23 NOTE — Telephone Encounter (Signed)
Patient states he will will find the name and number to the mail order pharmacy and call back Monday with that information.

## 2019-03-23 NOTE — Telephone Encounter (Signed)
Please call patient about refill message. I do not have the fax number for his pharmacy

## 2019-03-26 ENCOUNTER — Encounter: Payer: Medicare Other | Admitting: Occupational Therapy

## 2019-03-26 ENCOUNTER — Ambulatory Visit: Payer: Medicare Other | Admitting: Physical Therapy

## 2019-03-26 NOTE — Telephone Encounter (Signed)
That's fine, he can have a note returning to work without restrictions immediately.

## 2019-03-27 ENCOUNTER — Telehealth: Payer: Self-pay | Admitting: Family Medicine

## 2019-03-27 MED ORDER — TADALAFIL 5 MG PO TABS: 5.0000 mg | ORAL_TABLET | ORAL | 4 refills | Status: DC | PRN

## 2019-03-27 MED ORDER — ATORVASTATIN CALCIUM 80 MG PO TABS: 80.0000 mg | ORAL_TABLET | Freq: Every day | ORAL | 4 refills | Status: DC

## 2019-03-27 MED ORDER — JANUMET 50-1000 MG PO TABS: 1.0000 | ORAL_TABLET | Freq: Two times a day (BID) | ORAL | 4 refills | Status: DC

## 2019-03-27 MED ORDER — JARDIANCE 25 MG PO TABS: ORAL_TABLET | ORAL | 4 refills | Status: DC

## 2019-03-27 MED ORDER — NITROGLYCERIN 0.4 MG SL SUBL: 0.4000 mg | SUBLINGUAL_TABLET | SUBLINGUAL | 0 refills | Status: DC | PRN

## 2019-03-27 MED ORDER — AMLODIPINE BESYLATE 10 MG PO TABS: 10.0000 mg | ORAL_TABLET | Freq: Every day | ORAL | 4 refills | Status: DC

## 2019-03-27 MED ORDER — METOPROLOL SUCCINATE ER 200 MG PO TB24: 200.0000 mg | ORAL_TABLET | Freq: Every day | ORAL | 4 refills | Status: DC

## 2019-03-27 MED ORDER — LOSARTAN POTASSIUM 100 MG PO TABS: 100.0000 mg | ORAL_TABLET | Freq: Every day | ORAL | 4 refills | Status: DC

## 2019-03-27 MED ORDER — ASPIRIN 325 MG PO TBEC: 325.0000 mg | DELAYED_RELEASE_TABLET | Freq: Every day | ORAL | 4 refills | Status: DC

## 2019-03-27 MED ORDER — GLIPIZIDE ER 5 MG PO TB24: 5.0000 mg | ORAL_TABLET | Freq: Every day | ORAL | 4 refills | Status: DC

## 2019-03-27 NOTE — Telephone Encounter (Signed)
Work note printed at front desk for pick up. Left patient a message advising him that the letter is ready for pick up.

## 2019-03-27 NOTE — Telephone Encounter (Signed)
Patient is trying [facility] to get his medications now.  Please send all his prescription refills to them. He said it was time for refills.

## 2019-03-27 NOTE — Telephone Encounter (Signed)
[facility] is [facility]

## 2019-03-28 ENCOUNTER — Ambulatory Visit: Payer: Medicare Other | Admitting: Physical Therapy

## 2019-03-28 ENCOUNTER — Encounter: Payer: Medicare Other | Admitting: Occupational Therapy

## 2019-03-30 ENCOUNTER — Other Ambulatory Visit: Payer: Self-pay | Admitting: Family Medicine

## 2019-03-30 MED ORDER — NITROGLYCERIN 0.4 MG SL SUBL: 0.4000 mg | SUBLINGUAL_TABLET | SUBLINGUAL | 0 refills | Status: DC | PRN

## 2019-04-02 ENCOUNTER — Ambulatory Visit: Payer: Medicare Other | Admitting: Physical Therapy

## 2019-04-04 ENCOUNTER — Ambulatory Visit: Payer: Medicare Other | Admitting: Physical Therapy

## 2019-04-04 ENCOUNTER — Encounter: Payer: Medicare Other | Admitting: Occupational Therapy

## 2019-04-06 ENCOUNTER — Telehealth: Payer: Self-pay | Admitting: Family Medicine

## 2019-04-06 NOTE — Telephone Encounter (Signed)
Pt wants to know if he can come pick up samples of  empagliflozin (JARDIANCE) 25 MG TABS tablet sitaGLIPtin-metformin (JANUMET) 50-1000 MG tablet

## 2019-04-06 NOTE — Telephone Encounter (Signed)
He can have one month supply

## 2019-04-06 NOTE — Telephone Encounter (Signed)
Patient advised we only have Jardiance 25mg . Samples ready for pt to pick up.

## 2019-04-09 ENCOUNTER — Encounter: Payer: Medicare Other | Admitting: Occupational Therapy

## 2019-04-09 ENCOUNTER — Ambulatory Visit: Payer: Medicare Other | Admitting: Physical Therapy

## 2019-04-11 ENCOUNTER — Ambulatory Visit: Payer: Medicare Other | Admitting: Physical Therapy

## 2019-04-11 ENCOUNTER — Encounter: Payer: Medicare Other | Admitting: Occupational Therapy

## 2019-04-13 ENCOUNTER — Other Ambulatory Visit: Payer: Self-pay | Admitting: Family Medicine

## 2019-04-13 DIAGNOSIS — E0821 Diabetes mellitus due to underlying condition with diabetic nephropathy: Secondary | ICD-10-CM

## 2019-04-14 MED ORDER — GLIPIZIDE ER 5 MG PO TB24: 5.0000 mg | ORAL_TABLET | Freq: Every day | ORAL | 4 refills | Status: DC

## 2019-04-16 ENCOUNTER — Ambulatory Visit: Payer: Medicare Other | Admitting: Physical Therapy

## 2019-04-16 ENCOUNTER — Encounter: Payer: Medicare Other | Admitting: Occupational Therapy

## 2019-04-18 ENCOUNTER — Encounter: Payer: Medicare Other | Admitting: Occupational Therapy

## 2019-04-18 ENCOUNTER — Ambulatory Visit: Payer: Medicare Other | Admitting: Physical Therapy

## 2019-05-03 ENCOUNTER — Telehealth: Payer: Self-pay

## 2019-05-03 NOTE — Telephone Encounter (Signed)
He can have a month's samples if we have them

## 2019-05-03 NOTE — Telephone Encounter (Signed)
Copied from [place] [phone]. Topic: General - Inquiry >> May 03, 2019 11:13 AM Mathis Bud wrote: Reason for CRM: Patient is requesting if PCP has any more samples of Jardiance.   Call back [phone]

## 2019-05-03 NOTE — Telephone Encounter (Signed)
Is it okay to give if available?  Thanks,   -Nyara Capell  

## 2019-05-04 ENCOUNTER — Telehealth: Payer: Self-pay

## 2019-05-04 NOTE — Telephone Encounter (Signed)
Patient advised that we are out of samples.

## 2019-05-04 NOTE — Telephone Encounter (Signed)
Patient advised me that he needs all his prescriptions sent into [facility] mail order [facility]. He states his insurance changed to [employer] and the pharmacy will not send his prescriptions because his old insurance was attached to those prescriptions. I call and spoke with pharmacy and was advised that patient needs to update his insurance with them before we send in the new prescriptions. I advised patient and he agrees to call pharmacy and up date insurance. Patient will call our office back to let us know when to resend refills.

## 2019-05-25 NOTE — Telephone Encounter (Signed)
Opened in error. KW °

## 2019-06-01 ENCOUNTER — Other Ambulatory Visit: Payer: Self-pay

## 2019-06-01 DIAGNOSIS — E0821 Diabetes mellitus due to underlying condition with diabetic nephropathy: Secondary | ICD-10-CM

## 2019-06-01 MED ORDER — NITROGLYCERIN 0.4 MG SL SUBL: 0.4000 mg | SUBLINGUAL_TABLET | SUBLINGUAL | 0 refills | Status: DC | PRN

## 2019-06-01 MED ORDER — TADALAFIL 5 MG PO TABS: 5.0000 mg | ORAL_TABLET | ORAL | 4 refills | Status: DC | PRN

## 2019-06-01 MED ORDER — GLIPIZIDE ER 5 MG PO TB24: 5.0000 mg | ORAL_TABLET | Freq: Every day | ORAL | 4 refills | Status: DC

## 2019-06-01 MED ORDER — METOPROLOL SUCCINATE ER 200 MG PO TB24: 200.0000 mg | ORAL_TABLET | Freq: Every day | ORAL | 4 refills | Status: DC

## 2019-06-01 MED ORDER — LOSARTAN POTASSIUM 100 MG PO TABS: 100.0000 mg | ORAL_TABLET | Freq: Every day | ORAL | 4 refills | Status: DC

## 2019-06-01 MED ORDER — JARDIANCE 25 MG PO TABS: ORAL_TABLET | ORAL | 4 refills | Status: DC

## 2019-06-01 MED ORDER — JANUMET 50-1000 MG PO TABS: 1.0000 | ORAL_TABLET | Freq: Two times a day (BID) | ORAL | 4 refills | Status: DC

## 2019-06-01 MED ORDER — ATORVASTATIN CALCIUM 80 MG PO TABS: 80.0000 mg | ORAL_TABLET | Freq: Every day | ORAL | 4 refills | Status: DC

## 2019-06-01 MED ORDER — AMLODIPINE BESYLATE 10 MG PO TABS: 10.0000 mg | ORAL_TABLET | Freq: Every day | ORAL | 4 refills | Status: DC

## 2019-06-01 NOTE — Telephone Encounter (Signed)
Copied from [place] [id number]. Topic: General - Other >> Jun 01, 2019  4:14 PM Antonieta Iba C wrote: Reason for CRM: pt is requesting a refill for all of his medications. Pt says that he has [facility].    [facility]  [phone] - Phone [phone]- fax

## 2019-06-03 ENCOUNTER — Ambulatory Visit: Payer: Medicare HMO | Attending: Internal Medicine

## 2019-06-03 DIAGNOSIS — Z23 Encounter for immunization: Secondary | ICD-10-CM | POA: Insufficient documentation

## 2019-06-03 NOTE — Progress Notes (Signed)
   Covid-19 Vaccination Clinic  Name:  [name]    MRN: [id number] DOB: 1950-12-16  06/03/2019  Todd Potter was observed post Covid-19 immunization for 15 minutes without incidence. He was provided with Vaccine Information Sheet and instruction to access the V-Safe system.   Todd Potter was instructed to call 911 with any severe reactions post vaccine: Marland Kitchen Difficulty breathing  . Swelling of your face and throat  . A fast heartbeat  . A bad rash all over your body  . Dizziness and weakness    Immunizations Administered    Name Date Dose VIS Date Route   Pfizer COVID-19 Vaccine 06/03/2019  8:16 AM 0.3 mL 03/30/2019 Intramuscular   Manufacturer: [facility]   Lot: [id number]   [place]: [id number]

## 2019-06-15 ENCOUNTER — Other Ambulatory Visit: Payer: Self-pay | Admitting: Family Medicine

## 2019-06-15 MED ORDER — AMLODIPINE BESYLATE 10 MG PO TABS: 10.0000 mg | ORAL_TABLET | Freq: Every day | ORAL | 4 refills | Status: DC

## 2019-06-26 ENCOUNTER — Ambulatory Visit: Payer: Medicare HMO | Attending: Internal Medicine

## 2019-06-26 DIAGNOSIS — Z23 Encounter for immunization: Secondary | ICD-10-CM

## 2019-06-26 NOTE — Progress Notes (Signed)
   Covid-19 Vaccination Clinic  Name:  Todd Potter    MRN: [id number] DOB: 1951-01-30  06/26/2019  [name] was observed post Covid-19 immunization for 15 minutes without incident. He was provided with Vaccine Information Sheet and instruction to access the V-Safe system.   [name] was instructed to call 911 with any severe reactions post vaccine: Marland Kitchen Difficulty breathing  . Swelling of face and throat  . A fast heartbeat  . A bad rash all over body  . Dizziness and weakness   Immunizations Administered    Name Date Dose VIS Date Route   Pfizer COVID-19 Vaccine 06/26/2019  3:08 PM 0.3 mL 03/30/2019 Intramuscular   Manufacturer: [place]   Lot: [id number]   [place]: [id number]

## 2019-06-27 NOTE — Progress Notes (Signed)
Patient: Todd Potter Male    DOB: [birth year]   69 y.o.   MRN: [id number] Visit Date: 06/29/2019  Today's Provider: [name], MD   Chief Complaint  Patient presents with  . Diabetes  . Hypertension   Subjective:     HPI  Diabetes Mellitus Type II, Follow-up:   Lab Results  Component Value Date   HGBA1C 6.1 (A) 03/02/2019   HGBA1C 7.1 (H) 11/18/2018   HGBA1C 7.4 (A) 08/25/2018    Last seen for diabetes 4 months ago.  Management since then includes decreasing Glipizide from 10mg  to 5mg  daily. He reports good compliance with treatment. He is not having side effects.  Current symptoms include none Home blood sugar records: not being checked  Episodes of hypoglycemia? no   Current insulin regiment: Is not on insulin Most Recent Eye Exam: 02/28/2019 Weight trend: stable Prior visit with dietician: No Current exercise: walking Current diet habits: balanced   Pertinent Labs:    Component Value Date/Time   CHOL 120 11/18/2018 0618   CHOL 125 03/02/2018 0853   TRIG 91 11/18/2018 0618   HDL 31 (L) 11/18/2018 0618   HDL 40 03/02/2018 0853   LDLCALC 71 11/18/2018 0618   LDLCALC 66 03/02/2018 0853   CREATININE 1.23 03/02/2019 0910    Wt Readings from Last 3 Encounters:  06/29/19 235 lb 3.2 oz (106.7 kg)  03/02/19 239 lb 3.2 oz (108.5 kg)  11/17/18 234 lb (106.1 kg)    ------------------------------------------------------------------------  Hypertension, follow-up:  BP Readings from Last 3 Encounters:  06/29/19 118/72  03/02/19 108/74  12/20/18 (!) 150/76    He was last seen for hypertension 4 months ago.  BP at that visit was 108/74. Management since that visit includes no changes. He reports good compliance with treatment. He is not having side effects.  He is exercising. He is adherent to low salt diet.   Outside blood pressures are not being checked at home. He is experiencing none.  Patient denies chest pain, chest  pressure/discomfort, irregular heart beat and palpitations.   Cardiovascular risk factors include advanced age (older than 2 for men, 71 for women), diabetes mellitus, hypertension and male gender.  Use of agents associated with hypertension: NSAIDS.     Weight trend: stable Wt Readings from Last 3 Encounters:  06/29/19 235 lb 3.2 oz (106.7 kg)  03/02/19 239 lb 3.2 oz (108.5 kg)  11/17/18 234 lb (106.1 kg)    Current diet: balanced  ------------------------------------------------------------------------  No Known Allergies   Current Outpatient Medications:  .  amLODipine (NORVASC) 10 MG tablet, Take 1 tablet (10 mg total) by mouth daily., Disp: 90 tablet, Rfl: 4 .  aspirin 325 MG EC tablet, Take 1 tablet (325 mg total) by mouth daily., Disp: 90 tablet, Rfl: 4 .  atorvastatin (LIPITOR) 80 MG tablet, Take 1 tablet (80 mg total) by mouth daily., Disp: 90 tablet, Rfl: 4 .  empagliflozin (JARDIANCE) 25 MG TABS tablet, TAKE 25 MG BY MOUTH DAILY. (NOT COVER- CONTACTING MD), Disp: 90 tablet, Rfl: 4 .  glipiZIDE (GLUCOTROL XL) 5 MG 24 hr tablet, Take 1 tablet (5 mg total) by mouth daily with breakfast., Disp: 90 tablet, Rfl: 4 .  losartan (COZAAR) 100 MG tablet, Take 1 tablet (100 mg total) by mouth daily., Disp: 90 tablet, Rfl: 4 .  metoprolol (TOPROL-XL) 200 MG 24 hr tablet, Take 1 tablet (200 mg total) by mouth daily., Disp: 90 tablet, Rfl: 4 .  nitroGLYCERIN (NITROSTAT)  0.4 MG SL tablet, Place 1 tablet (0.4 mg total) under the tongue every 5 (five) minutes as needed for chest pain. 3-5 minutes x3 as nedded, Disp: 25 tablet, Rfl: 0 .  sitaGLIPtin-metformin (JANUMET) 50-1000 MG tablet, Take 1 tablet by mouth 2 (two) times daily., Disp: 180 tablet, Rfl: 4 .  tadalafil (CIALIS) 5 MG tablet, Take 1 tablet (5 mg total) by mouth every other day as needed for erectile dysfunction., Disp: 90 tablet, Rfl: 4  Review of Systems  Constitutional: Negative for appetite change, chills and fever.    Respiratory: Negative for chest tightness, shortness of breath and wheezing.   Cardiovascular: Negative for chest pain and palpitations.  Gastrointestinal: Negative for abdominal pain, nausea and vomiting.    Social History   Tobacco Use  . Smoking status: Former Smoker    Packs/day: 0.50    Years: 30.00    Pack years: 15.00    Types: Cigarettes    Quit date: 04/19/2006    Years since quitting: 13.2  . Smokeless tobacco: Never Used  Substance Use Topics  . Alcohol use: Not Currently    Alcohol/week: 0.0 standard drinks      Objective:   BP 118/72 (BP Location: Right Arm, Patient Position: Sitting, Cuff Size: Normal)   Pulse 68   Temp (!) 96.2 F (35.7 C) (Temporal)   Wt 235 lb 3.2 oz (106.7 kg)   BMI 37.39 kg/m  Vitals:   06/29/19 0818  BP: 118/72  Pulse: 68  Temp: (!) 96.2 F (35.7 C)  TempSrc: Temporal  Weight: 235 lb 3.2 oz (106.7 kg)  Body mass index is 37.39 kg/m.   Physical Exam   General: Appearance:    Mildly obese male in no acute distress  Eyes:    PERRL, conjunctiva/corneas clear, EOM's intact       Lungs:     Clear to auscultation bilaterally, respirations unlabored  Heart:    Normal heart rate. Normal rhythm. No murmurs, rubs, or gallops.   MS:   All extremities are intact.   Neurologic:   Awake, alert, oriented x 3. No apparent focal neurological           defect.        Results for orders placed or performed in visit on 06/29/19  POCT HgB A1C  Result Value Ref Range   Hemoglobin A1C 6.1 (A) 4.0 - 5.6 %   Est. average glucose Bld gHb Est-mCnc 128       Assessment & Plan    1. Diabetes mellitus due to underlying condition, controlled, with diabetic nephropathy, without long-term current use of insulin ([place]) Well controlled.  Continue current medications.  Follow up 4 months.     The entirety of the information documented in the History of Present Illness, Review of Systems and Physical Exam were personally obtained by me. Portions of this  information were initially documented by Meyer Cory, CMA and reviewed by me for thoroughness and accuracy.    [name], MD  [facility]

## 2019-06-29 ENCOUNTER — Ambulatory Visit (INDEPENDENT_AMBULATORY_CARE_PROVIDER_SITE_OTHER): Payer: Medicare HMO | Admitting: Family Medicine

## 2019-06-29 ENCOUNTER — Other Ambulatory Visit: Payer: Self-pay

## 2019-06-29 ENCOUNTER — Encounter: Payer: Self-pay | Admitting: Family Medicine

## 2019-06-29 VITALS — BP 118/72 | HR 68 | Temp 96.2°F | Wt 235.2 lb

## 2019-06-29 DIAGNOSIS — I10 Essential (primary) hypertension: Secondary | ICD-10-CM

## 2019-06-29 DIAGNOSIS — E0821 Diabetes mellitus due to underlying condition with diabetic nephropathy: Secondary | ICD-10-CM | POA: Diagnosis not present

## 2019-06-29 LAB — POCT GLYCOSYLATED HEMOGLOBIN (HGB A1C)
Est. average glucose Bld gHb Est-mCnc: 128
Hemoglobin A1C: 6.1 % — AB (ref 4.0–5.6)

## 2019-07-05 ENCOUNTER — Other Ambulatory Visit: Payer: Self-pay | Admitting: Family Medicine

## 2019-07-05 DIAGNOSIS — E0821 Diabetes mellitus due to underlying condition with diabetic nephropathy: Secondary | ICD-10-CM

## 2019-07-10 ENCOUNTER — Other Ambulatory Visit: Payer: Self-pay

## 2019-07-10 MED ORDER — METOPROLOL SUCCINATE ER 200 MG PO TB24: 200.0000 mg | ORAL_TABLET | Freq: Every day | ORAL | 4 refills | Status: DC

## 2019-08-07 ENCOUNTER — Telehealth: Payer: Self-pay

## 2019-08-07 ENCOUNTER — Other Ambulatory Visit: Payer: Self-pay | Admitting: Family Medicine

## 2019-08-07 DIAGNOSIS — I10 Essential (primary) hypertension: Secondary | ICD-10-CM

## 2019-08-07 DIAGNOSIS — E0821 Diabetes mellitus due to underlying condition with diabetic nephropathy: Secondary | ICD-10-CM

## 2019-08-07 DIAGNOSIS — E7800 Pure hypercholesterolemia, unspecified: Secondary | ICD-10-CM

## 2019-08-07 MED ORDER — METOPROLOL SUCCINATE ER 200 MG PO TB24: 200.0000 mg | ORAL_TABLET | Freq: Every day | ORAL | 1 refills | Status: DC

## 2019-08-07 MED ORDER — JARDIANCE 25 MG PO TABS: ORAL_TABLET | ORAL | 1 refills | Status: DC

## 2019-08-07 MED ORDER — ATORVASTATIN CALCIUM 80 MG PO TABS: 80.0000 mg | ORAL_TABLET | Freq: Every day | ORAL | 1 refills | Status: DC

## 2019-08-07 MED ORDER — LOSARTAN POTASSIUM 100 MG PO TABS: 100.0000 mg | ORAL_TABLET | Freq: Every day | ORAL | 1 refills | Status: DC

## 2019-08-07 MED ORDER — GLIPIZIDE ER 5 MG PO TB24: 5.0000 mg | ORAL_TABLET | Freq: Every day | ORAL | 1 refills | Status: DC

## 2019-08-07 MED ORDER — JANUMET 50-1000 MG PO TABS: 1.0000 | ORAL_TABLET | Freq: Two times a day (BID) | ORAL | 1 refills | Status: DC

## 2019-08-07 NOTE — Telephone Encounter (Signed)
Patient advised samples are ready for pick up.  

## 2019-08-07 NOTE — Telephone Encounter (Signed)
[facility] faxed refill request for the following medications:  JANUMET 50-1000 MG tablet empagliflozin (JARDIANCE) 25 MG TABS tablet amLODipine (NORVASC) 10 MG tablet losartan (COZAAR) 100 MG tablet glipiZIDE (GLUCOTROL XL) 5 MG 24 hr tablet atorvastatin (LIPITOR) 80 MG tablet metoprolol (TOPROL-XL) 200 MG 24 hr tablet   Please advise.  Thanks, [employer]

## 2019-08-07 NOTE — Telephone Encounter (Signed)
Copied from [place] [phone]. Topic: General - Other >> Aug 07, 2019 11:15 AM Rainey Pines A wrote: Patient would like a callback from nurse on if he can have some samples of jardiance and janumet. Please advise

## 2019-08-07 NOTE — Telephone Encounter (Signed)
We have several sample bottles of Jardiance 25mg , but dont have any Janumet 50-1000mg . Please advise if ok to give samples of Jardiance. I reserved 4 bottles of Jardiance.

## 2019-08-07 NOTE — Telephone Encounter (Signed)
That's good, he can have the 4 bottles of Jardiance 25mg 

## 2019-08-10 ENCOUNTER — Other Ambulatory Visit: Payer: Self-pay | Admitting: Family Medicine

## 2019-08-10 DIAGNOSIS — I10 Essential (primary) hypertension: Secondary | ICD-10-CM

## 2019-08-21 ENCOUNTER — Other Ambulatory Visit: Payer: Self-pay | Admitting: Family Medicine

## 2019-08-21 DIAGNOSIS — I10 Essential (primary) hypertension: Secondary | ICD-10-CM

## 2019-08-21 MED ORDER — LOSARTAN POTASSIUM 100 MG PO TABS: 100.0000 mg | ORAL_TABLET | Freq: Every day | ORAL | 1 refills | Status: DC

## 2019-08-21 NOTE — Telephone Encounter (Signed)
[facility] faxed refill request for the following medications:   losartan (COZAAR) 100 MG tablet   Please advise.  Thanks, [employer]

## 2019-08-22 ENCOUNTER — Other Ambulatory Visit: Payer: Self-pay | Admitting: Family Medicine

## 2019-08-22 DIAGNOSIS — E0821 Diabetes mellitus due to underlying condition with diabetic nephropathy: Secondary | ICD-10-CM

## 2019-08-22 MED ORDER — GLIPIZIDE ER 5 MG PO TB24: 5.0000 mg | ORAL_TABLET | Freq: Every day | ORAL | 3 refills | Status: DC

## 2019-08-24 ENCOUNTER — Other Ambulatory Visit: Payer: Self-pay | Admitting: Family Medicine

## 2019-08-24 DIAGNOSIS — I10 Essential (primary) hypertension: Secondary | ICD-10-CM

## 2019-09-03 ENCOUNTER — Telehealth: Payer: Self-pay

## 2019-09-03 NOTE — Telephone Encounter (Signed)
Copied from [place] [phone]. Topic: General - Call Back - No Documentation >> Sep 03, 2019 11:07 AM Erick Blinks wrote: Best contact: [phone] Jardiance and Janumet 50/1000, Pt is requesting samples of these medications. Please advise if possible.

## 2019-09-03 NOTE — Telephone Encounter (Signed)
Samples are at the front desk ready for pick up. Patient advised.

## 2019-09-03 NOTE — Telephone Encounter (Signed)
He can have one months samples Jardiance 25 and Janumet 50/1000. thanks

## 2019-09-08 ENCOUNTER — Other Ambulatory Visit: Payer: Self-pay | Admitting: Family Medicine

## 2019-09-08 DIAGNOSIS — I10 Essential (primary) hypertension: Secondary | ICD-10-CM

## 2019-09-08 NOTE — Telephone Encounter (Signed)
Requested Prescriptions  Pending Prescriptions Disp Refills  . losartan (COZAAR) 100 MG tablet [Pharmacy Med Name: LOSARTAN POTASSIUM 100 MG TAB] 30 tablet 0    Sig: TAKE 1 TABLET BY MOUTH EVERY DAY     Cardiovascular:  Angiotensin Receptor Blockers Failed - 09/08/2019 10:18 AM      Failed - Cr in normal range and within 180 days    Creatinine, Ser  Date Value Ref Range Status  03/02/2019 1.23 0.76 - 1.27 mg/dL Final   Creatinine, POC  Date Value Ref Range Status  12/03/2016 na mg/dL Final         Failed - K in normal range and within 180 days    Potassium  Date Value Ref Range Status  03/02/2019 3.8 3.5 - 5.2 mmol/L Final         Passed - Patient is not pregnant      Passed - Last BP in normal range    BP Readings from Last 1 Encounters:  06/29/19 118/72         Passed - Valid encounter within last 6 months    Recent Outpatient Visits          2 months ago Diabetes mellitus due to underlying condition, controlled, with diabetic nephropathy, without long-term current use of insulin ([place])   [facility] [name], MD   6 months ago Diabetes mellitus due to underlying condition, controlled, with diabetic nephropathy, without long-term current use of insulin ([place])   [facility] [name], MD   8 months ago Irregular heartbeat   [facility] [name], MD   1 year ago Diabetes mellitus due to underlying condition, controlled, with diabetic nephropathy, without long-term current use of insulin ([place])   [facility] [name], MD   1 year ago Diabetes mellitus due to underlying condition, controlled, with diabetic nephropathy, without long-term current use of insulin [facility])   [facility] [name], MD      Future Appointments            In 3 days Fisher, Kirstie Peri, MD [facility], [place]   In 1 month Fisher, Kirstie Peri, MD [facility], [place] of pharmacy

## 2019-09-10 NOTE — Progress Notes (Signed)
I,Roshena L Chambers,acting as a scribe for [name], MD.,have documented all relevant documentation on the behalf of [name], MD,as directed by  [name], MD while in the presence of [name], MD.   Established patient visit   Patient: Todd Potter   DOB: 1950-09-13   69 y.o. Male  MRN: [id number] Visit Date: 09/11/2019  Today's healthcare provider: [name], MD   Chief Complaint  Patient presents with  . Shoulder Pain    x 3-4 months   Subjective    Shoulder Pain  The pain is present in the left shoulder. This is a new problem. Episode onset: 3-4 months ago. There has been a history of trauma (fell 3 months ago onto his left side ). The problem has been gradually worsening. Associated symptoms include a limited range of motion. Pertinent negatives include no fever, joint swelling, numbness, stiffness or tingling. Exacerbated by: lifting left arm and laying on arm. Treatments tried: Topical Analgesic. The treatment provided no relief.   He did not fall onto shoulder. Tried OTC icy hot. Pain radiates up into back of neck. Took some Advil which helped a little bit. No numbness or tingling, not feeling weak in arm or hand. Has trouble raising above level shoulder.      Medications: Outpatient Medications Prior to Visit  Medication Sig  . amLODipine (NORVASC) 10 MG tablet Take 1 tablet (10 mg total) by mouth daily.  Marland Kitchen aspirin 325 MG EC tablet Take 1 tablet (325 mg total) by mouth daily.  Marland Kitchen atorvastatin (LIPITOR) 80 MG tablet Take 1 tablet (80 mg total) by mouth daily.  . empagliflozin (JARDIANCE) 25 MG TABS tablet TAKE 1 TABLET BY MOUTH EVERY DAY  . glipiZIDE (GLUCOTROL XL) 5 MG 24 hr tablet Take 1 tablet (5 mg total) by mouth daily with breakfast.  . losartan (COZAAR) 100 MG tablet TAKE 1 TABLET BY MOUTH EVERY DAY  . metoprolol (TOPROL-XL) 200 MG 24 hr tablet Take 1 tablet (200 mg total) by mouth daily.  . nitroGLYCERIN (NITROSTAT) 0.4 MG SL tablet  Place 1 tablet (0.4 mg total) under the tongue every 5 (five) minutes as needed for chest pain. 3-5 minutes x3 as nedded  . sitaGLIPtin-metformin (JANUMET) 50-1000 MG tablet Take 1 tablet by mouth 2 (two) times daily.  . tadalafil (CIALIS) 5 MG tablet Take 1 tablet (5 mg total) by mouth every other day as needed for erectile dysfunction.   No facility-administered medications prior to visit.    Review of Systems  Constitutional: Negative for appetite change, chills and fever.  Respiratory: Negative for chest tightness, shortness of breath and wheezing.   Cardiovascular: Negative for chest pain and palpitations.  Gastrointestinal: Negative for abdominal pain, nausea and vomiting.  Musculoskeletal: Positive for arthralgias (left shoulder). Negative for stiffness.  Neurological: Negative for tingling and numbness.      Objective    BP 138/80 (BP Location: Left Arm, Patient Position: Sitting, Cuff Size: Large)   Pulse 70   Temp (!) 97.3 F (36.3 C) (Temporal)   Resp 16   Wt 236 lb (107 kg)   SpO2 98% Comment: room air  BMI 37.52 kg/m    Physical Exam  Slight tenderness across top of left shoulder and into trapezius. No swelling. Positive empty can test. Positive impingement sign. Abduction limited to 90 degrees. Passive internal and external shoulder rotation limited to 45 degrees.    No results found for any visits on 09/11/19.  Assessment & Plan  1. Bursitis of left shoulder Offered steroid injection versus trial of scheduled oral NSAIDs. He prefers to trial oral medications initially and sent prescription for meloxicam 15mg  daily for up to a month. Given printed instructions for shoulder exercises and may return for steroid injection if not improving.    No follow-ups on file.         [name], MD  [facility] [phone] (phone) [phone] (fax)  [place]

## 2019-09-11 ENCOUNTER — Encounter: Payer: Self-pay | Admitting: Family Medicine

## 2019-09-11 ENCOUNTER — Ambulatory Visit (INDEPENDENT_AMBULATORY_CARE_PROVIDER_SITE_OTHER): Payer: Medicare HMO | Admitting: Family Medicine

## 2019-09-11 ENCOUNTER — Other Ambulatory Visit: Payer: Self-pay

## 2019-09-11 VITALS — BP 138/80 | HR 70 | Temp 97.3°F | Resp 16 | Wt 236.0 lb

## 2019-09-11 DIAGNOSIS — M7552 Bursitis of left shoulder: Secondary | ICD-10-CM

## 2019-09-11 MED ORDER — MELOXICAM 15 MG PO TABS: 15.0000 mg | ORAL_TABLET | Freq: Every day | ORAL | 0 refills | Status: DC

## 2019-09-11 NOTE — Patient Instructions (Signed)
. Please review the attached list of medications and notify my office if there are any errors.   . If your shoulder is not much better in two weeks, then return for cortisone injection   Shoulder Range of Motion Exercises Shoulder range of motion (ROM) exercises are done to keep the shoulder moving freely or to increase movement. They are often recommended for people who have shoulder pain or stiffness or who are recovering from a shoulder surgery. Phase 1 exercises When you are able, do this exercise 1-2 times per day for 30-60 seconds in each direction, or as directed by your health care provider. Pendulum exercise To do this exercise while sitting: 1. Sit in a chair or at the edge of your bed with your feet flat on the floor. 2. Let your affected arm hang down in front of you over the edge of the bed or chair. 3. Relax your shoulder, arm, and hand. [place] your body so your arm gently swings in small circles. You can also use your unaffected arm to start the motion. 5. Repeat changing the direction of the circles, swinging your arm left and right, and swinging your arm forward and back. To do this exercise while standing: 1. Stand next to a sturdy chair or table, and hold on to it with your hand on your unaffected side. 2. Bend forward at the waist. 3. Bend your knees slightly. 4. Relax your shoulder, arm, and hand. 5. While keeping your shoulder relaxed, use body motion to swing your arm in small circles. 6. Repeat changing the direction of the circles, swinging your arm left and right, and swinging your arm forward and back. 7. Between exercises, stand up tall and take a short break to relax your lower back.  Phase 2 exercises Do these exercises 1-2 times per day or as told by your health care provider. Hold each stretch for 30 seconds, and repeat 3 times. Do the exercises with one or both arms as instructed by your health care provider. For these exercises, sit at a table with your  hand and arm supported by the table. A chair that slides easily or has wheels can be helpful. External rotation 1. Turn your chair so that your affected side is nearest to the table. 2. Place your forearm on the table to your side. Bend your elbow about 90 at the elbow (right angle) and place your hand palm facing down on the table. Your elbow should be about 6 inches away from your side. 3. Keeping your arm on the table, lean your body forward. Abduction 1. Turn your chair so that your affected side is nearest to the table. 2. Place your forearm and hand on the table so that your thumb points toward the ceiling and your arm is straight out to your side. 3. Slide your hand out to the side and away from you, using your unaffected arm to do the work. 4. To increase the stretch, you can slide your chair away from the table. Flexion: forward stretch 1. Sit facing the table. Place your hand and elbow on the table in front of you. 2. Slide your hand forward and away from you, using your unaffected arm to do the work. 3. To increase the stretch, you can slide your chair backward. Phase 3 exercises Do these exercises 1-2 times per day or as told by your health care provider. Hold each stretch for 30 seconds, and repeat 3 times. Do the exercises with one or  both arms as instructed by your health care provider. Cross-body stretch: posterior capsule stretch 1. Lift your arm straight out in front of you. 2. Bend your arm 90 at the elbow (right angle) so your forearm moves across your body. 3. Use your other arm to gently pull the elbow across your body, toward your other shoulder. Wall climbs 1. Stand with your affected arm extended out to the side with your hand resting on a door frame. 2. Slide your hand slowly up the door frame. 3. To increase the stretch, step through the door frame. Keep your body upright and do not lean. Wand exercises You will need a cane, a piece of PVC pipe, or a sturdy  wooden dowel for wand exercises. Flexion To do this exercise while standing: 1. Hold the wand with both of your hands, palms down. 2. Using the other arm to help, lift your arms up and over your head, if able. 3. Push upward with your other arm to gently increase the stretch. To do this exercise while lying down: 1. Lie on your back with your elbows resting on the floor and the wand in both your hands. Your hands will be palm down, or pointing toward your feet. 2. Lift your hands toward the ceiling, using your unaffected arm to help if needed. 3. Bring your arms overhead as able, using your unaffected arm to help if needed. Internal rotation 1. Stand while holding the wand behind you with both hands. Your unaffected arm should be extended above your head with the arm of the affected side extended behind you at the level of your waist. The wand should be pointing straight up and down as you hold it. 2. Slowly pull the wand up behind your back by straightening the elbow of your unaffected arm and bending the elbow of your affected arm. External rotation 1. Lie on your back with your affected upper arm supported on a small pillow or rolled towel. When you first do this exercise, keep your upper arm close to your body. Over time, bring your arm up to a 90 angle out to the side. 2. Hold the wand across your stomach and with both hands palm up. Your elbow on your affected side should be bent at a 90 angle. 3. Use your unaffected side to help push your forearm away from you and toward the floor. Keep your elbow on your affected side bent at a 90 angle. Contact a health care provider if you have:  New or increasing pain.  New numbness, tingling, weakness, or discoloration in your arm or hand. This information is not intended to replace advice given to you by your health care provider. Make sure you discuss any questions you have with your health care provider. Document Revised: 05/18/2017 Document  Reviewed: 05/18/2017 Elsevier Patient Education  2020 [employer].

## 2019-10-04 ENCOUNTER — Other Ambulatory Visit: Payer: Self-pay | Admitting: Family Medicine

## 2019-10-04 DIAGNOSIS — M7552 Bursitis of left shoulder: Secondary | ICD-10-CM

## 2019-10-04 NOTE — Telephone Encounter (Signed)
Requested  medications are  due for refill today yes  Requested medications are on the active medication list yes  Last refill 5/25   Notes to clinic MD note states this is for one month only.

## 2019-10-15 ENCOUNTER — Other Ambulatory Visit: Payer: Self-pay | Admitting: Family Medicine

## 2019-10-15 DIAGNOSIS — E7800 Pure hypercholesterolemia, unspecified: Secondary | ICD-10-CM

## 2019-11-02 ENCOUNTER — Ambulatory Visit: Payer: Medicare HMO | Admitting: Family Medicine

## 2019-11-21 IMAGING — CT CT HEAD WITHOUT CONTRAST
3 series · 15 of 47 positions shown, 18 images · non-contrast
Comparison: None.

CLINICAL DATA: Stroke suspected, weakness in the left hand and left
foot

EXAM:
CT HEAD WITHOUT CONTRAST
TECHNIQUE: Contiguous axial images were obtained from the base of the skull
through the vertex without intravenous contrast.

[Series 3: head wo · axial · 0.43mm/px · z∈[-125,+0]mm · 9 of 31 slices shown, 12 images]
[im 3/31  brain]
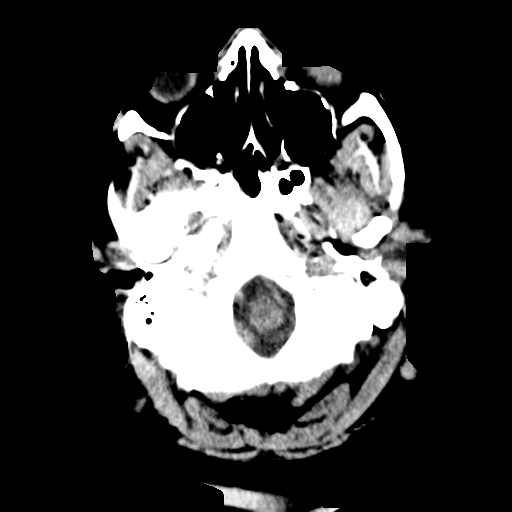
[im 3/31  bone]
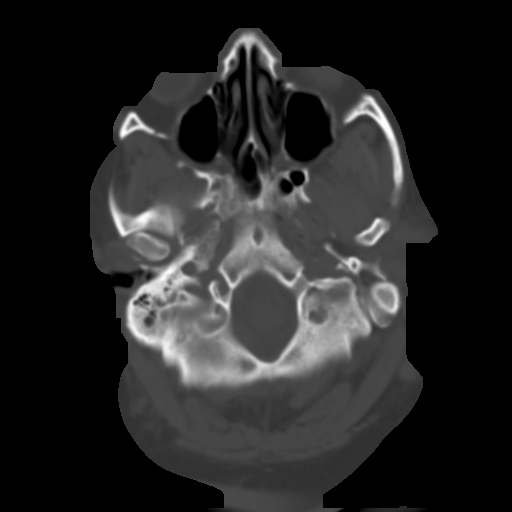
[im 6/31  brain]
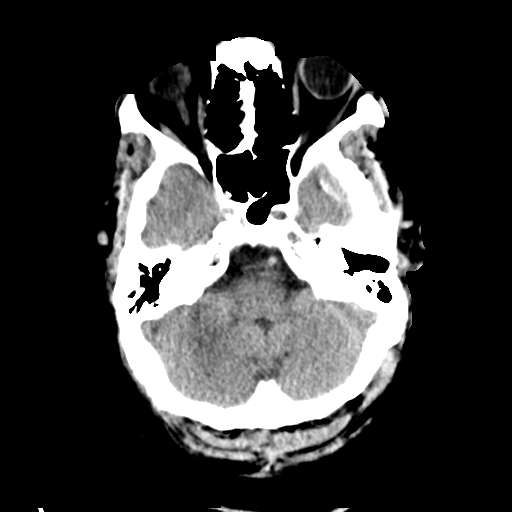
[im 9/31  brain]
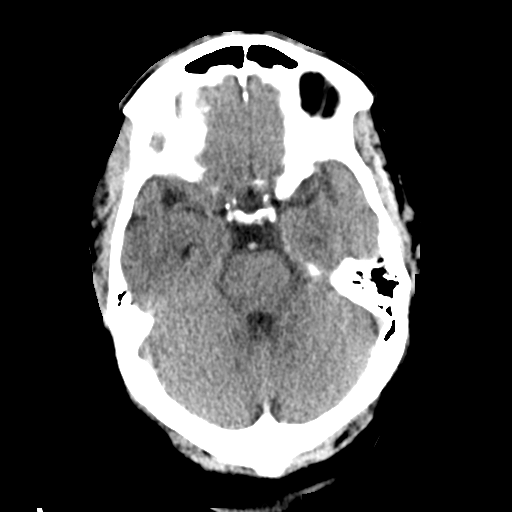
[im 12/31  brain]
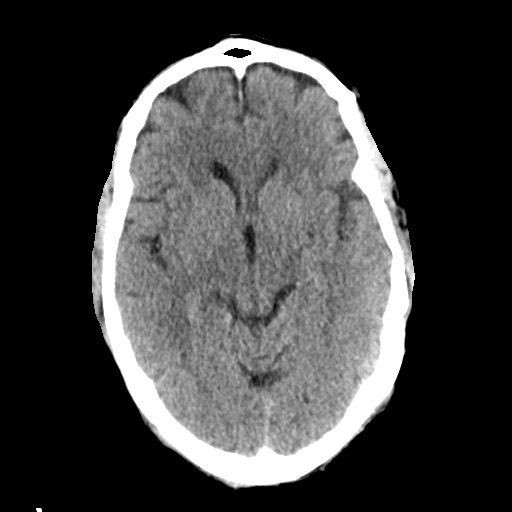
[im 16/31  brain]
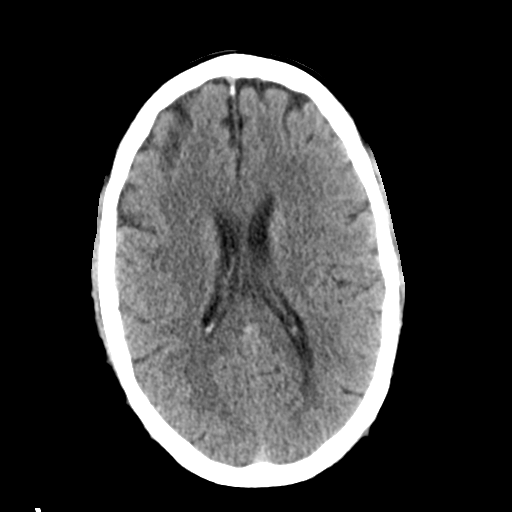
[im 16/31  bone]
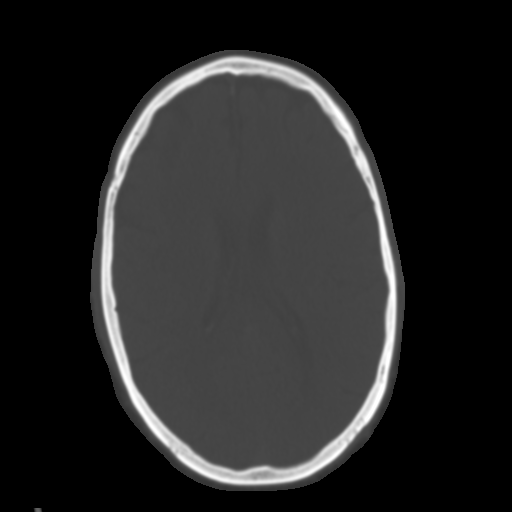
[im 19/31  brain]
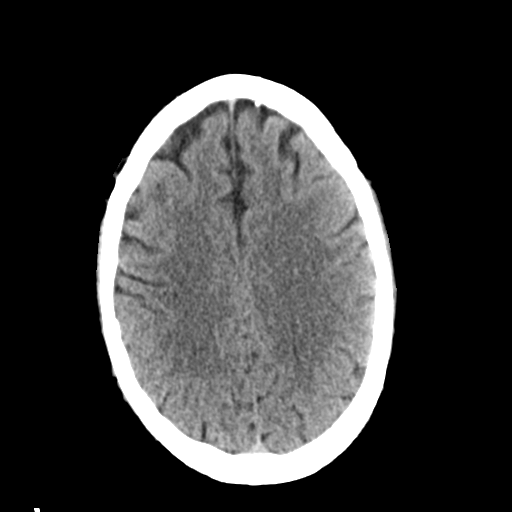
[im 22/31  brain]
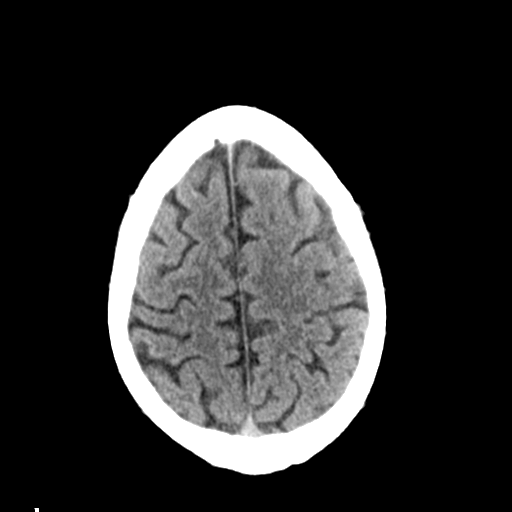
[im 25/31  brain]
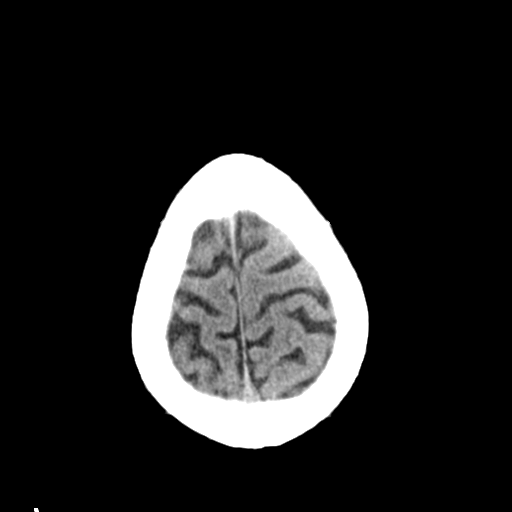
[im 28/31  brain]
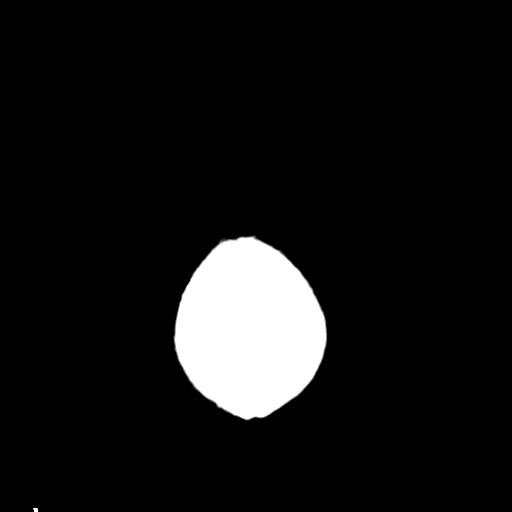
[im 28/31  bone]
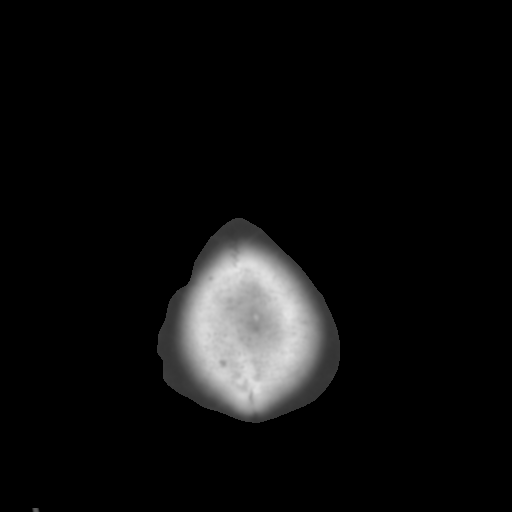

[Series 4: coronal soft tissue · coronal · 0.32mm/px · 3 of 74 slices shown]
[im 25/74  brain]
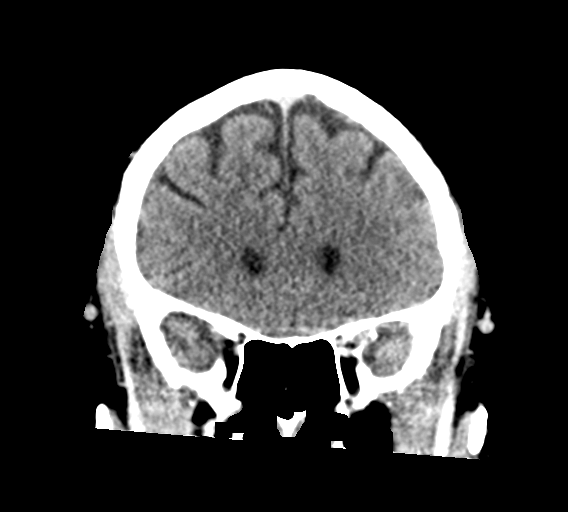
[im 33/74  brain]
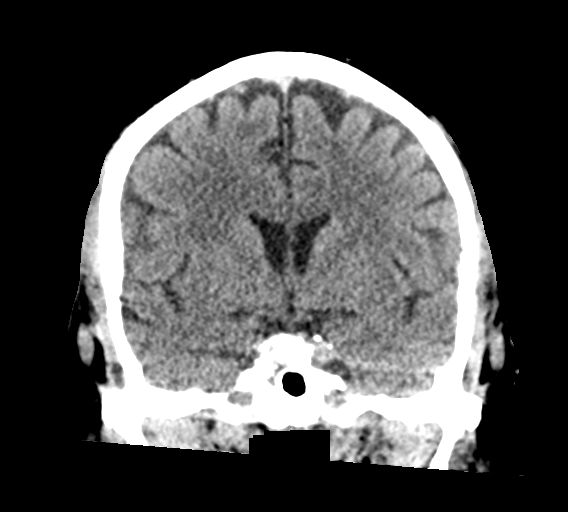
[im 41/74  brain]
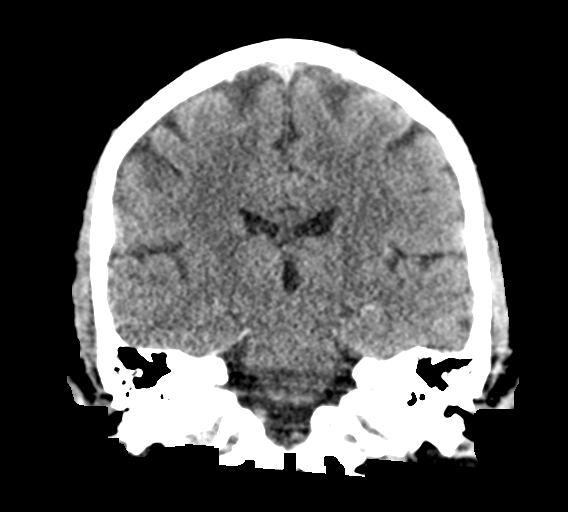

[Series 5: sagittal soft tissue · sagittal · 0.34mm/px · 3 of 53 slices shown]
[im 18/53  brain]
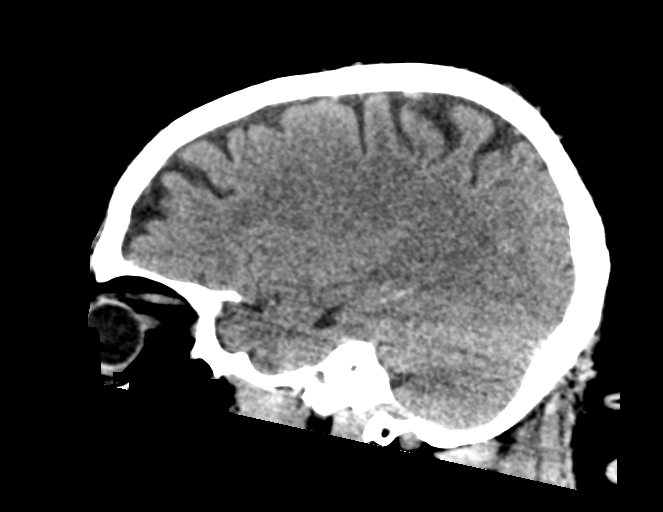
[im 27/53  brain]
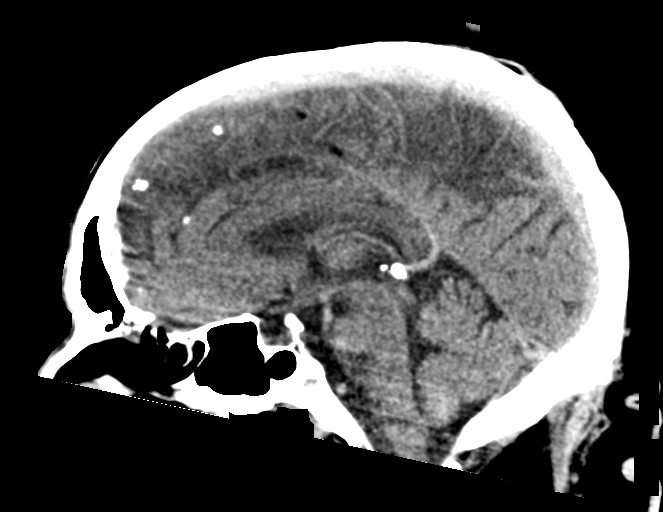
[im 35/53  brain]
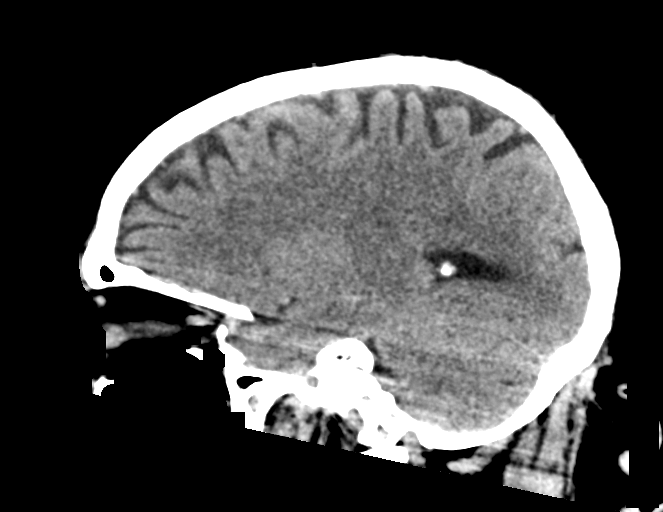

[15 of 47 positions shown; findings below may reference images not displayed]

FINDINGS: Brain: No evidence of acute infarction, hemorrhage, hydrocephalus,
extra-axial collection or mass lesion/mass effect. Mild
periventricular white matter hypodensity.

Vascular: No hyperdense vessel or unexpected calcification.

Skull: Normal. Negative for fracture or focal lesion.

Sinuses/Orbits: No acute finding.

Other: None.
IMPRESSION: No acute intracranial pathology. No non-contrast CT evidence of
acute stroke or hemorrhage. Mild small-vessel white matter disease.
Consider MRI to further evaluate for acute diffusion restricting
infarction if suspected.

## 2019-11-26 ENCOUNTER — Telehealth: Payer: Self-pay | Admitting: Family Medicine

## 2019-11-26 DIAGNOSIS — M7552 Bursitis of left shoulder: Secondary | ICD-10-CM

## 2019-11-26 NOTE — Telephone Encounter (Signed)
Patient stopped by the office today to ask for a referral to orthopedics due to left shoulder pain. He said he had seen you in the past for this and would like to proceed with referral for a cortisone shot.

## 2019-11-26 NOTE — Telephone Encounter (Signed)
Samples at the front desk. Todd Potter will advise patient.   Please advise regarding referral.

## 2019-11-26 NOTE — Telephone Encounter (Signed)
Patient also wants samples of Janumet 50-1000 if we have them. Please let patient know when and if we have them.

## 2019-11-30 ENCOUNTER — Other Ambulatory Visit: Payer: Self-pay | Admitting: Family Medicine

## 2019-11-30 DIAGNOSIS — I10 Essential (primary) hypertension: Secondary | ICD-10-CM

## 2019-11-30 NOTE — Addendum Note (Signed)
Addended by: [name] E on: 11/30/2019 10:26 AM   Modules accepted: Orders

## 2019-11-30 NOTE — Telephone Encounter (Signed)
Requested  medications are  due for refill today yes  Requested medications are on the active medication list yes  Last refill 7/11  Last visit to address HTN 02/2019  Future visit scheduled No  Notes to clinic Failed protocol of valid visit within 6 months.

## 2019-11-30 NOTE — Telephone Encounter (Signed)
Order place for referral to orthopedics

## 2019-11-30 NOTE — Telephone Encounter (Signed)
Patient calling about referral request. He is requesting call back.

## 2019-11-30 NOTE — Telephone Encounter (Signed)
Patient advised Dr. Caryn Section is out of the office until 8/23.

## 2019-12-14 ENCOUNTER — Telehealth: Payer: Self-pay | Admitting: *Deleted

## 2019-12-14 NOTE — Telephone Encounter (Signed)
He can have 4 boxes if we have them 

## 2019-12-14 NOTE — Telephone Encounter (Signed)
Notified patient samples up front for pick up. KW

## 2019-12-14 NOTE — Telephone Encounter (Signed)
Copied from [place] [phone]. Topic: General - Other >> Dec 14, 2019  9:51 AM Leward Quan A wrote: Reason for CRM: Patient called to request samples of empagliflozin (JARDIANCE) 25 MG TABS tablet can be reached at Ph# [phone]  Please advise samples?

## 2019-12-31 ENCOUNTER — Telehealth: Payer: Self-pay | Admitting: Family Medicine

## 2019-12-31 DIAGNOSIS — E0821 Diabetes mellitus due to underlying condition with diabetic nephropathy: Secondary | ICD-10-CM

## 2019-12-31 MED ORDER — JANUMET 50-1000 MG PO TABS: 1.0000 | ORAL_TABLET | Freq: Two times a day (BID) | ORAL | 0 refills | Status: DC

## 2019-12-31 NOTE — Telephone Encounter (Signed)
Patient wants to know if we have any Janumet 50-1000 mg. [place]. He is totally out.  Patient is waiting at front desk to see if he can get them.

## 2019-12-31 NOTE — Telephone Encounter (Signed)
He can have two sample bottles

## 2019-12-31 NOTE — Telephone Encounter (Signed)
We have two samples available left to give, okay to distribute to patient? KW

## 2020-01-04 ENCOUNTER — Other Ambulatory Visit: Payer: Self-pay

## 2020-01-04 ENCOUNTER — Encounter: Payer: Self-pay | Admitting: Family Medicine

## 2020-01-04 ENCOUNTER — Ambulatory Visit (INDEPENDENT_AMBULATORY_CARE_PROVIDER_SITE_OTHER): Payer: Medicare HMO | Admitting: Family Medicine

## 2020-01-04 VITALS — BP 149/80 | HR 67 | Temp 98.1°F | Resp 16 | Ht 67.0 in | Wt 236.0 lb

## 2020-01-04 DIAGNOSIS — Z125 Encounter for screening for malignant neoplasm of prostate: Secondary | ICD-10-CM

## 2020-01-04 DIAGNOSIS — I10 Essential (primary) hypertension: Secondary | ICD-10-CM | POA: Diagnosis not present

## 2020-01-04 DIAGNOSIS — I2510 Atherosclerotic heart disease of native coronary artery without angina pectoris: Secondary | ICD-10-CM | POA: Diagnosis not present

## 2020-01-04 DIAGNOSIS — I6930 Unspecified sequelae of cerebral infarction: Secondary | ICD-10-CM

## 2020-01-04 DIAGNOSIS — E0821 Diabetes mellitus due to underlying condition with diabetic nephropathy: Secondary | ICD-10-CM

## 2020-01-04 DIAGNOSIS — E7800 Pure hypercholesterolemia, unspecified: Secondary | ICD-10-CM

## 2020-01-04 NOTE — Patient Instructions (Signed)
.   Please review the attached list of medications and notify my office if there are any errors.   . Please bring all of your medications to every appointment so we can make sure that our medication list is the same as yours.   

## 2020-01-04 NOTE — Progress Notes (Signed)
I,April Miller,acting as a scribe for [name], MD.,have documented all relevant documentation on the behalf of [name], MD,as directed by  [name], MD while in the presence of [name], MD.   Established patient visit   Patient: Todd Potter   DOB: 1950-08-05   69 y.o. Male  MRN: [id number] Visit Date: 01/04/2020  Today's healthcare provider: [name], MD   No chief complaint on file.  Subjective    HPI  Patient is here for work clearance. Patient had a stroke 11/17/2018. Initial presentation was left sided weakness and incoordination with MRI showing acute subacute ventral medial medullary infarct. Since then has regained most of his strength and has no difficulty with the use of left UE. He was advised at the time to not work for a year. He recently had follow up with his cardiologist and cleared to return to work. He works as Geophysicist/field seismologist transporting patients.   He is also due for follow up lipids, blood pressure and diabetes. Home sugars mostly hover around 100.  Lab Results  Component Value Date   CHOL 120 11/18/2018   HDL 31 (L) 11/18/2018   LDLCALC 71 11/18/2018   TRIG 91 11/18/2018   CHOLHDL 3.9 11/18/2018    Lab Results  Component Value Date   HGBA1C 6.1 (A) 06/29/2019  He is tolerating medications well without adverse effect. Has no chest pain, dyspnea or palpitations.   He continue routine follow up [place] cardiology for CAD     Medications: Outpatient Medications Prior to Visit  Medication Sig  . amLODipine (NORVASC) 10 MG tablet Take 1 tablet (10 mg total) by mouth daily.  Marland Kitchen aspirin 325 MG EC tablet Take 1 tablet (325 mg total) by mouth daily.  Marland Kitchen atorvastatin (LIPITOR) 80 MG tablet TAKE 1 TABLET DAILY  . empagliflozin (JARDIANCE) 25 MG TABS tablet TAKE 1 TABLET BY MOUTH EVERY DAY  . glipiZIDE (GLUCOTROL XL) 5 MG 24 hr tablet Take 1 tablet (5 mg total) by mouth daily with breakfast.  . losartan (COZAAR) 100 MG tablet TAKE 1 TABLET BY  MOUTH EVERY DAY  . meloxicam (MOBIC) 15 MG tablet TAKE 1 TABLET BY MOUTH EVERY DAY  . metoprolol (TOPROL-XL) 200 MG 24 hr tablet Take 1 tablet (200 mg total) by mouth daily.  . nitroGLYCERIN (NITROSTAT) 0.4 MG SL tablet Place 1 tablet (0.4 mg total) under the tongue every 5 (five) minutes as needed for chest pain. 3-5 minutes x3 as nedded  . sitaGLIPtin-metformin (JANUMET) 50-1000 MG tablet Take 1 tablet by mouth 2 (two) times daily.  . tadalafil (CIALIS) 5 MG tablet Take 1 tablet (5 mg total) by mouth every other day as needed for erectile dysfunction.   No facility-administered medications prior to visit.    Review of Systems  Constitutional: Negative for appetite change, chills and fever.  Respiratory: Negative for chest tightness, shortness of breath and wheezing.   Cardiovascular: Negative for chest pain and palpitations.  Gastrointestinal: Negative for abdominal pain, nausea and vomiting.      Objective    BP (!) 149/80 (BP Location: Right Arm, Patient Position: Sitting, Cuff Size: Large)   Pulse 67   Temp 98.1 F (36.7 C) (Oral)   Resp 16   Ht 5\' 7"  (1.702 m)   Wt 236 lb (107 kg)   SpO2 100%   BMI 36.96 kg/m    Physical Exam   General: Appearance:    Obese male in no acute distress  Eyes:  PERRL, conjunctiva/corneas clear, EOM's intact       Lungs:     Clear to auscultation bilaterally, respirations unlabored  Heart:    Normal heart rate. Normal rhythm. No murmurs, rubs, or gallops.   MS:   All extremities are intact.   Neurologic:   Awake, alert, oriented x 3. +5 bilateral UE strength, but slightly weaker throughout left UE. Normal hand coordination. No tremors. Normal tone.        No results found for any visits on 01/04/20.  Assessment & Plan     1. History of cerebrovascular accident (CVA) with residual deficit Now 13 months post CVA. Minimal weakness of left UE. No restrictions for work or driving. Patient given letter to return work. Continue aggressive  risk factor modification.    2. Diabetes mellitus due to underlying condition, controlled, with diabetic nephropathy, without long-term current use of insulin ([place]) Doing well current medications.  - Hemoglobin A1c  3. Essential hypertension SBP a bit elevated. Same meds for now. Was 130/80 at recent cardiology visit.   4. Coronary artery disease involving native coronary artery of native heart without angina pectoris Asymptomatic. Compliant with medication.  Continue aggressive risk factor modification.  Continue routine follow up [facility] cardiology.   5. Pure hypercholesterolemia He is tolerating atorvastatin well with no adverse effects.   - CBC - Comprehensive metabolic panel - Lipid panel  6. Prostate cancer screening  - PSA Total (Reflex To Free) ([facility] only)   7. Morbid Obesity Work on increasing physical activity.   He anticipates getting flu shot at work.       The entirety of the information documented in the History of Present Illness, Review of Systems and Physical Exam were personally obtained by me. Portions of this information were initially documented by the CMA and reviewed by me for thoroughness and accuracy.      [name], MD  [facility] [phone] (phone) [phone] (fax)  [place]

## 2020-01-08 LAB — HEMOGLOBIN A1C
Est. average glucose Bld gHb Est-mCnc: 143 mg/dL
Hgb A1c MFr Bld: 6.6 % — ABNORMAL HIGH (ref 4.8–5.6)

## 2020-01-08 LAB — PSA TOTAL (REFLEX TO FREE): Prostate Specific Ag, Serum: 1 ng/mL (ref 0.0–4.0)

## 2020-01-08 LAB — COMPREHENSIVE METABOLIC PANEL
ALT: 21 IU/L (ref 0–44)
AST: 16 IU/L (ref 0–40)
Albumin/Globulin Ratio: 1 — ABNORMAL LOW (ref 1.2–2.2)
Albumin: 3.5 g/dL — ABNORMAL LOW (ref 3.8–4.8)
Alkaline Phosphatase: 115 IU/L (ref 44–121)
BUN/Creatinine Ratio: 12 (ref 10–24)
BUN: 12 mg/dL (ref 8–27)
Bilirubin Total: 0.4 mg/dL (ref 0.0–1.2)
CO2: 20 mmol/L (ref 20–29)
Calcium: 9.1 mg/dL (ref 8.6–10.2)
Chloride: 102 mmol/L (ref 96–106)
Creatinine, Ser: 1.04 mg/dL (ref 0.76–1.27)
GFR calc Af Amer: 84 mL/min/{1.73_m2} (ref >59)
GFR calc non Af Amer: 72 mL/min/{1.73_m2} (ref >59)
Globulin, Total: 3.5 g/dL (ref 1.5–4.5)
Glucose: 72 mg/dL (ref 65–99)
Potassium: 3.9 mmol/L (ref 3.5–5.2)
Sodium: 136 mmol/L (ref 134–144)
Total Protein: 7 g/dL (ref 6.0–8.5)

## 2020-01-08 LAB — CBC
Hematocrit: 44.1 % (ref 37.5–51.0)
Hemoglobin: 14.5 g/dL (ref 13.0–17.7)
MCH: 28.3 pg (ref 26.6–33.0)
MCHC: 32.9 g/dL (ref 31.5–35.7)
MCV: 86 fL (ref 79–97)
Platelets: 144 10*3/uL — ABNORMAL LOW (ref 150–450)
RBC: 5.13 x10E6/uL (ref 4.14–5.80)
RDW: 14.2 % (ref 11.6–15.4)
WBC: 11.3 10*3/uL — ABNORMAL HIGH (ref 3.4–10.8)

## 2020-01-08 LAB — LIPID PANEL
Chol/HDL Ratio: 3.1 ratio (ref 0.0–5.0)
Cholesterol, Total: 116 mg/dL (ref 100–199)
HDL: 38 mg/dL — ABNORMAL LOW (ref >39)
LDL Chol Calc (NIH): 62 mg/dL (ref 0–99)
Triglycerides: 79 mg/dL (ref 0–149)
VLDL Cholesterol Cal: 16 mg/dL (ref 5–40)

## 2020-01-14 ENCOUNTER — Telehealth: Payer: Self-pay

## 2020-01-14 DIAGNOSIS — E0821 Diabetes mellitus due to underlying condition with diabetic nephropathy: Secondary | ICD-10-CM

## 2020-01-14 NOTE — Telephone Encounter (Signed)
lmtcb okay for [facility] triage nurse to advise of lab results. KW

## 2020-01-14 NOTE — Telephone Encounter (Signed)
-----   Message from [name], MD sent at 01/09/2020  8:07 AM EDT ----- Labs are all good. Continue current medications.  Just try to be more strict with diet and avoid salty food. Schedule diabetes follow up in 6 months please.

## 2020-01-14 NOTE — Telephone Encounter (Signed)
He can one months supply Jardiance 25mg  daily and Janumet 50-1000 two times daily if we have them.

## 2020-01-14 NOTE — Telephone Encounter (Signed)
Pt is requesting samples of Jardance and [place].

## 2020-01-14 NOTE — Telephone Encounter (Signed)
Copied from [place] [phone]. Topic: General - Other >> Jan 14, 2020  9:52 AM Rainey Pines A wrote: Patient wants to know if he can have any samples of janumet and jardiance today. Please advise

## 2020-01-14 NOTE — Telephone Encounter (Signed)
Pt given lab results per notes of Dr Caryn Section  on 01/09/20. Pt verbalized understanding. Appt made for f/u in March 2022.

## 2020-01-16 MED ORDER — EMPAGLIFLOZIN 25 MG PO TABS: ORAL_TABLET | ORAL | 0 refills | Status: DC

## 2020-01-16 NOTE — Addendum Note (Signed)
Addended by: Randal Buba on: 01/16/2020 02:39 PM   Modules accepted: Orders

## 2020-01-16 NOTE — Telephone Encounter (Signed)
We are out of Janumet 50-1000mg  samples. Patient advised. Samples of Jardiance left up front for pick up.

## 2020-01-29 LAB — HM DIABETES EYE EXAM

## 2020-02-04 ENCOUNTER — Telehealth: Payer: Self-pay

## 2020-02-04 DIAGNOSIS — E0821 Diabetes mellitus due to underlying condition with diabetic nephropathy: Secondary | ICD-10-CM

## 2020-02-04 NOTE — Telephone Encounter (Signed)
Okay for patient to have Jardiance samples. No Janumet samples available.

## 2020-02-04 NOTE — Telephone Encounter (Signed)
Copied from [place] [id number]. Topic: Quick Communication - Rx Refill/Question >> Feb 01, 2020  3:19 PM Erick Blinks wrote: Pt called requesting call back [phone] Samples of jardiance & janumet 50/1000

## 2020-02-06 MED ORDER — EMPAGLIFLOZIN 25 MG PO TABS: ORAL_TABLET | ORAL | 0 refills | Status: DC

## 2020-02-06 NOTE — Telephone Encounter (Signed)
Can have 4 bottles of Jardiance 25mg  samples if we have them.

## 2020-02-06 NOTE — Telephone Encounter (Signed)
Samples left up front for pick up. Patient advised.

## 2020-02-21 ENCOUNTER — Other Ambulatory Visit: Payer: Self-pay | Admitting: Family Medicine

## 2020-02-21 DIAGNOSIS — I10 Essential (primary) hypertension: Secondary | ICD-10-CM

## 2020-03-05 ENCOUNTER — Telehealth: Payer: Self-pay

## 2020-03-05 NOTE — Telephone Encounter (Signed)
Copied from [place] [phone]. Topic: General - Other >> Mar 05, 2020  4:10 PM Keene Breath wrote: Reason for CRM: Patient called to ask if the doctor had any samples of empagliflozin (JARDIANCE) 25 MG TABS tablet and sitaGLIPtin-metformin (JANUMET) 50-1000 MG tablet.  Please call patient to let him know at [phone]

## 2020-03-07 NOTE — Telephone Encounter (Signed)
He can have one months worth if we have them. Thanks.

## 2020-03-07 NOTE — Telephone Encounter (Signed)
Please advise if ok to give samples if we have some available.

## 2020-03-07 NOTE — Telephone Encounter (Signed)
Advised patient as below.  

## 2020-03-20 ENCOUNTER — Other Ambulatory Visit: Payer: Self-pay

## 2020-03-20 NOTE — Progress Notes (Signed)
This encounter was created in error - please disregard.

## 2020-03-25 NOTE — Progress Notes (Signed)
This encounter was created in error - please disregard.

## 2020-03-26 ENCOUNTER — Other Ambulatory Visit: Payer: Self-pay

## 2020-04-08 ENCOUNTER — Telehealth: Payer: Self-pay

## 2020-04-08 NOTE — Telephone Encounter (Signed)
Jardiance #4 boxes given to patient. No Janumet available.

## 2020-04-08 NOTE — Telephone Encounter (Signed)
Patient is asking for samples of Jardiance 25 mg. And Janumet 50-1000. Please call if we have any.

## 2020-04-08 NOTE — Progress Notes (Signed)
This encounter was created in error - please disregard.

## 2020-04-09 ENCOUNTER — Other Ambulatory Visit: Payer: Self-pay

## 2020-04-09 ENCOUNTER — Telehealth: Payer: Self-pay

## 2020-04-09 NOTE — Telephone Encounter (Signed)
Called pt to complete his telephonic AWV and he was unable to complete the visit due to having to work. This is the second time AWV has been cx. Pt states he never know when he has a pick up and he works [employer]. Pt declined rescheduling the AWV due to his work schedule. Please remove from list. Thank you!

## 2020-04-14 DIAGNOSIS — M1710 Unilateral primary osteoarthritis, unspecified knee: Secondary | ICD-10-CM | POA: Insufficient documentation

## 2020-04-14 DIAGNOSIS — M179 Osteoarthritis of knee, unspecified: Secondary | ICD-10-CM | POA: Insufficient documentation

## 2020-05-12 ENCOUNTER — Telehealth: Payer: Self-pay

## 2020-05-12 NOTE — Telephone Encounter (Signed)
He can have one months supply if we have them.

## 2020-05-12 NOTE — Telephone Encounter (Signed)
Please advise if ol to give samples if available.

## 2020-05-12 NOTE — Telephone Encounter (Signed)
Patient stopped by the office asking for samples of Janumet  50-1000 and Jardiance 25 mg. .  Call if we have them please

## 2020-05-13 NOTE — Telephone Encounter (Signed)
Advised pt. Samples up front.

## 2020-07-03 ENCOUNTER — Other Ambulatory Visit: Payer: Self-pay | Admitting: Family Medicine

## 2020-07-03 DIAGNOSIS — E0821 Diabetes mellitus due to underlying condition with diabetic nephropathy: Secondary | ICD-10-CM

## 2020-07-03 DIAGNOSIS — I2510 Atherosclerotic heart disease of native coronary artery without angina pectoris: Secondary | ICD-10-CM | POA: Diagnosis not present

## 2020-07-03 DIAGNOSIS — I10 Essential (primary) hypertension: Secondary | ICD-10-CM | POA: Diagnosis not present

## 2020-07-03 DIAGNOSIS — E1121 Type 2 diabetes mellitus with diabetic nephropathy: Secondary | ICD-10-CM | POA: Diagnosis not present

## 2020-07-03 DIAGNOSIS — E782 Mixed hyperlipidemia: Secondary | ICD-10-CM | POA: Diagnosis not present

## 2020-07-03 DIAGNOSIS — Z6838 Body mass index (BMI) 38.0-38.9, adult: Secondary | ICD-10-CM | POA: Diagnosis not present

## 2020-07-03 NOTE — Telephone Encounter (Signed)
Requested Prescriptions  Pending Prescriptions Disp Refills  . JANUMET 50-1000 MG tablet [Pharmacy Med Name: JANUMET 50-1,000 MG TABLET] 180 tablet 0    Sig: TAKE 1 TABLET BY MOUTH TWICE A DAY     Endocrinology:  Diabetes - Biguanide + DPP-4 Inhibitor Combos Failed - 07/03/2020  1:45 AM      Failed - Valid encounter within last 6 months    Recent Outpatient Visits          6 months ago History of cerebrovascular accident (CVA) with residual deficit   [facility] [name], MD   9 months ago Bursitis of left shoulder   [facility] [name], MD   1 year ago Diabetes mellitus due to underlying condition, controlled, with diabetic nephropathy, without long-term current use of insulin ([place])   [facility] [name], MD   1 year ago Diabetes mellitus due to underlying condition, controlled, with diabetic nephropathy, without long-term current use of insulin ([place])   [facility] [name], MD   1 year ago Irregular heartbeat   [facility] [name], MD      Future Appointments            In 1 week Fisher, Kirstie Peri, MD [facility]           Passed - HBA1C is between 0 and 7.9 and within 180 days    Hgb A1c MFr Bld  Date Value Ref Range Status  01/07/2020 6.6 (H) 4.8 - 5.6 % Final    Comment:             Prediabetes: 5.7 - 6.4          Diabetes: >6.4          Glycemic control for adults with diabetes: <7.0          Passed - Cr in normal range and within 360 days    Creatinine, Ser  Date Value Ref Range Status  01/07/2020 1.04 0.76 - 1.27 mg/dL Final   Creatinine, POC  Date Value Ref Range Status  12/03/2016 na mg/dL Final         Passed - AA eGFR in normal range and within 360 days    GFR calc Af Amer  Date Value Ref Range Status  01/07/2020 84 >59 mL/min/1.73 Final    Comment:    **[facility] currently reports eGFR in compliance with the  current**   recommendations of the [employer]. [facility] will   update reporting as new guidelines are published from the NKF-ASN   Task force.    GFR calc non Af Amer  Date Value Ref Range Status  01/07/2020 72 >59 mL/min/1.73 Final         . JARDIANCE 25 MG TABS tablet [Pharmacy Med Name: JARDIANCE 25 MG TABLET] 90 tablet 0    Sig: TAKE 1 TABLET BY MOUTH EVERY DAY     Endocrinology:  Diabetes - SGLT2 Inhibitors Failed - 07/03/2020  1:45 AM      Failed - LDL in normal range and within 360 days    LDL Chol Calc (NIH)  Date Value Ref Range Status  01/07/2020 62 0 - 99 mg/dL Final         Failed - Valid encounter within last 6 months    Recent Outpatient Visits          6 months ago History of cerebrovascular accident (CVA) with residual  deficit   [facility] [name], MD   9 months ago Bursitis of left shoulder   [facility] [name], MD   1 year ago Diabetes mellitus due to underlying condition, controlled, with diabetic nephropathy, without long-term current use of insulin ([place])   [facility] [name], MD   1 year ago Diabetes mellitus due to underlying condition, controlled, with diabetic nephropathy, without long-term current use of insulin ([place])   [facility] [name], MD   1 year ago Irregular heartbeat   [facility] [name], MD      Future Appointments            In 1 week Fisher, Kirstie Peri, MD [facility]           Passed - Cr in normal range and within 360 days    Creatinine, Ser  Date Value Ref Range Status  01/07/2020 1.04 0.76 - 1.27 mg/dL Final   Creatinine, POC  Date Value Ref Range Status  12/03/2016 na mg/dL Final         Passed - HBA1C is between 0 and 7.9 and within 180 days    Hgb A1c MFr Bld  Date Value Ref Range Status  01/07/2020 6.6 (H) 4.8 - 5.6 % Final    Comment:             Prediabetes:  5.7 - 6.4          Diabetes: >6.4          Glycemic control for adults with diabetes: <7.0          Passed - AA eGFR in normal range and within 360 days    GFR calc Af Amer  Date Value Ref Range Status  01/07/2020 84 >59 mL/min/1.73 Final    Comment:    **[facility] currently reports eGFR in compliance with the current**   recommendations of the [employer]. [facility] will   update reporting as new guidelines are published from the NKF-ASN   Task force.    GFR calc non Af Amer  Date Value Ref Range Status  01/07/2020 72 >59 mL/min/1.73 Final

## 2020-07-09 ENCOUNTER — Other Ambulatory Visit: Payer: Self-pay

## 2020-07-09 DIAGNOSIS — E0821 Diabetes mellitus due to underlying condition with diabetic nephropathy: Secondary | ICD-10-CM

## 2020-07-09 MED ORDER — JANUMET 50-1000 MG PO TABS: 1.0000 | ORAL_TABLET | Freq: Two times a day (BID) | ORAL | 0 refills | Status: DC

## 2020-07-09 NOTE — Telephone Encounter (Signed)
Patient came by the office requesting samples of Janumet 50-1000mg . Patient states his pharmacy is out of stock. Two weeks worth of samples given to patient. Patient has a follow up appointment with Dr. Caryn Section scheduled for Monday 07/14/2020.

## 2020-07-14 ENCOUNTER — Other Ambulatory Visit: Payer: Self-pay

## 2020-07-14 ENCOUNTER — Ambulatory Visit (INDEPENDENT_AMBULATORY_CARE_PROVIDER_SITE_OTHER): Payer: Medicare HMO | Admitting: Family Medicine

## 2020-07-14 VITALS — BP 143/80 | HR 69 | Temp 98.1°F | Resp 16 | Wt 243.0 lb

## 2020-07-14 DIAGNOSIS — E1140 Type 2 diabetes mellitus with diabetic neuropathy, unspecified: Secondary | ICD-10-CM

## 2020-07-14 DIAGNOSIS — E0821 Diabetes mellitus due to underlying condition with diabetic nephropathy: Secondary | ICD-10-CM

## 2020-07-14 LAB — POCT GLYCOSYLATED HEMOGLOBIN (HGB A1C)
Est. average glucose Bld gHb Est-mCnc: 134
Hemoglobin A1C: 6.3 % — AB (ref 4.0–5.6)

## 2020-07-14 MED ORDER — SITAGLIP PHOS-METFORMIN HCL ER 100-1000 MG PO TB24: 1.0000 | ORAL_TABLET | Freq: Every day | ORAL | 5 refills | Status: DC

## 2020-07-14 NOTE — Progress Notes (Signed)
Established patient visit   Patient: Todd Potter   DOB: 29-Jun-1950   70 y.o. Male  MRN: [id number] Visit Date: 07/14/2020  Today's healthcare provider: [name], MD   Chief Complaint  Patient presents with  . Hypertension   Subjective    HPI  Diabetes Mellitus Type II, follow-up  Lab Results  Component Value Date   HGBA1C 6.6 (H) 01/07/2020   HGBA1C 6.1 (A) 06/29/2019   HGBA1C 6.1 (A) 03/02/2019   Last seen for diabetes 6 months ago.  Management since then includes continuing the same treatment. He reports excellent compliance with treatment. He is not having side effects.   Home blood sugar records: not being checked  Episodes of hypoglycemia? No    Current insulin regiment: none Most Recent Eye Exam: [facility]  --------------------------------------------------------------------------------------------------- Hypertension, follow-up  BP Readings from Last 3 Encounters:  01/04/20 (!) 149/80  09/11/19 138/80  06/29/19 118/72   Wt Readings from Last 3 Encounters:  01/04/20 236 lb (107 kg)  09/11/19 236 lb (107 kg)  06/29/19 235 lb 3.2 oz (106.7 kg)     He was last seen for hypertension 6 months ago.  BP at that visit was 149/80. Management since that visit includes continue current medication. He reports excellent compliance with treatment. He is not having side effects.  He is not exercising. He is not adherent to low salt diet.   Outside blood pressures are not being checked.  He does not smoke.  Use of agents associated with hypertension: none.   --------------------------------------------------------------------------------------------------- Lipid/Cholesterol, follow-up  Last Lipid Panel: Lab Results  Component Value Date   CHOL 116 01/07/2020   LDLCALC 62 01/07/2020   HDL 38 (L) 01/07/2020   TRIG 79 01/07/2020    He was last seen for this 6 months ago.  Management since that visit includes continue current  medication.  He reports excellent compliance with treatment. He is not having side effects.   Symptoms: No appetite changes No foot ulcerations  No chest pain No chest pressure/discomfort  No dyspnea No orthopnea  No fatigue No lower extremity edema  No palpitations No paroxysmal nocturnal dyspnea  No nausea Yes numbness or tingling of extremity  No polydipsia No polyuria  No speech difficulty No syncope   He is following a Regular diet. Current exercise: none  No Known Allergies     Medications: Outpatient Medications Prior to Visit  Medication Sig  . amLODipine (NORVASC) 10 MG tablet Take 1 tablet (10 mg total) by mouth daily.  Marland Kitchen aspirin 325 MG EC tablet Take 1 tablet (325 mg total) by mouth daily.  Marland Kitchen atorvastatin (LIPITOR) 80 MG tablet TAKE 1 TABLET DAILY  . glipiZIDE (GLUCOTROL XL) 5 MG 24 hr tablet Take 1 tablet (5 mg total) by mouth daily with breakfast.  . JARDIANCE 25 MG TABS tablet TAKE 1 TABLET BY MOUTH EVERY DAY  . losartan (COZAAR) 100 MG tablet TAKE 1 TABLET BY MOUTH EVERY DAY  . meloxicam (MOBIC) 15 MG tablet TAKE 1 TABLET BY MOUTH EVERY DAY  . metoprolol (TOPROL-XL) 200 MG 24 hr tablet Take 1 tablet (200 mg total) by mouth daily.  . nitroGLYCERIN (NITROSTAT) 0.4 MG SL tablet Place 1 tablet (0.4 mg total) under the tongue every 5 (five) minutes as needed for chest pain. 3-5 minutes x3 as nedded  . sitaGLIPtin-metformin (JANUMET) 50-1000 MG tablet Take 1 tablet by mouth 2 (two) times daily.  . tadalafil (CIALIS) 5 MG tablet Take 1 tablet (  5 mg total) by mouth every other day as needed for erectile dysfunction.   No facility-administered medications prior to visit.    Review of Systems  Constitutional: Negative for activity change, appetite change, chills and fatigue.  Eyes: Negative for visual disturbance.  Respiratory: Negative for chest tightness and shortness of breath.   Cardiovascular: Negative for chest pain, palpitations and leg swelling.   Gastrointestinal: Negative for abdominal pain.  Endocrine: Negative for polyphagia and polyuria.       Objective    BP (!) 143/80   Pulse 69   Temp 98.1 F (36.7 C)   Resp 16   Wt 243 lb (110.2 kg)   SpO2 98%   BMI 38.06 kg/m     Physical Exam    General: Appearance:    Obese male in no acute distress  Eyes:    PERRL, conjunctiva/corneas clear, EOM's intact       Lungs:     Clear to auscultation bilaterally, respirations unlabored  Heart:    Normal heart rate. Normal rhythm. No murmurs, rubs, or gallops.   MS:   All extremities are intact.   Neurologic:   Awake, alert, oriented x 3. No apparent focal neurological           defect.        Results for orders placed or performed in visit on 07/14/20  POCT glycosylated hemoglobin (Hb A1C)  Result Value Ref Range   Hemoglobin A1C 6.3 (A) 4.0 - 5.6 %   Est. average glucose Bld gHb Est-mCnc 134     Assessment & Plan     1. Diabetes mellitus due to underlying condition, controlled, with diabetic nephropathy, without long-term current use of insulin ([place]) He states he has been having more frequent and loose bowel movements. Will change from [id number] BID to - SitaGLIPtin-MetFORMIN HCl [phone] MG TB24; Take 1 tablet by mouth daily.  Dispense: 30 tablet; Refill: 5   Future Appointments  Date Time Provider [place]  10/03/2020  8:20 AM [name], MD [facility]        The entirety of the information documented in the History of Present Illness, Review of Systems and Physical Exam were personally obtained by me. Portions of this information were initially documented by the CMA and reviewed by me for thoroughness and accuracy.      [name], MD  [facility] [phone] (phone) [phone] (fax)  [place]

## 2020-07-15 ENCOUNTER — Encounter: Payer: Self-pay | Admitting: Family Medicine

## 2020-07-18 ENCOUNTER — Other Ambulatory Visit: Payer: Self-pay | Admitting: Family Medicine

## 2020-07-18 DIAGNOSIS — I10 Essential (primary) hypertension: Secondary | ICD-10-CM

## 2020-07-18 DIAGNOSIS — E7800 Pure hypercholesterolemia, unspecified: Secondary | ICD-10-CM

## 2020-07-18 DIAGNOSIS — E0821 Diabetes mellitus due to underlying condition with diabetic nephropathy: Secondary | ICD-10-CM

## 2020-07-18 MED ORDER — METOPROLOL SUCCINATE ER 200 MG PO TB24: 200.0000 mg | ORAL_TABLET | Freq: Every day | ORAL | 1 refills | Status: DC

## 2020-07-18 MED ORDER — LOSARTAN POTASSIUM 100 MG PO TABS: 100.0000 mg | ORAL_TABLET | Freq: Every day | ORAL | 1 refills | Status: DC

## 2020-07-18 MED ORDER — ATORVASTATIN CALCIUM 80 MG PO TABS: 80.0000 mg | ORAL_TABLET | Freq: Every day | ORAL | 1 refills | Status: DC

## 2020-07-18 MED ORDER — GLIPIZIDE ER 5 MG PO TB24: 5.0000 mg | ORAL_TABLET | Freq: Every day | ORAL | 1 refills | Status: DC

## 2020-07-18 MED ORDER — AMLODIPINE BESYLATE 10 MG PO TABS: 10.0000 mg | ORAL_TABLET | Freq: Every day | ORAL | 1 refills | Status: DC

## 2020-07-18 NOTE — Telephone Encounter (Signed)
Medication: amLODipine (NORVASC) 10 MG tablet atorvastatin (LIPITOR) 80 MG tablet glipiZIDE (GLUCOTROL XL) 5 MG 24 hr tablet losartan (COZAAR) 100 MG tablet metoprolol (TOPROL-XL) 200 MG 24 hr tablet  Has the pt contacted their pharmacy? Yes  Pt states the pharmacy told him they have been trying to contact the dr- no response Pt is now out of his meds waiting for the refills Preferred pharmacy: [facility] [place]  Please be advised refills may take up to 3 business days.  We ask that you follow up with your pharmacy.

## 2020-08-04 ENCOUNTER — Other Ambulatory Visit: Payer: Self-pay

## 2020-08-04 ENCOUNTER — Ambulatory Visit (INDEPENDENT_AMBULATORY_CARE_PROVIDER_SITE_OTHER): Payer: Medicare HMO | Admitting: Podiatry

## 2020-08-04 ENCOUNTER — Encounter: Payer: Self-pay | Admitting: Podiatry

## 2020-08-04 DIAGNOSIS — M2012 Hallux valgus (acquired), left foot: Secondary | ICD-10-CM

## 2020-08-04 DIAGNOSIS — M21612 Bunion of left foot: Secondary | ICD-10-CM | POA: Diagnosis not present

## 2020-08-04 DIAGNOSIS — M2041 Other hammer toe(s) (acquired), right foot: Secondary | ICD-10-CM

## 2020-08-04 DIAGNOSIS — M2042 Other hammer toe(s) (acquired), left foot: Secondary | ICD-10-CM

## 2020-08-04 DIAGNOSIS — L84 Corns and callosities: Secondary | ICD-10-CM | POA: Diagnosis not present

## 2020-08-04 DIAGNOSIS — B351 Tinea unguium: Secondary | ICD-10-CM

## 2020-08-04 DIAGNOSIS — M21611 Bunion of right foot: Secondary | ICD-10-CM

## 2020-08-04 DIAGNOSIS — M79674 Pain in right toe(s): Secondary | ICD-10-CM | POA: Diagnosis not present

## 2020-08-04 DIAGNOSIS — E0821 Diabetes mellitus due to underlying condition with diabetic nephropathy: Secondary | ICD-10-CM

## 2020-08-04 DIAGNOSIS — M79675 Pain in left toe(s): Secondary | ICD-10-CM

## 2020-08-04 DIAGNOSIS — M2011 Hallux valgus (acquired), right foot: Secondary | ICD-10-CM | POA: Diagnosis not present

## 2020-08-04 NOTE — Progress Notes (Signed)
  Subjective:  Patient ID: Todd Potter, male    DOB: 10-03-1950,  MRN: [id number]  Chief Complaint  Patient presents with  . Nail Problem    Patient presents today for painful callous, possible ingrown toenail left 2nd toe.  He says it only hurts when he walks     70 y.o. male presents with the above complaint. History confirmed with patient.  He also complains of thickened elongated yellowed toenails that he has difficulty cutting.  Type 2 diabetes he reports good glycemic control  Objective:  Physical Exam: warm, good capillary refill, no trophic changes or ulcerative lesions, normal DP and PT pulses, normal monofilament exam and normal sensory exam.  Hallux valgus deformity and hammertoes.  Onychomycosis x10 with thickened elongated yellow nail plates.  Distal tip callus on second toe left Assessment:   1. Pain due to onychomycosis of toenails of both feet   2. Callus of foot   3. Hallux valgus with bunions, left   4. Hallux valgus with bunions, right   5. Hammertoe of left foot   6. Hammertoe of right foot   7. Diabetes mellitus due to underlying condition, controlled, with diabetic nephropathy, without long-term current use of insulin ([place])      Plan:  Patient was evaluated and treated and all questions answered.  Patient educated on diabetes. Discussed proper diabetic foot care and discussed risks and complications of disease. Educated patient in depth on reasons to return to the office immediately should he/she discover anything concerning or new on the feet. All questions answered. Discussed proper shoes as well.   Discussed the etiology and treatment options for the condition in detail with the patient. Educated patient on the topical and oral treatment options for mycotic nails. Recommended debridement of the nails today. Sharp and mechanical debridement performed of all painful and mycotic nails today. Nails debrided in length and thickness using a nail nipper to level of  comfort. Discussed treatment options including appropriate shoe gear. Follow up as needed for painful nails.   All symptomatic hyperkeratoses were safely debrided with a sterile #15 blade to patient's level of comfort without incident. We discussed preventative and palliative care of these lesions including supportive and accommodative shoegear, padding, prefabricated and custom molded accommodative orthoses, use of a pumice stone and lotions/creams daily.  Silicone offloading pads dispensed  Return if symptoms worsen or fail to improve.

## 2020-08-18 ENCOUNTER — Telehealth: Payer: Self-pay

## 2020-08-18 NOTE — Telephone Encounter (Signed)
Please advise if OK to give samples. We have 4 bottles of Janumet, and 6 bottles of Jardiance available.

## 2020-08-18 NOTE — Telephone Encounter (Signed)
Patient is asking for samples of Jardiance 25  Mg. And Janumet 100/1000 mg.  Please call and let him know if we have these.

## 2020-08-18 NOTE — Telephone Encounter (Signed)
Ok, can have one months supply of each.

## 2020-08-18 NOTE — Telephone Encounter (Signed)
Left detailed voicemail for patient to pick up samples.

## 2020-08-31 ENCOUNTER — Other Ambulatory Visit: Payer: Self-pay | Admitting: Family Medicine

## 2020-08-31 DIAGNOSIS — I10 Essential (primary) hypertension: Secondary | ICD-10-CM

## 2020-08-31 NOTE — Telephone Encounter (Signed)
Change of pharmacy Requested Prescriptions  Pending Prescriptions Disp Refills  . losartan (COZAAR) 100 MG tablet [Pharmacy Med Name: LOSARTAN POTASSIUM 100 MG TAB] 90 tablet 1    Sig: TAKE 1 TABLET BY MOUTH EVERY DAY     Cardiovascular:  Angiotensin Receptor Blockers Failed - 08/31/2020  9:04 AM      Failed - Cr in normal range and within 180 days    Creatinine, Ser  Date Value Ref Range Status  01/07/2020 1.04 0.76 - 1.27 mg/dL Final   Creatinine, POC  Date Value Ref Range Status  12/03/2016 na mg/dL Final         Failed - K in normal range and within 180 days    Potassium  Date Value Ref Range Status  01/07/2020 3.9 3.5 - 5.2 mmol/L Final         Failed - Last BP in normal range    BP Readings from Last 1 Encounters:  07/14/20 (!) 143/80         Passed - Patient is not pregnant      Passed - Valid encounter within last 6 months    Recent Outpatient Visits          1 month ago Diabetes mellitus due to underlying condition, controlled, with diabetic nephropathy, without long-term current use of insulin ([place])   [facility] [name], MD   8 months ago History of cerebrovascular accident (CVA) with residual deficit   [facility], Kirstie Peri, MD   11 months ago Bursitis of left shoulder   [facility] [name], MD   1 year ago Diabetes mellitus due to underlying condition, controlled, with diabetic nephropathy, without long-term current use of insulin ([place])   [facility] [name], MD   1 year ago Diabetes mellitus due to underlying condition, controlled, with diabetic nephropathy, without long-term current use of insulin [facility])   [place], Kirstie Peri, MD      Future Appointments            In 1 month Fisher, Kirstie Peri, MD [facility], [place]

## 2020-09-19 ENCOUNTER — Telehealth: Payer: Self-pay

## 2020-09-19 NOTE — Telephone Encounter (Signed)
I called and spoke with patient. He states he left some paperwork for Dr. Caryn Section to fill our regarding his A1C results. Patient states he needs these paper completed and faxed ASAP for his [place] license. I located the forms and had Helene Kelp in medical records fax the paperwork. Patient advised.

## 2020-09-19 NOTE — Telephone Encounter (Signed)
Copied from [place] [id number]. Topic: General - Other >> Sep 19, 2020 11:04 AM Yvette Rack wrote: Reason for CRM: Pt requests call back from Dr. Maralyn Sago nurse regarding some paperwork he left for completion. Pt requests call back.

## 2020-09-23 ENCOUNTER — Telehealth: Payer: Self-pay

## 2020-09-23 NOTE — Telephone Encounter (Signed)
Patient is asking for samples of Jardiance 25 mg. Please let him know

## 2020-09-23 NOTE — Telephone Encounter (Signed)
Two week sample left at the front desk.  Pt advised.   Thanks,   -Mickel Baas

## 2020-09-23 NOTE — Telephone Encounter (Signed)
He can have 2 weeks samples if we have them.

## 2020-10-03 ENCOUNTER — Other Ambulatory Visit: Payer: Self-pay

## 2020-10-03 ENCOUNTER — Ambulatory Visit (INDEPENDENT_AMBULATORY_CARE_PROVIDER_SITE_OTHER): Payer: Medicare HMO | Admitting: Family Medicine

## 2020-10-03 ENCOUNTER — Encounter: Payer: Self-pay | Admitting: Family Medicine

## 2020-10-03 VITALS — BP 121/67 | HR 65 | Wt 245.0 lb

## 2020-10-03 DIAGNOSIS — E1129 Type 2 diabetes mellitus with other diabetic kidney complication: Secondary | ICD-10-CM | POA: Diagnosis not present

## 2020-10-03 DIAGNOSIS — I10 Essential (primary) hypertension: Secondary | ICD-10-CM | POA: Diagnosis not present

## 2020-10-03 DIAGNOSIS — E7800 Pure hypercholesterolemia, unspecified: Secondary | ICD-10-CM | POA: Diagnosis not present

## 2020-10-03 DIAGNOSIS — E0821 Diabetes mellitus due to underlying condition with diabetic nephropathy: Secondary | ICD-10-CM

## 2020-10-03 LAB — POCT GLYCOSYLATED HEMOGLOBIN (HGB A1C): Hemoglobin A1C: 7 % — AB (ref 4.0–5.6)

## 2020-10-03 MED ORDER — EMPAGLIFLOZIN 25 MG PO TABS: 25.0000 mg | ORAL_TABLET | Freq: Every day | ORAL | 4 refills | Status: DC

## 2020-10-03 NOTE — Progress Notes (Signed)
Established patient visit   Patient: Todd Potter   DOB: 10-Apr-1951   70 y.o. Male  MRN: [id number] Visit Date: 10/03/2020  Today's healthcare provider: [name], MD   Chief Complaint  Patient presents with   Hypertension   Hyperlipidemia   Diabetes   Subjective    HPI  Diabetes Mellitus Type II, follow-up  Lab Results  Component Value Date   HGBA1C 6.3 (A) 07/14/2020   HGBA1C 6.6 (H) 01/07/2020   HGBA1C 6.1 (A) 06/29/2019   Last seen for diabetes 3 months ago.  Management since then includes changing his sitagliptin-metformin to long acting. He reports excellent compliance with treatment. He is not having side effects.   Home blood sugar records:  are not being checekd at home.  Episodes of hypoglycemia? No    Current insulin regiment: none Most Recent Eye Exam: 01/29/2020  --------------------------------------------------------------------------------------------------- Hypertension, follow-up  BP Readings from Last 3 Encounters:  10/03/20 121/67  07/14/20 (!) 143/80  01/04/20 (!) 149/80   Wt Readings from Last 3 Encounters:  10/03/20 245 lb (111.1 kg)  07/14/20 243 lb (110.2 kg)  01/04/20 236 lb (107 kg)     He was last seen for hypertension 3 months ago.  BP at that visit was 143/80. Management since that visit includes no changes. He reports excellent compliance with treatment. He is not having side effects.  He is not exercising. He is adherent to low salt diet.   Outside blood pressures are not being checked.  He does not smoke.  Use of agents associated with hypertension: none.   --------------------------------------------------------------------------------------------------- Lipid/Cholesterol, follow-up  Last Lipid Panel: Lab Results  Component Value Date   CHOL 116 01/07/2020   LDLCALC 62 01/07/2020   HDL 38 (L) 01/07/2020   TRIG 79 01/07/2020    He was last seen for this 3 months ago.  Management since that visit  includes no changes.  He reports excellent compliance with treatment. He is not having side effects.   Symptoms: No appetite changes No foot ulcerations  No chest pain No chest pressure/discomfort  No dyspnea No orthopnea  No fatigue Yes lower extremity edema  No palpitations No paroxysmal nocturnal dyspnea  No nausea No numbness or tingling of extremity  No polydipsia No polyuria  No speech difficulty No syncope    Last metabolic panel Lab Results  Component Value Date   GLUCOSE 72 01/07/2020   NA 136 01/07/2020   K 3.9 01/07/2020   BUN 12 01/07/2020   CREATININE 1.04 01/07/2020   GFRNONAA 72 01/07/2020   GFRAA 84 01/07/2020   CALCIUM 9.1 01/07/2020   AST 16 01/07/2020   ALT 21 01/07/2020   The ASCVD Risk score Mikey Bussing DC Jr., et al., 2013) failed to calculate for the following reasons:   The patient has a prior MI or stroke diagnosis  ---------------------------------------------------------------------------------------------------      Medications: Outpatient Medications Prior to Visit  Medication Sig   amLODipine (NORVASC) 10 MG tablet Take 1 tablet (10 mg total) by mouth daily.   aspirin 325 MG EC tablet Take 1 tablet (325 mg total) by mouth daily.   atorvastatin (LIPITOR) 80 MG tablet Take 1 tablet (80 mg total) by mouth daily.   glipiZIDE (GLUCOTROL XL) 5 MG 24 hr tablet Take 1 tablet (5 mg total) by mouth daily with breakfast.   JARDIANCE 25 MG TABS tablet TAKE 1 TABLET BY MOUTH EVERY DAY   losartan (COZAAR) 100 MG tablet TAKE 1 TABLET BY  MOUTH EVERY DAY   metoprolol (TOPROL-XL) 200 MG 24 hr tablet Take 1 tablet (200 mg total) by mouth daily.   nitroGLYCERIN (NITROSTAT) 0.4 MG SL tablet Place 1 tablet (0.4 mg total) under the tongue every 5 (five) minutes as needed for chest pain. 3-5 minutes x3 as nedded   SitaGLIPtin-MetFORMIN HCl [id number] MG TB24 Take 1 tablet by mouth daily.   No facility-administered medications prior to visit.    Review of Systems   Constitutional: Negative.   Respiratory: Negative.    Cardiovascular:  Positive for leg swelling. Negative for chest pain and palpitations.  Gastrointestinal: Negative.   Endocrine: Negative.   Skin:  Negative for wound.  Neurological:  Negative for dizziness, syncope, speech difficulty, light-headedness, numbness and headaches.      Objective    BP 121/67 (BP Location: Right Arm, Patient Position: Sitting, Cuff Size: Large)   Pulse 65   Wt 245 lb (111.1 kg)   SpO2 98%   BMI 38.37 kg/m     Physical Exam  General appearance: Mildly obese male, cooperative and in no acute distress Head: Normocephalic, without obvious abnormality, atraumatic Respiratory: Respirations even and unlabored, normal respiratory rate Extremities: All extremities are intact.  Skin: Skin color, texture, turgor normal. No rashes seen  Psych: Appropriate mood and affect. Neurologic: Mental status: Alert, oriented to person, place, and time, thought content appropriate.   Results for orders placed or performed in visit on 10/03/20  POCT glycosylated hemoglobin (Hb A1C)  Result Value Ref Range   Hemoglobin A1C 7.0 (A) 4.0 - 5.6 %    Assessment & Plan     1. Diabetes mellitus due to underlying condition, controlled, with diabetic nephropathy, without long-term current use of insulin (HCC)/Obesity associated with diabetes Fairly well controlled, but a1c up since last visit which he attributes to eating more white rice and potatoes. He is going to work on diet, refill- empagliflozin (JARDIANCE) 25 MG TABS tablet; Take 1 tablet (25 mg total) by mouth daily.  Dispense: 90 tablet; Refill: 4  Given six weeks samples Janumet 50-500 to take two a day.   Expect some weight reduction with dietary improvements.   2. Essential hypertension Well controlled.  Continue current medications.    3. Pure hypercholesterolemia He is tolerating atorvastatin well with no adverse effects.     Future Appointments  Date  Time Provider [place]  02/06/2021  8:40 AM [name], MD [facility]         The entirety of the information documented in the History of Present Illness, Review of Systems and Physical Exam were personally obtained by me. Portions of this information were initially documented by the CMA and reviewed by me for thoroughness and accuracy.     [name], MD  [facility] [phone] (phone) [phone] (fax)  [place]

## 2020-10-03 NOTE — Patient Instructions (Signed)
.   Please review the attached list of medications and notify my office if there are any errors.   . Please bring all of your medications to every appointment so we can make sure that our medication list is the same as yours.   

## 2020-10-15 ENCOUNTER — Telehealth: Payer: Self-pay | Admitting: Family Medicine

## 2020-10-15 DIAGNOSIS — E0821 Diabetes mellitus due to underlying condition with diabetic nephropathy: Secondary | ICD-10-CM

## 2020-10-15 NOTE — Telephone Encounter (Signed)
Ok to give if we have some per his med list. Also refer to ccm for med affordability

## 2020-10-15 NOTE — Telephone Encounter (Signed)
Patient is requesting samples of jardiance   Please advice

## 2020-10-16 MED ORDER — EMPAGLIFLOZIN 25 MG PO TABS: 25.0000 mg | ORAL_TABLET | Freq: Every day | ORAL | 0 refills | Status: DC

## 2020-10-16 NOTE — Telephone Encounter (Signed)
We had 2 samples left. Patient was advised.

## 2020-11-05 ENCOUNTER — Telehealth: Payer: Self-pay

## 2020-11-05 NOTE — Telephone Encounter (Signed)
Is it okay to give samples?  Thanks,   -Mickel Baas

## 2020-11-05 NOTE — Telephone Encounter (Signed)
Patient came by the office asking for samples of Jardiance 25 mg. Please call him and let him know if we have these.

## 2020-11-05 NOTE — Telephone Encounter (Signed)
We don't have samples at this time.  Pt advised.   Thanks,   -Mickel Baas

## 2020-11-05 NOTE — Telephone Encounter (Signed)
He can have a 1 month supply if we have them

## 2020-12-01 ENCOUNTER — Telehealth: Payer: Self-pay

## 2020-12-01 NOTE — Telephone Encounter (Signed)
Patient stopped by office asking for samples of Jardiance 25 mg.

## 2020-12-01 NOTE — Telephone Encounter (Signed)
Message read to patient. Verbalizes understanding.

## 2020-12-01 NOTE — Telephone Encounter (Signed)
He can have one months samples if we have them.

## 2020-12-01 NOTE — Telephone Encounter (Signed)
Ok to give samples

## 2021-01-07 ENCOUNTER — Telehealth: Payer: Self-pay | Admitting: Family Medicine

## 2021-01-07 NOTE — Telephone Encounter (Signed)
I placed 2 bottles of Januvia in a sample bag. We only had 2 bottles of Janumet 50-500mg . Patient advised on instructions of how to take Janumet 50-500mg . Samples placed up front for pick up.

## 2021-01-07 NOTE — Telephone Encounter (Signed)
He can have 2 bottle of Jardiance and a months worth of Janumet 50-500, he will need to take janumet twice a day.

## 2021-01-07 NOTE — Telephone Encounter (Signed)
Patient stopped by office asking for samples of   JANUMET and JARDIANCE

## 2021-01-07 NOTE — Telephone Encounter (Signed)
Please advise of ok to give samples. We have 3 bottles of Jardiance 25mg . We are out of Janumet 100-1000mg , but we do have several bottles of Janumet 50-500mg .

## 2021-01-10 DIAGNOSIS — I2510 Atherosclerotic heart disease of native coronary artery without angina pectoris: Secondary | ICD-10-CM | POA: Diagnosis not present

## 2021-01-10 DIAGNOSIS — E785 Hyperlipidemia, unspecified: Secondary | ICD-10-CM | POA: Diagnosis not present

## 2021-01-10 DIAGNOSIS — I252 Old myocardial infarction: Secondary | ICD-10-CM | POA: Diagnosis not present

## 2021-01-10 DIAGNOSIS — I10 Essential (primary) hypertension: Secondary | ICD-10-CM | POA: Diagnosis not present

## 2021-01-10 DIAGNOSIS — Z7982 Long term (current) use of aspirin: Secondary | ICD-10-CM | POA: Diagnosis not present

## 2021-01-10 DIAGNOSIS — E119 Type 2 diabetes mellitus without complications: Secondary | ICD-10-CM | POA: Diagnosis not present

## 2021-01-10 DIAGNOSIS — N529 Male erectile dysfunction, unspecified: Secondary | ICD-10-CM | POA: Diagnosis not present

## 2021-01-10 DIAGNOSIS — M1990 Unspecified osteoarthritis, unspecified site: Secondary | ICD-10-CM | POA: Diagnosis not present

## 2021-01-10 DIAGNOSIS — I69354 Hemiplegia and hemiparesis following cerebral infarction affecting left non-dominant side: Secondary | ICD-10-CM | POA: Diagnosis not present

## 2021-01-10 DIAGNOSIS — Z6841 Body Mass Index (BMI) 40.0 and over, adult: Secondary | ICD-10-CM | POA: Diagnosis not present

## 2021-01-10 DIAGNOSIS — Z7984 Long term (current) use of oral hypoglycemic drugs: Secondary | ICD-10-CM | POA: Diagnosis not present

## 2021-01-20 ENCOUNTER — Telehealth: Payer: Self-pay | Admitting: Family Medicine

## 2021-01-20 DIAGNOSIS — I10 Essential (primary) hypertension: Secondary | ICD-10-CM

## 2021-01-20 MED ORDER — METOPROLOL SUCCINATE ER 200 MG PO TB24: 200.0000 mg | ORAL_TABLET | Freq: Every day | ORAL | 1 refills | Status: DC

## 2021-01-20 NOTE — Telephone Encounter (Signed)
[facility] fax[facility] refill request for the following medications:   metoprolol (TOPROL-XL) 200 MG 24 hr tablet   Please advise.

## 2021-01-22 DIAGNOSIS — I2510 Atherosclerotic heart disease of native coronary artery without angina pectoris: Secondary | ICD-10-CM | POA: Diagnosis not present

## 2021-01-22 DIAGNOSIS — E782 Mixed hyperlipidemia: Secondary | ICD-10-CM | POA: Diagnosis not present

## 2021-01-22 DIAGNOSIS — E119 Type 2 diabetes mellitus without complications: Secondary | ICD-10-CM | POA: Diagnosis not present

## 2021-01-22 DIAGNOSIS — I10 Essential (primary) hypertension: Secondary | ICD-10-CM | POA: Diagnosis not present

## 2021-01-23 ENCOUNTER — Telehealth: Payer: Self-pay | Admitting: Family Medicine

## 2021-01-23 DIAGNOSIS — E7800 Pure hypercholesterolemia, unspecified: Secondary | ICD-10-CM

## 2021-01-23 MED ORDER — ATORVASTATIN CALCIUM 80 MG PO TABS: 80.0000 mg | ORAL_TABLET | Freq: Every day | ORAL | 1 refills | Status: DC

## 2021-01-23 MED ORDER — AMLODIPINE BESYLATE 10 MG PO TABS: 10.0000 mg | ORAL_TABLET | Freq: Every day | ORAL | 1 refills | Status: DC

## 2021-01-23 NOTE — Telephone Encounter (Signed)
[facility] refill request for the following medications:   atorvastatin (LIPITOR) 80 MG tablet   amLODipine (NORVASC) 10 MG tablet   Please advise.

## 2021-01-26 ENCOUNTER — Telehealth: Payer: Self-pay

## 2021-01-26 NOTE — Telephone Encounter (Signed)
Copied from [place] [id number]. Topic: General - Inquiry >> Jan 26, 2021 10:22 AM Oneta Rack wrote: Reason for CRM: Patient would like to know if PCP has samples of JANUMET  and empagliflozin (JARDIANCE), patient would like a follow up call

## 2021-01-27 NOTE — Telephone Encounter (Signed)
Dr. Caryn Section, is it ok to give samples of Jardiance? Arbie Cookey says that a few samples came in up front.

## 2021-01-27 NOTE — Telephone Encounter (Signed)
Arbie Cookey, do we have any samples up front?

## 2021-01-27 NOTE — Telephone Encounter (Signed)
I don't think we have any samples.

## 2021-01-28 NOTE — Telephone Encounter (Signed)
Ok, he can have a one months supply

## 2021-01-30 DIAGNOSIS — Z23 Encounter for immunization: Secondary | ICD-10-CM | POA: Diagnosis not present

## 2021-01-30 NOTE — Telephone Encounter (Signed)
Pt advised.   Thanks,   -Kaitlyn Skowron  

## 2021-01-30 NOTE — Telephone Encounter (Signed)
Patient give 1 month supply of Jardiance 25 mg. Samples.

## 2021-02-06 ENCOUNTER — Encounter: Payer: Self-pay | Admitting: Family Medicine

## 2021-02-06 ENCOUNTER — Other Ambulatory Visit: Payer: Self-pay

## 2021-02-06 ENCOUNTER — Ambulatory Visit (INDEPENDENT_AMBULATORY_CARE_PROVIDER_SITE_OTHER): Payer: Medicare HMO | Admitting: Family Medicine

## 2021-02-06 VITALS — BP 130/76 | HR 61 | Temp 98.7°F | Resp 16 | Ht 67.0 in | Wt 246.0 lb

## 2021-02-06 DIAGNOSIS — I10 Essential (primary) hypertension: Secondary | ICD-10-CM | POA: Diagnosis not present

## 2021-02-06 DIAGNOSIS — Z125 Encounter for screening for malignant neoplasm of prostate: Secondary | ICD-10-CM | POA: Diagnosis not present

## 2021-02-06 DIAGNOSIS — E7800 Pure hypercholesterolemia, unspecified: Secondary | ICD-10-CM

## 2021-02-06 DIAGNOSIS — E0821 Diabetes mellitus due to underlying condition with diabetic nephropathy: Secondary | ICD-10-CM | POA: Diagnosis not present

## 2021-02-06 DIAGNOSIS — I2510 Atherosclerotic heart disease of native coronary artery without angina pectoris: Secondary | ICD-10-CM | POA: Diagnosis not present

## 2021-02-06 NOTE — Patient Instructions (Signed)
Please review the attached list of medications and notify my office if there are any errors.   It is recommended to engage in 150 minutes of moderate exercise every week.

## 2021-02-06 NOTE — Progress Notes (Signed)
I,April Miller,acting as a scribe for [name], MD.,have documented all relevant documentation on the behalf of [name], MD,as directed by  [name], MD while in the presence of [name], MD.    Established patient visit   Patient: Todd Potter   DOB: 1950-06-11   70 y.o. Male  MRN: [id number] Visit Date: 02/06/2021  Today's healthcare provider: [name], MD   Chief Complaint  Patient presents with   Follow-up   Diabetes   Hypertension   Hyperlipidemia   Subjective    HPI  Diabetes Mellitus Type II, follow-up  Lab Results  Component Value Date   HGBA1C 7.0 (A) 10/03/2020   HGBA1C 6.3 (A) 07/14/2020   HGBA1C 6.6 (H) 01/07/2020   Last seen for diabetes 4 months ago.  Management since then includes; Fairly well controlled, but a1c up since last visit which he attributes to eating more white rice and potatoes. He is going to work on diet, refill- empagliflozin . He reports good compliance with treatment. He is not having side effects. none  Home blood sugar records: fasting range: not checking  Episodes of hypoglycemia? No none   Current insulin regiment: n/a Most Recent Eye Exam: 01/29/2020  --------------------------------------------------------------------------------------------------- Hypertension, follow-up  BP Readings from Last 3 Encounters:  02/06/21 130/76  10/03/20 121/67  07/14/20 (!) 143/80   Wt Readings from Last 3 Encounters:  02/06/21 246 lb (111.6 kg)  10/03/20 245 lb (111.1 kg)  07/14/20 243 lb (110.2 kg)     He was last seen for hypertension 4 months ago.  BP at that visit was 121/67. Management since that visit includes; Well controlled.  Continue current medications.   He reports good compliance with treatment. He is not having side effects. none He is not exercising. He is adherent to low salt diet.   Outside blood pressures are fasting range: not checking.  He does not smoke.  Use of agents  associated with hypertension: none.   --------------------------------------------------------------------------------------------------- Lipid/Cholesterol, follow-up  Last Lipid Panel: Lab Results  Component Value Date   CHOL 116 01/07/2020   LDLCALC 62 01/07/2020   HDL 38 (L) 01/07/2020   TRIG 79 01/07/2020    He was last seen for this 13 months ago.  Management since that visit includes; on atorvastatin.  He reports good compliance with treatment. He is not having side effects. none  He is following a Regular diet. Current exercise: none  Last metabolic panel Lab Results  Component Value Date   GLUCOSE 72 01/07/2020   NA 136 01/07/2020   K 3.9 01/07/2020   BUN 12 01/07/2020   CREATININE 1.04 01/07/2020   GFRNONAA 72 01/07/2020   CALCIUM 9.1 01/07/2020   AST 16 01/07/2020   ALT 21 01/07/2020   The ASCVD Risk score (Arnett DK, et al., 2019) failed to calculate for the following reasons:   The patient has a prior MI or stroke diagnosis  ---------------------------------------------------------------------------------------------------      Medications: Outpatient Medications Prior to Visit  Medication Sig   amLODipine (NORVASC) 10 MG tablet Take 1 tablet (10 mg total) by mouth daily.   aspirin 325 MG EC tablet Take 1 tablet (325 mg total) by mouth daily.   atorvastatin (LIPITOR) 80 MG tablet Take 1 tablet (80 mg total) by mouth daily.   empagliflozin (JARDIANCE) 25 MG TABS tablet Take 1 tablet (25 mg total) by mouth daily.   glipiZIDE (GLUCOTROL XL) 5 MG 24 hr tablet Take 1 tablet (5 mg total)  by mouth daily with breakfast.   losartan (COZAAR) 100 MG tablet TAKE 1 TABLET BY MOUTH EVERY DAY   metoprolol (TOPROL-XL) 200 MG 24 hr tablet Take 1 tablet (200 mg total) by mouth daily.   nitroGLYCERIN (NITROSTAT) 0.4 MG SL tablet Place 1 tablet (0.4 mg total) under the tongue every 5 (five) minutes as needed for chest pain. 3-5 minutes x3 as nedded    SitaGLIPtin-MetFORMIN HCl [id number] MG TB24 Take 1 tablet by mouth daily.   No facility-administered medications prior to visit.    Review of Systems  Constitutional:  Negative for appetite change, chills and fever.  Respiratory:  Negative for chest tightness, shortness of breath and wheezing.   Cardiovascular:  Negative for chest pain and palpitations.  Gastrointestinal:  Negative for abdominal pain, nausea and vomiting.      Objective    BP 130/76 (BP Location: Left Arm, Patient Position: Sitting, Cuff Size: Large)   Pulse 61   Temp 98.7 F (37.1 C) (Temporal)   Resp 16   Ht 5\' 7"  (1.702 m)   Wt 246 lb (111.6 kg)   SpO2 98%   BMI 38.53 kg/m  {Show previous vital signs (optional):23777}  Physical Exam   General: Appearance:    Obese male in no acute distress  Eyes:    PERRL, conjunctiva/corneas clear, EOM's intact       Lungs:     Clear to auscultation bilaterally, respirations unlabored  Heart:    Normal heart rate. Normal rhythm. No murmurs, rubs, or gallops.    MS:   All extremities are intact.    Neurologic:   Awake, alert, oriented x 3. No apparent focal neurological defect.          Assessment & Plan     1. Diabetes mellitus due to underlying condition, controlled, with diabetic nephropathy, without long-term current use of insulin (HCC)  - Hemoglobin A1c  2. Essential hypertension Well controlled.  Continue current medications.    3. Pure hypercholesterolemia He is tolerating atorvastatin well with no adverse effects.   - CBC - Comprehensive metabolic panel - Lipid panel  4. Morbid obesity ([place]) Encouraged regular exercise.   5. Coronary artery disease involving native coronary artery of native heart without angina pectoris Asymptomatic. Compliant with medication.  Continue aggressive risk factor modification.  Continue routine follow up [facility] cardiology.   6. Prostate cancer screening  - PSA Total (Reflex To Free) ([facility] only)      The entirety  of the information documented in the History of Present Illness, Review of Systems and Physical Exam were personally obtained by me. Portions of this information were initially documented by the CMA and reviewed by me for thoroughness and accuracy.     [name], MD  [facility] [phone] (phone) [phone] (fax)  [place]

## 2021-02-07 LAB — CBC
Hematocrit: 45.3 % (ref 37.5–51.0)
Hemoglobin: 14.8 g/dL (ref 13.0–17.7)
MCH: 27.4 pg (ref 26.6–33.0)
MCHC: 32.7 g/dL (ref 31.5–35.7)
MCV: 84 fL (ref 79–97)
Platelets: 189 10*3/uL (ref 150–450)
RBC: 5.4 x10E6/uL (ref 4.14–5.80)
RDW: 13.8 % (ref 11.6–15.4)
WBC: 9.6 10*3/uL (ref 3.4–10.8)

## 2021-02-07 LAB — COMPREHENSIVE METABOLIC PANEL
ALT: 14 IU/L (ref 0–44)
AST: 14 IU/L (ref 0–40)
Albumin/Globulin Ratio: 1 — ABNORMAL LOW (ref 1.2–2.2)
Albumin: 4 g/dL (ref 3.7–4.7)
Alkaline Phosphatase: 144 IU/L — ABNORMAL HIGH (ref 44–121)
BUN/Creatinine Ratio: 9 — ABNORMAL LOW (ref 10–24)
BUN: 12 mg/dL (ref 8–27)
Bilirubin Total: 0.7 mg/dL (ref 0.0–1.2)
CO2: 22 mmol/L (ref 20–29)
Calcium: 9.3 mg/dL (ref 8.6–10.2)
Chloride: 103 mmol/L (ref 96–106)
Creatinine, Ser: 1.38 mg/dL — ABNORMAL HIGH (ref 0.76–1.27)
Globulin, Total: 3.9 g/dL (ref 1.5–4.5)
Glucose: 141 mg/dL — ABNORMAL HIGH (ref 70–99)
Potassium: 4.5 mmol/L (ref 3.5–5.2)
Sodium: 140 mmol/L (ref 134–144)
Total Protein: 7.9 g/dL (ref 6.0–8.5)
eGFR: 55 mL/min/{1.73_m2} — ABNORMAL LOW (ref >59)

## 2021-02-07 LAB — LIPID PANEL
Chol/HDL Ratio: 3.9 ratio (ref 0.0–5.0)
Cholesterol, Total: 135 mg/dL (ref 100–199)
HDL: 35 mg/dL — ABNORMAL LOW (ref >39)
LDL Chol Calc (NIH): 84 mg/dL (ref 0–99)
Triglycerides: 79 mg/dL (ref 0–149)
VLDL Cholesterol Cal: 16 mg/dL (ref 5–40)

## 2021-02-07 LAB — HEMOGLOBIN A1C
Est. average glucose Bld gHb Est-mCnc: 177 mg/dL
Hgb A1c MFr Bld: 7.8 % — ABNORMAL HIGH (ref 4.8–5.6)

## 2021-02-07 LAB — PSA TOTAL (REFLEX TO FREE): Prostate Specific Ag, Serum: 1 ng/mL (ref 0.0–4.0)

## 2021-02-25 ENCOUNTER — Telehealth: Payer: Self-pay

## 2021-02-25 NOTE — Telephone Encounter (Signed)
Patient stopped by the office asking for samples of [place].   Told him that we did not get samples of that any longer but I'd check to see if there were any left.  Please let patient know.

## 2021-02-26 ENCOUNTER — Telehealth: Payer: Self-pay | Admitting: Family Medicine

## 2021-02-26 DIAGNOSIS — I10 Essential (primary) hypertension: Secondary | ICD-10-CM

## 2021-02-26 MED ORDER — LOSARTAN POTASSIUM 100 MG PO TABS: 100.0000 mg | ORAL_TABLET | Freq: Every day | ORAL | 1 refills | Status: DC

## 2021-02-26 NOTE — Telephone Encounter (Signed)
[facility] fax[facility] refill request for the following medications:  losartan (COZAAR) 100 MG tablet    Please advise.

## 2021-02-26 NOTE — Telephone Encounter (Signed)
Pt advised that we do not have samples at this time.   Thanks,   -Mickel Baas

## 2021-02-28 ENCOUNTER — Other Ambulatory Visit: Payer: Self-pay | Admitting: Family Medicine

## 2021-02-28 DIAGNOSIS — E0821 Diabetes mellitus due to underlying condition with diabetic nephropathy: Secondary | ICD-10-CM

## 2021-03-01 NOTE — Telephone Encounter (Signed)
Requested Prescriptions  Pending Prescriptions Disp Refills  . glipiZIDE (GLUCOTROL XL) 5 MG 24 hr tablet [Pharmacy Med Name: GLIPIZIDE XL TAB 5MG ] 90 tablet 1    Sig: TAKE 1 TABLET DAILY WITH   BREAKFAST     Endocrinology:  Diabetes - Sulfonylureas Passed - 02/28/2021  2:12 PM      Passed - HBA1C is between 0 and 7.9 and within 180 days    Hgb A1c MFr Bld  Date Value Ref Range Status  02/06/2021 7.8 (H) 4.8 - 5.6 % Final    Comment:             Prediabetes: 5.7 - 6.4          Diabetes: >6.4          Glycemic control for adults with diabetes: <7.0          Passed - Valid encounter within last 6 months    Recent Outpatient Visits          3 weeks ago Diabetes mellitus due to underlying condition, controlled, with diabetic nephropathy, without long-term current use of insulin ([place])   [facility] [name], MD   4 months ago Diabetes mellitus due to underlying condition, controlled, with diabetic nephropathy, without long-term current use of insulin ([place])   [facility] [name], MD   7 months ago Diabetes mellitus due to underlying condition, controlled, with diabetic nephropathy, without long-term current use of insulin ([place])   [facility] [name], MD   1 year ago History of cerebrovascular accident (CVA) with residual deficit   [facility] [name], MD   1 year ago Bursitis of left shoulder   [facility] [name], MD      Future Appointments            In 3 months Fisher, Kirstie Peri, MD [facility], [place]

## 2021-04-01 ENCOUNTER — Telehealth: Payer: Self-pay

## 2021-04-01 DIAGNOSIS — E0821 Diabetes mellitus due to underlying condition with diabetic nephropathy: Secondary | ICD-10-CM

## 2021-04-01 DIAGNOSIS — E1121 Type 2 diabetes mellitus with diabetic nephropathy: Secondary | ICD-10-CM

## 2021-04-01 NOTE — Telephone Encounter (Signed)
We dont have samples of Janumet but we do have 4 boxes of Jardiance 25mg .  Is it okay if I give them to Mr. Bledsoe?   Thanks,   -Mickel Baas

## 2021-04-01 NOTE — Telephone Encounter (Signed)
Patient wants samples of Jardiance and [place]. Has been out of Janumet for 1 week and will run out of Jardiance this week.  CB # [phone]  [facility] [address] if no samples available.

## 2021-04-02 MED ORDER — EMPAGLIFLOZIN 25 MG PO TABS: 25.0000 mg | ORAL_TABLET | Freq: Every day | ORAL | 0 refills | Status: DC

## 2021-04-02 MED ORDER — SITAGLIP PHOS-METFORMIN HCL ER 100-1000 MG PO TB24: 1.0000 | ORAL_TABLET | Freq: Every day | ORAL | 5 refills | Status: DC

## 2021-04-02 NOTE — Telephone Encounter (Signed)
Patient advised. Samples placed up front for pick up.  

## 2021-04-02 NOTE — Telephone Encounter (Signed)
Have sent janumet prescription, he can have the 4 sample boxes of Jardiance. Thanks!

## 2021-04-29 ENCOUNTER — Telehealth: Payer: Self-pay

## 2021-04-29 NOTE — Telephone Encounter (Signed)
He can have 3 weeks samples if we have them.

## 2021-04-29 NOTE — Telephone Encounter (Signed)
Please advise if OK to give samples.

## 2021-04-29 NOTE — Telephone Encounter (Signed)
Patient stopped by office asking for Jardiance 25 mg. And Janumet samples. Please let patient know if we have these

## 2021-04-30 NOTE — Telephone Encounter (Signed)
Left samples of Jardiance at the front desk.  We are out of [place].  Pt advised.   Thanks,   -Mickel Baas

## 2021-06-12 ENCOUNTER — Ambulatory Visit: Payer: Medicare HMO | Admitting: Family Medicine

## 2021-06-18 NOTE — Progress Notes (Signed)
?  ? ?I,Roshena L Chambers,acting as a scribe for [name], MD.,have documented all relevant documentation on the behalf of [name], MD,as directed by  [name], MD while in the presence of [name], MD.  ? ?Established patient visit ? ? ?Patient: Todd Potter   DOB: [birth year]   71 y.o. Male  MRN: [id number] ?Visit Date: 06/19/2021 ? ?Today's healthcare provider: [name], MD  ? ?Chief Complaint  ?Patient presents with  ? Diabetes  ? Hypertension  ? ?Subjective  ?  ?HPI  ?Diabetes Mellitus Type II, Follow-up ? ?Lab Results  ?Component Value Date  ? HGBA1C 8.1 (A) 06/19/2021  ? HGBA1C 7.8 (H) 02/06/2021  ? HGBA1C 7.0 (A) 10/03/2020  ? ?Wt Readings from Last 3 Encounters:  ?06/19/21 251 lb (113.9 kg)  ?02/06/21 246 lb (111.6 kg)  ?10/03/20 245 lb (111.1 kg)  ? ?Last seen for diabetes 4 months ago.  ?Management since then includes advising patient to cut back on sweets and starches in diet, and try to exercise an average of 30 minutes a day. ?He reports good compliance with treatment. ?He is not having side effects.  ?Symptoms: ?No fatigue No foot ulcerations  ?No appetite changes No nausea  ?No paresthesia of the feet  No polydipsia  ?No polyuria No visual disturbances   ?No vomiting   ? ? ?Home blood sugar records:  blood sugars are not checked ? ?Episodes of hypoglycemia? No  ?  ?Current insulin regiment: none ?Most Recent Eye Exam: >1 year ago ?Current exercise: none ?Current diet habits: in general, an "unhealthy" diet ? ?Pertinent Labs: ?Lab Results  ?Component Value Date  ? CHOL 135 02/06/2021  ? HDL 35 (L) 02/06/2021  ? [place] 84 02/06/2021  ? TRIG 79 02/06/2021  ? CHOLHDL 3.9 02/06/2021  ? Lab Results  ?Component Value Date  ? NA 140 02/06/2021  ? K 4.5 02/06/2021  ? CREATININE 1.38 (H) 02/06/2021  ? EGFR 55 (L) 02/06/2021  ? MICROALBUR 20 12/03/2016  ?  ? ?---------------------------------------------------------------------------------------------------  ? ?Hypertension,  follow-up ? ?BP Readings from Last 3 Encounters:  ?06/19/21 137/77  ?02/06/21 130/76  ?10/03/20 121/67  ? Wt Readings from Last 3 Encounters:  ?06/19/21 251 lb (113.9 kg)  ?02/06/21 246 lb (111.6 kg)  ?10/03/20 245 lb (111.1 kg)  ?  ? ?He was last seen for hypertension 4 months ago.  ?BP at that visit was 130/76. Management since that visit includes advising patient to try drinking 2-3 more glasses of water every day to improve kidney functions ? ?He reports good compliance with treatment. He has not increased his water intake. ?He is not having side effects.  ?He is following a Regular diet. ?He is not exercising. ?He does not smoke. ? ?Use of agents associated with hypertension: NSAIDS.  ? ?Outside blood pressures are not checked. ?Symptoms: ?No chest pain No chest pressure  ?No palpitations No syncope  ?No dyspnea No orthopnea  ?No paroxysmal nocturnal dyspnea No lower extremity edema  ? ?Pertinent labs: ?Lab Results  ?Component Value Date  ? CHOL 135 02/06/2021  ? HDL 35 (L) 02/06/2021  ? [place] 84 02/06/2021  ? TRIG 79 02/06/2021  ? CHOLHDL 3.9 02/06/2021  ? Lab Results  ?Component Value Date  ? NA 140 02/06/2021  ? K 4.5 02/06/2021  ? CREATININE 1.38 (H) 02/06/2021  ? EGFR 55 (L) 02/06/2021  ? GLUCOSE 141 (H) 02/06/2021  ? TSH 0.468 04/01/2016  ?  ? ?The ASCVD Risk score (Arnett DK,  et al., 2019) failed to calculate for the following reasons: ?  The patient has a prior MI or stroke diagnosis  ? ?---------------------------------------------------------------------------------------------------  ? ?Medications: ?Outpatient Medications Prior to Visit  ?Medication Sig  ? amLODipine (NORVASC) 10 MG tablet Take 1 tablet (10 mg total) by mouth daily.  ? aspirin 325 MG EC tablet Take 1 tablet (325 mg total) by mouth daily.  ? atorvastatin (LIPITOR) 80 MG tablet Take 1 tablet (80 mg total) by mouth daily.  ? empagliflozin (JARDIANCE) 25 MG TABS tablet Take 1 tablet (25 mg total) by mouth daily.  ? glipiZIDE  (GLUCOTROL XL) 5 MG 24 hr tablet TAKE 1 TABLET DAILY WITH   BREAKFAST  ? losartan (COZAAR) 100 MG tablet Take 1 tablet (100 mg total) by mouth daily.  ? metoprolol (TOPROL-XL) 200 MG 24 hr tablet Take 1 tablet (200 mg total) by mouth daily.  ? nitroGLYCERIN (NITROSTAT) 0.4 MG SL tablet Place 1 tablet (0.4 mg total) under the tongue every 5 (five) minutes as needed for chest pain. 3-5 minutes x3 as nedded  ? SitaGLIPtin-MetFORMIN HCl [phone] MG TB24 Take 1 tablet by mouth daily.  ? ?No facility-administered medications prior to visit.  ? ? ?Review of Systems  ?Constitutional:  Negative for appetite change, chills and fever.  ?Respiratory:  Negative for chest tightness, shortness of breath and wheezing.   ?Cardiovascular:  Negative for chest pain and palpitations.  ?Gastrointestinal:  Negative for abdominal pain, nausea and vomiting.  ? ? ?  Objective  ?  ?BP 137/77 (BP Location: Left Arm, Patient Position: Sitting, Cuff Size: Large)   Pulse 64   Temp 98.7 ?F (37.1 ?C) (Oral)   Resp 14   Wt 251 lb (113.9 kg)   SpO2 96% Comment: room air  BMI 39.31 kg/m?  ? ? ?Physical Exam  ?General appearance: Mildly obese male, cooperative and in no acute distress ?Head: Normocephalic, without obvious abnormality, atraumatic ?Respiratory: Respirations even and unlabored, normal respiratory rate ?Extremities: All extremities are intact.  ?Skin: Skin color, texture, turgor normal. No rashes seen  ?Nasal: Hyperemic nasal septum on right ?Neurologic: Mental status: Alert, oriented to person, place, and time, thought content appropriate.  ? ?Results for orders placed or performed in visit on 06/19/21  ?POCT HgB A1C  ?Result Value Ref Range  ? Hemoglobin A1C 8.1 (A) 4.0 - 5.6 %  ? Est. average glucose Bld gHb Est-mCnc 186   ? ? Assessment & Plan  ?  ? ? ?1. Controlled type 2 diabetes mellitus with diabetic nephropathy, without long-term current use of insulin ([place]) ?A1c is up today, has not been following diet as strictly. Is going  to work on diet and losing weight he has gaines over the winter.  ?Continue current medications.   ? ?2. Morbid obesity ([place]) ?Is to work on losing weight he gaines over the summer ? ?3. Epistaxis ?Has had intermittently for years and not improving with OTC Vaseline or Neosporin ointment. May be more inflamed due to pollens. He can reduced aspirin to QOD until better.  ? ?Follow up 4 months.   ?  ?   ? ?The entirety of the information documented in the History of Present Illness, Review of Systems and Physical Exam were personally obtained by me. Portions of this information were initially documented by the CMA and reviewed by me for thoroughness and accuracy.   ? ? ?[name], MD  ?[facility] ?[phone] (phone) ?[phone] (fax) ? ?[facility]  ?

## 2021-06-19 ENCOUNTER — Other Ambulatory Visit: Payer: Self-pay

## 2021-06-19 ENCOUNTER — Ambulatory Visit (INDEPENDENT_AMBULATORY_CARE_PROVIDER_SITE_OTHER): Payer: Medicare HMO | Admitting: Family Medicine

## 2021-06-19 ENCOUNTER — Encounter: Payer: Self-pay | Admitting: Family Medicine

## 2021-06-19 VITALS — BP 137/77 | HR 64 | Temp 98.7°F | Resp 14 | Wt 251.0 lb

## 2021-06-19 DIAGNOSIS — E1121 Type 2 diabetes mellitus with diabetic nephropathy: Secondary | ICD-10-CM

## 2021-06-19 DIAGNOSIS — E0821 Diabetes mellitus due to underlying condition with diabetic nephropathy: Secondary | ICD-10-CM

## 2021-06-19 DIAGNOSIS — R040 Epistaxis: Secondary | ICD-10-CM

## 2021-06-19 LAB — POCT GLYCOSYLATED HEMOGLOBIN (HGB A1C)
Est. average glucose Bld gHb Est-mCnc: 186
Hemoglobin A1C: 8.1 % — AB (ref 4.0–5.6)

## 2021-06-19 NOTE — Patient Instructions (Addendum)
Please review the attached list of medications and notify my office if there are any errors.  ? ?Please bring all of your medications to every appointment so we can make sure that our medication list is the same as yours.  ? ?You can reduced aspirin to 1 tablet every OTHER day when you have more nosebleeds than usual ?

## 2021-07-03 DIAGNOSIS — I10 Essential (primary) hypertension: Secondary | ICD-10-CM | POA: Diagnosis not present

## 2021-07-03 DIAGNOSIS — E1121 Type 2 diabetes mellitus with diabetic nephropathy: Secondary | ICD-10-CM | POA: Diagnosis not present

## 2021-07-03 DIAGNOSIS — I2510 Atherosclerotic heart disease of native coronary artery without angina pectoris: Secondary | ICD-10-CM | POA: Diagnosis not present

## 2021-07-03 DIAGNOSIS — E782 Mixed hyperlipidemia: Secondary | ICD-10-CM | POA: Diagnosis not present

## 2021-07-22 ENCOUNTER — Telehealth: Payer: Self-pay | Admitting: Family Medicine

## 2021-07-22 DIAGNOSIS — E7800 Pure hypercholesterolemia, unspecified: Secondary | ICD-10-CM

## 2021-07-22 DIAGNOSIS — I10 Essential (primary) hypertension: Secondary | ICD-10-CM

## 2021-07-22 MED ORDER — METOPROLOL SUCCINATE ER 200 MG PO TB24: 200.0000 mg | ORAL_TABLET | Freq: Every day | ORAL | 1 refills | Status: DC

## 2021-07-22 MED ORDER — AMLODIPINE BESYLATE 10 MG PO TABS: 10.0000 mg | ORAL_TABLET | Freq: Every day | ORAL | 1 refills | Status: DC

## 2021-07-22 MED ORDER — ATORVASTATIN CALCIUM 80 MG PO TABS: 80.0000 mg | ORAL_TABLET | Freq: Every day | ORAL | 1 refills | Status: DC

## 2021-07-22 NOTE — Telephone Encounter (Signed)
[facility] refill request for the following medications: ? ?amLODipine (NORVASC) 10 MG tablet  ? ?atorvastatin (LIPITOR) 80 MG tablet  ? ?metoprolol (TOPROL-XL) 200 MG 24 hr tablet  ? ?Please advise. ? ?

## 2021-07-22 NOTE — Addendum Note (Signed)
Addended by: Randal Buba on: 07/22/2021 04:24 PM ? ? Modules accepted: Orders ? ?

## 2021-09-02 ENCOUNTER — Telehealth: Payer: Self-pay

## 2021-09-02 NOTE — Telephone Encounter (Signed)
Pt informed I will leave 2 boxed of jardiance up front for him to pick up.  We do not have Janumet in his dose.   ?

## 2021-09-02 NOTE — Telephone Encounter (Signed)
Patient is asking for samples of Jardiance 25 mg. and Janumet. Please let patient know if he can get those.

## 2021-09-03 NOTE — Progress Notes (Unsigned)
I,Jana Robinson,acting as a scribe for [name], MD.,have documented all relevant documentation on the behalf of [name], MD,as directed by  [name], MD while in the presence of [name], MD.  Established patient visit   Patient: Todd Potter   DOB: 1950-06-06   71 y.o. Male  MRN: [id number] Visit Date: 09/07/2021  Today's healthcare provider: [name], MD   Chief Complaint  Patient presents with   Diabetes   Subjective    HPI  Diabetes Mellitus Type II, Follow-up  Lab Results  Component Value Date   HGBA1C 8.1 (A) 06/19/2021   HGBA1C 7.8 (H) 02/06/2021   HGBA1C 7.0 (A) 10/03/2020   Wt Readings from Last 3 Encounters:  09/07/21 248 lb 6.4 oz (112.7 kg)  06/19/21 251 lb (113.9 kg)  02/06/21 246 lb (111.6 kg)   Last seen for diabetes 2 months ago.  Management since then includes Is going to work on diet and losing weight he has gaines over the winter.  Continue current medications.  Marland Kitchen He reports excellent compliance with treatment. He is not having side effects. No fatigue No foot ulcerations  No appetite changes No nausea  No paresthesia of the feet  No polydipsia  No polyuria No visual disturbances   No vomiting     Home blood sugar records:does not check  Episodes of hypoglycemia? No   Current insulin regiment: none Most Recent Eye Exam: 01-29-20/ pt has appt tomorrow 09-08-21 Current exercise: none Current diet habits: in general, a "healthy" diet    Pertinent Labs: Lab Results  Component Value Date   CHOL 135 02/06/2021   HDL 35 (L) 02/06/2021   LDLCALC 84 02/06/2021   TRIG 79 02/06/2021   CHOLHDL 3.9 02/06/2021   Lab Results  Component Value Date   NA 140 02/06/2021   K 4.5 02/06/2021   CREATININE 1.38 (H) 02/06/2021   EGFR 55 (L) 02/06/2021   MICROALBUR 20 12/03/2016     ---------------------------------------------------------------------------------------------------  Hypertension, follow-up  BP Readings  from Last 3 Encounters:  09/07/21 134/72  06/19/21 137/77  02/06/21 130/76   Wt Readings from Last 3 Encounters:  09/07/21 248 lb 6.4 oz (112.7 kg)  06/19/21 251 lb (113.9 kg)  02/06/21 246 lb (111.6 kg)     He had follow up with cardiology in March and losartan was changed to telmisartan which he is tolerating very well  He reports excellent compliance with treatment. He is not having side effects.    Pertinent labs Lab Results  Component Value Date   NA 140 02/06/2021   K 4.5 02/06/2021   CREATININE 1.38 (H) 02/06/2021   EGFR 55 (L) 02/06/2021   GLUCOSE 141 (H) 02/06/2021   TSH 0.468 04/01/2016     The ASCVD Risk score (Arnett DK, et al., 2019) failed to calculate for the following reasons:   The patient has a prior MI or stroke diagnosis  ---------------------------------------------------------------------------------------------------   Medications: Outpatient Medications Prior to Visit  Medication Sig   amLODipine (NORVASC) 10 MG tablet Take 1 tablet (10 mg total) by mouth daily.   aspirin 325 MG EC tablet Take 1 tablet (325 mg total) by mouth daily.   atorvastatin (LIPITOR) 80 MG tablet Take 1 tablet (80 mg total) by mouth daily.   empagliflozin (JARDIANCE) 25 MG TABS tablet Take 1 tablet (25 mg total) by mouth daily.   Telmisartan (MICARDIS) 50 MG TABS Take 1 tablet ($RemoveB'80mg'zlFZfaxM$  total) by mouth daily   glipiZIDE (GLUCOTROL  XL) 5 MG 24 hr tablet TAKE 1 TABLET DAILY WITH   BREAKFAST   metoprolol (TOPROL-XL) 200 MG 24 hr tablet Take 1 tablet (200 mg total) by mouth daily.   nitroGLYCERIN (NITROSTAT) 0.4 MG SL tablet Place 1 tablet (0.4 mg total) under the tongue every 5 (five) minutes as needed for chest pain. 3-5 minutes x3 as nedded   SitaGLIPtin-MetFORMIN HCl [phone] MG TB24 Take 1 tablet by mouth daily.   No facility-administered medications prior to visit.         Objective    BP 134/72 (BP Location: Left Arm, Patient Position: Sitting, Cuff Size: Normal)    Pulse 74   Temp 97.9 F (36.6 C) (Oral)   Resp 16   Wt 248 lb 6.4 oz (112.7 kg)   SpO2 95%   BMI 38.90 kg/m    Physical Exam   General: Appearance:    Obese male in no acute distress  Eyes:    PERRL, conjunctiva/corneas clear, EOM's intact       Lungs:     Clear to auscultation bilaterally, respirations unlabored  Heart:    Normal heart rate. Normal rhythm. No murmurs, rubs, or gallops.    MS:   All extremities are intact.    Neurologic:   Awake, alert, oriented x 3. No apparent focal neurological defect.          Results for orders placed or performed in visit on 09/07/21  POCT glycosylated hemoglobin (Hb A1C)  Result Value Ref Range   Hemoglobin A1C 7.3 (A) 4.0 - 5.6 %   Est. average glucose Bld gHb Est-mCnc 163      Assessment & Plan     1. Controlled type 2 diabetes mellitus with diabetic nephropathy, without long-term current use of insulin ([place]) Well controlled.  Continue current medications.    2. Hypertension Well controlled.  Tolerating change from losartan to telmisartan. Continue current medications.  Continue routine cardiology follow up as scheduled.   Future Appointments  Date Time Provider [place]  01/22/2022  8:20 AM [name], MD [facility]        The entirety of the information documented in the History of Present Illness, Review of Systems and Physical Exam were personally obtained by me. Portions of this information were initially documented by the CMA and reviewed by me for thoroughness and accuracy.     [name], MD  [facility] [phone] (phone) [phone] (fax)  [place]

## 2021-09-07 ENCOUNTER — Encounter: Payer: Self-pay | Admitting: Family Medicine

## 2021-09-07 ENCOUNTER — Ambulatory Visit (INDEPENDENT_AMBULATORY_CARE_PROVIDER_SITE_OTHER): Payer: Medicare HMO | Admitting: Family Medicine

## 2021-09-07 VITALS — BP 134/72 | HR 74 | Temp 97.9°F | Resp 16 | Wt 248.4 lb

## 2021-09-07 DIAGNOSIS — E1121 Type 2 diabetes mellitus with diabetic nephropathy: Secondary | ICD-10-CM | POA: Diagnosis not present

## 2021-09-07 LAB — POCT GLYCOSYLATED HEMOGLOBIN (HGB A1C)
Est. average glucose Bld gHb Est-mCnc: 163
Hemoglobin A1C: 7.3 % — AB (ref 4.0–5.6)

## 2021-09-07 NOTE — Patient Instructions (Signed)
Please review the attached list of medications and notify my office if there are any errors.    

## 2021-09-08 DIAGNOSIS — E119 Type 2 diabetes mellitus without complications: Secondary | ICD-10-CM | POA: Diagnosis not present

## 2021-09-14 ENCOUNTER — Other Ambulatory Visit: Payer: Self-pay | Admitting: Family Medicine

## 2021-09-14 DIAGNOSIS — E0821 Diabetes mellitus due to underlying condition with diabetic nephropathy: Secondary | ICD-10-CM

## 2021-09-25 ENCOUNTER — Ambulatory Visit: Payer: Medicare HMO | Admitting: Family Medicine

## 2021-09-25 DIAGNOSIS — E1121 Type 2 diabetes mellitus with diabetic nephropathy: Secondary | ICD-10-CM

## 2021-09-29 ENCOUNTER — Telehealth: Payer: Self-pay | Admitting: Family Medicine

## 2021-09-29 NOTE — Telephone Encounter (Signed)
We do have samples available. Please advise if ok to give samples.

## 2021-09-29 NOTE — Telephone Encounter (Signed)
Patient is asking for samples of Jardiance 25 mg.

## 2021-10-01 NOTE — Telephone Encounter (Signed)
That's fine, he can have 1 months samples.

## 2021-10-01 NOTE — Telephone Encounter (Signed)
Pt informed samples are ready at front desk for pick up.

## 2021-10-29 ENCOUNTER — Other Ambulatory Visit: Payer: Self-pay | Admitting: Family Medicine

## 2021-10-29 DIAGNOSIS — E1121 Type 2 diabetes mellitus with diabetic nephropathy: Secondary | ICD-10-CM

## 2021-10-29 MED ORDER — SITAGLIP PHOS-METFORMIN HCL ER 100-1000 MG PO TB24: 1.0000 | ORAL_TABLET | Freq: Every day | ORAL | 5 refills | Status: DC

## 2021-10-29 MED ORDER — EMPAGLIFLOZIN 25 MG PO TABS: 25.0000 mg | ORAL_TABLET | Freq: Every day | ORAL | 5 refills | Status: DC

## 2021-10-29 NOTE — Telephone Encounter (Signed)
Patient come in and would like samples of   JANUMET 50-1000 MG tablet   empagliflozin (JARDIANCE) 25 MG TABS tablet    Please advise

## 2021-10-29 NOTE — Addendum Note (Signed)
Addended by: Shawna Orleans on: 10/29/2021 12:00 PM   Modules accepted: Orders

## 2021-12-04 ENCOUNTER — Telehealth: Payer: Self-pay | Admitting: *Deleted

## 2021-12-04 DIAGNOSIS — E1121 Type 2 diabetes mellitus with diabetic nephropathy: Secondary | ICD-10-CM

## 2021-12-04 MED ORDER — EMPAGLIFLOZIN 25 MG PO TABS: 25.0000 mg | ORAL_TABLET | Freq: Every day | ORAL | 0 refills | Status: DC

## 2021-12-04 NOTE — Telephone Encounter (Signed)
Samples placed up front for pick up. Patient advised.

## 2021-12-04 NOTE — Telephone Encounter (Signed)
He takes '25mg'$  tabs, he can have a months worth if we have them.

## 2021-12-04 NOTE — Telephone Encounter (Signed)
Please advise 

## 2021-12-04 NOTE — Telephone Encounter (Signed)
Copied from [place] [id number]. Topic: General - Other >> Dec 04, 2021 11:21 AM [id number] J wrote: Reason for CRM: pt called in to ask if provider has samples of Jardiance? Please assist pt further.

## 2022-01-01 ENCOUNTER — Telehealth: Payer: Self-pay

## 2022-01-01 DIAGNOSIS — E1121 Type 2 diabetes mellitus with diabetic nephropathy: Secondary | ICD-10-CM

## 2022-01-01 NOTE — Telephone Encounter (Signed)
Per front office staff, patient came by the office yesterday requesting samples of [place] and Jardiance. Please advise if ok to give samples if we have them available.

## 2022-01-04 MED ORDER — EMPAGLIFLOZIN 25 MG PO TABS: 25.0000 mg | ORAL_TABLET | Freq: Every day | ORAL | 0 refills | Status: DC

## 2022-01-04 NOTE — Telephone Encounter (Signed)
Jardiance samples available. Samples placed up front.

## 2022-01-04 NOTE — Telephone Encounter (Signed)
Can have a months samples of Jardiance 25 and Janumet [phone] if we have them

## 2022-01-08 DIAGNOSIS — Z6841 Body Mass Index (BMI) 40.0 and over, adult: Secondary | ICD-10-CM | POA: Diagnosis not present

## 2022-01-08 DIAGNOSIS — I110 Hypertensive heart disease with heart failure: Secondary | ICD-10-CM | POA: Diagnosis not present

## 2022-01-08 DIAGNOSIS — I252 Old myocardial infarction: Secondary | ICD-10-CM | POA: Diagnosis not present

## 2022-01-08 DIAGNOSIS — Z7982 Long term (current) use of aspirin: Secondary | ICD-10-CM | POA: Diagnosis not present

## 2022-01-08 DIAGNOSIS — E785 Hyperlipidemia, unspecified: Secondary | ICD-10-CM | POA: Diagnosis not present

## 2022-01-08 DIAGNOSIS — N529 Male erectile dysfunction, unspecified: Secondary | ICD-10-CM | POA: Diagnosis not present

## 2022-01-08 DIAGNOSIS — I69354 Hemiplegia and hemiparesis following cerebral infarction affecting left non-dominant side: Secondary | ICD-10-CM | POA: Diagnosis not present

## 2022-01-08 DIAGNOSIS — I509 Heart failure, unspecified: Secondary | ICD-10-CM | POA: Diagnosis not present

## 2022-01-08 DIAGNOSIS — Z7984 Long term (current) use of oral hypoglycemic drugs: Secondary | ICD-10-CM | POA: Diagnosis not present

## 2022-01-08 DIAGNOSIS — I2510 Atherosclerotic heart disease of native coronary artery without angina pectoris: Secondary | ICD-10-CM | POA: Diagnosis not present

## 2022-01-08 DIAGNOSIS — E1142 Type 2 diabetes mellitus with diabetic polyneuropathy: Secondary | ICD-10-CM | POA: Diagnosis not present

## 2022-01-09 LAB — MICROALBUMIN / CREATININE URINE RATIO: Microalb Creat Ratio: <30

## 2022-01-09 LAB — PROTEIN / CREATININE RATIO, URINE
Albumin, U: 30
Creatinine, Urine: 3

## 2022-01-19 ENCOUNTER — Encounter: Payer: Self-pay | Admitting: Family Medicine

## 2022-01-20 NOTE — Progress Notes (Signed)
I,Roshena L Chambers,acting as a scribe for [name], MD.,have documented all relevant documentation on the behalf of [name], MD,as directed by  [name], MD while in the presence of [name], MD.    Established patient visit   Patient: Todd Potter   DOB: 01/18/1951   71 y.o. Male  MRN: [id number] Visit Date: 01/22/2022  Today's healthcare provider: [name], MD   Chief Complaint  Patient presents with   Diabetes   Hypertension   Subjective    HPI  Diabetes Mellitus Type II, Follow-up  Lab Results  Component Value Date   HGBA1C 7.9 (A) 01/22/2022   HGBA1C 7.3 (A) 09/07/2021   HGBA1C 8.1 (A) 06/19/2021   Wt Readings from Last 3 Encounters:  01/22/22 244 lb (110.7 kg)  09/07/21 248 lb 6.4 oz (112.7 kg)  06/19/21 251 lb (113.9 kg)   Last seen for diabetes 4 months ago.  Management since then includes continuing same medication. He reports good compliance with treatment. He is not having side effects.  Symptoms: No fatigue No foot ulcerations  No appetite changes No nausea  No paresthesia of the feet  No polydipsia  No polyuria No visual disturbances   No vomiting     Home blood sugar records:  blood sugars are not checked  Episodes of hypoglycemia? No    Current insulin regiment: none Most Recent Eye Exam: 1 year ago Current exercise: none Current diet habits: in general, an "unhealthy" diet  Pertinent Labs: Lab Results  Component Value Date   CHOL 135 02/06/2021   HDL 35 (L) 02/06/2021   LDLCALC 84 02/06/2021   TRIG 79 02/06/2021   CHOLHDL 3.9 02/06/2021   Lab Results  Component Value Date   NA 140 02/06/2021   K 4.5 02/06/2021   CREATININE 1.38 (H) 02/06/2021   EGFR 55 (L) 02/06/2021   MICROALBUR 20 12/03/2016     ---------------------------------------------------------------------------------------------------   Hypertension, follow-up  BP Readings from Last 3 Encounters:  01/22/22 (!) 173/98  09/07/21  134/72  06/19/21 137/77   Wt Readings from Last 3 Encounters:  01/22/22 244 lb (110.7 kg)  09/07/21 248 lb 6.4 oz (112.7 kg)  06/19/21 251 lb (113.9 kg)     He was last seen for hypertension 4 months ago.  BP at that visit was 134/72. Management since that visit includes continuing same medication.  He reports good compliance with treatment. He is not having side effects.  He is following a Regular diet. He is not exercising. He does not smoke.  Use of agents associated with hypertension: NSAIDS.   Outside blood pressures are 120/60's. Symptoms: No chest pain No chest pressure  No palpitations No syncope  No dyspnea No orthopnea  No paroxysmal nocturnal dyspnea No lower extremity edema   Pertinent labs Lab Results  Component Value Date   NA 140 02/06/2021   K 4.5 02/06/2021   CREATININE 1.38 (H) 02/06/2021   EGFR 55 (L) 02/06/2021   GLUCOSE 141 (H) 02/06/2021   TSH 0.468 04/01/2016     The ASCVD Risk score (Arnett DK, et al., 2019) failed to calculate for the following reasons:   The patient has a prior MI or stroke diagnosis  ---------------------------------------------------------------------------------------------------   Medications: Outpatient Medications Prior to Visit  Medication Sig   amLODipine (NORVASC) 10 MG tablet Take 1 tablet (10 mg total) by mouth daily.   aspirin 325 MG EC tablet Take 1 tablet (325 mg total) by mouth daily.  atorvastatin (LIPITOR) 80 MG tablet Take 1 tablet (80 mg total) by mouth daily.   empagliflozin (JARDIANCE) 25 MG TABS tablet Take 1 tablet (25 mg total) by mouth daily.   glipiZIDE (GLUCOTROL XL) 5 MG 24 hr tablet TAKE 1 TABLET DAILY WITH   BREAKFAST   metoprolol (TOPROL-XL) 200 MG 24 hr tablet Take 1 tablet (200 mg total) by mouth daily.   nitroGLYCERIN (NITROSTAT) 0.4 MG SL tablet Place 1 tablet (0.4 mg total) under the tongue every 5 (five) minutes as needed for chest pain. 3-5 minutes x3 as nedded   SitaGLIPtin-MetFORMIN  HCl [phone] MG TB24 Take 1 tablet by mouth daily.   telmisartan (MICARDIS) 80 MG tablet Take 80 mg by mouth daily.   No facility-administered medications prior to visit.    Review of Systems  Constitutional:  Negative for appetite change, chills and fever.  Respiratory:  Negative for chest tightness, shortness of breath and wheezing.   Cardiovascular:  Negative for chest pain and palpitations.  Gastrointestinal:  Negative for abdominal pain, nausea and vomiting.       Objective    BP (!) 173/98 (BP Location: Right Arm, Patient Position: Sitting, Cuff Size: Large)   Pulse 60   Temp 97.7 F (36.5 C) (Oral)   Resp 16   Wt 244 lb (110.7 kg)   SpO2 99% Comment: room air  BMI 38.22 kg/m   Home BP today was 120/68. Crosschecked his home BP monitor with office monitor and readings were consistent.    Today's Vitals   01/22/22 0832 01/22/22 0845  BP: (!) 162/70 (!) 173/98  Pulse: 60   Resp: 16   Temp: 97.7 F (36.5 C)   TempSrc: Oral   SpO2: 99%   Weight: 244 lb (110.7 kg)    Body mass index is 38.22 kg/m.    Physical Exam  General appearance: Obese male, cooperative and in no acute distress Head: Normocephalic, without obvious abnormality, atraumatic Respiratory: Respirations even and unlabored, normal respiratory rate Extremities: All extremities are intact.  Skin: Skin color, texture, turgor normal. No rashes seen  Psych: Appropriate mood and affect. Neurologic: Mental status: Alert, oriented to person, place, and time, thought content appropriate.  Results for orders placed or performed in visit on 01/22/22  POCT HgB A1C  Result Value Ref Range   Hemoglobin A1C 7.9 (A) 4.0 - 5.6 %   Est. average glucose Bld gHb Est-mCnc 180     Assessment & Plan     1. Diabetes mellitus without complication (HCC) M2N is up. Continue current medications.  Is to work on improving diet.   2. Coronary artery disease involving native coronary artery of native heart without  angina pectoris Asymptomatic. Compliant with medication.  Continue aggressive risk factor modification.   - CBC - Comprehensive metabolic panel - Lipid panel  3. Essential hypertension Home blood pressures being checked daily and SBP consistently under 130. Checked his home meter against office meter today and readings were consistent. He is to let me know if home SBP consistently over 130  4. Morbid obesity (HCC)  - VITAMIN D 25 Hydroxy (Vit-D Deficiency, Fractures) - TSH  5. Prostate cancer screening  - PSA Total (Reflex To Free) ([facility] only)  6. Personal history of colonic polyps He has follow up scheduled with  [facility] in November  He declined recommended flu vaccine today. He anticipates getting it next week at work.   Future Appointments  Date Time Provider [place]  04/30/2022  8:20 AM  [name], MD [facility]        The entirety of the information documented in the History of Present Illness, Review of Systems and Physical Exam were personally obtained by me. Portions of this information were initially documented by the CMA and reviewed by me for thoroughness and accuracy.     [name], MD  [facility] [phone] (phone) [phone] (fax)  [place]

## 2022-01-22 ENCOUNTER — Encounter: Payer: Self-pay | Admitting: Family Medicine

## 2022-01-22 ENCOUNTER — Ambulatory Visit (INDEPENDENT_AMBULATORY_CARE_PROVIDER_SITE_OTHER): Payer: Medicare HMO | Admitting: Family Medicine

## 2022-01-22 VITALS — BP 120/68 | HR 60 | Temp 97.7°F | Resp 16 | Wt 244.0 lb

## 2022-01-22 DIAGNOSIS — Z860100 Personal history of colon polyps, unspecified: Secondary | ICD-10-CM

## 2022-01-22 DIAGNOSIS — E7800 Pure hypercholesterolemia, unspecified: Secondary | ICD-10-CM | POA: Diagnosis not present

## 2022-01-22 DIAGNOSIS — Z125 Encounter for screening for malignant neoplasm of prostate: Secondary | ICD-10-CM | POA: Diagnosis not present

## 2022-01-22 DIAGNOSIS — I2510 Atherosclerotic heart disease of native coronary artery without angina pectoris: Secondary | ICD-10-CM

## 2022-01-22 DIAGNOSIS — E1121 Type 2 diabetes mellitus with diabetic nephropathy: Secondary | ICD-10-CM | POA: Diagnosis not present

## 2022-01-22 DIAGNOSIS — I10 Essential (primary) hypertension: Secondary | ICD-10-CM

## 2022-01-22 DIAGNOSIS — E119 Type 2 diabetes mellitus without complications: Secondary | ICD-10-CM

## 2022-01-22 DIAGNOSIS — E559 Vitamin D deficiency, unspecified: Secondary | ICD-10-CM | POA: Diagnosis not present

## 2022-01-22 LAB — POCT GLYCOSYLATED HEMOGLOBIN (HGB A1C)
Est. average glucose Bld gHb Est-mCnc: 180
Hemoglobin A1C: 7.9 % — AB (ref 4.0–5.6)

## 2022-01-22 NOTE — Patient Instructions (Signed)
.   Please review the attached list of medications and notify my office if there are any errors.   . Please bring all of your medications to every appointment so we can make sure that our medication list is the same as yours.   

## 2022-01-23 LAB — CBC
Hematocrit: 46.3 % (ref 37.5–51.0)
Hemoglobin: 15.3 g/dL (ref 13.0–17.7)
MCH: 27.5 pg (ref 26.6–33.0)
MCHC: 33 g/dL (ref 31.5–35.7)
MCV: 83 fL (ref 79–97)
Platelets: 157 10*3/uL (ref 150–450)
RBC: 5.57 x10E6/uL (ref 4.14–5.80)
RDW: 14.4 % (ref 11.6–15.4)
WBC: 10.6 10*3/uL (ref 3.4–10.8)

## 2022-01-23 LAB — COMPREHENSIVE METABOLIC PANEL
ALT: 12 IU/L (ref 0–44)
AST: 12 IU/L (ref 0–40)
Albumin/Globulin Ratio: 1.1 — ABNORMAL LOW (ref 1.2–2.2)
Albumin: 4.1 g/dL (ref 3.8–4.8)
Alkaline Phosphatase: 137 IU/L — ABNORMAL HIGH (ref 44–121)
BUN/Creatinine Ratio: 11 (ref 10–24)
BUN: 11 mg/dL (ref 8–27)
Bilirubin Total: 0.6 mg/dL (ref 0.0–1.2)
CO2: 23 mmol/L (ref 20–29)
Calcium: 9.5 mg/dL (ref 8.6–10.2)
Chloride: 102 mmol/L (ref 96–106)
Creatinine, Ser: 1 mg/dL (ref 0.76–1.27)
Globulin, Total: 3.8 g/dL (ref 1.5–4.5)
Glucose: 123 mg/dL — ABNORMAL HIGH (ref 70–99)
Potassium: 3.4 mmol/L — ABNORMAL LOW (ref 3.5–5.2)
Sodium: 139 mmol/L (ref 134–144)
Total Protein: 7.9 g/dL (ref 6.0–8.5)
eGFR: 80 mL/min/{1.73_m2} (ref >59)

## 2022-01-23 LAB — TSH: TSH: 0.629 u[IU]/mL (ref 0.450–4.500)

## 2022-01-23 LAB — LIPID PANEL
Chol/HDL Ratio: 2.8 ratio (ref 0.0–5.0)
Cholesterol, Total: 122 mg/dL (ref 100–199)
HDL: 44 mg/dL (ref >39)
LDL Chol Calc (NIH): 65 mg/dL (ref 0–99)
Triglycerides: 60 mg/dL (ref 0–149)
VLDL Cholesterol Cal: 13 mg/dL (ref 5–40)

## 2022-01-23 LAB — PSA TOTAL (REFLEX TO FREE): Prostate Specific Ag, Serum: 1 ng/mL (ref 0.0–4.0)

## 2022-01-23 LAB — VITAMIN D 25 HYDROXY (VIT D DEFICIENCY, FRACTURES): Vit D, 25-Hydroxy: 22.5 ng/mL — ABNORMAL LOW (ref 30.0–100.0)

## 2022-01-24 ENCOUNTER — Encounter: Payer: Self-pay | Admitting: Family Medicine

## 2022-01-24 DIAGNOSIS — E559 Vitamin D deficiency, unspecified: Secondary | ICD-10-CM | POA: Insufficient documentation

## 2022-01-25 ENCOUNTER — Other Ambulatory Visit: Payer: Self-pay | Admitting: Family Medicine

## 2022-01-25 ENCOUNTER — Telehealth: Payer: Self-pay | Admitting: Family Medicine

## 2022-01-25 DIAGNOSIS — E7800 Pure hypercholesterolemia, unspecified: Secondary | ICD-10-CM

## 2022-01-25 DIAGNOSIS — I10 Essential (primary) hypertension: Secondary | ICD-10-CM

## 2022-01-25 MED ORDER — METOPROLOL SUCCINATE ER 200 MG PO TB24: 200.0000 mg | ORAL_TABLET | Freq: Every day | ORAL | 4 refills | Status: DC

## 2022-01-25 MED ORDER — ATORVASTATIN CALCIUM 80 MG PO TABS: 80.0000 mg | ORAL_TABLET | Freq: Every day | ORAL | 3 refills | Status: DC

## 2022-01-25 MED ORDER — AMLODIPINE BESYLATE 10 MG PO TABS: 10.0000 mg | ORAL_TABLET | Freq: Every day | ORAL | 4 refills | Status: DC

## 2022-01-25 NOTE — Telephone Encounter (Signed)
[facility] ref[facility] request for the following medications:  atorvastatin (LIPITOR) 80 MG tablet   Please advise.

## 2022-01-29 DIAGNOSIS — Z23 Encounter for immunization: Secondary | ICD-10-CM | POA: Diagnosis not present

## 2022-03-08 ENCOUNTER — Telehealth: Payer: Self-pay | Admitting: Family Medicine

## 2022-03-08 NOTE — Telephone Encounter (Signed)
He can have 4 weeks samples Jardiance 25 if we have them

## 2022-03-08 NOTE — Telephone Encounter (Signed)
Patient walked in requesting samples of Jardiance. Wants a call back before the end of the day if possible. Thank you, per patient

## 2022-03-09 DIAGNOSIS — Z86010 Personal history of colonic polyps: Secondary | ICD-10-CM | POA: Diagnosis not present

## 2022-03-09 NOTE — Telephone Encounter (Signed)
Samples given to pt 

## 2022-04-06 ENCOUNTER — Telehealth: Payer: Self-pay | Admitting: Family Medicine

## 2022-04-06 NOTE — Telephone Encounter (Signed)
Patient stopped by asking for samples for Jardiance 25 mg. Let him know when ready.

## 2022-04-06 NOTE — Telephone Encounter (Signed)
Please advise 

## 2022-04-06 NOTE — Telephone Encounter (Signed)
Patient advised sample are at front desk.

## 2022-04-06 NOTE — Telephone Encounter (Signed)
He can have 4 boxes if we have them

## 2022-04-30 ENCOUNTER — Encounter: Payer: Self-pay | Admitting: Family Medicine

## 2022-04-30 ENCOUNTER — Ambulatory Visit (INDEPENDENT_AMBULATORY_CARE_PROVIDER_SITE_OTHER): Payer: Medicare HMO | Admitting: Family Medicine

## 2022-04-30 VITALS — BP 137/71 | HR 72 | Ht 65.5 in | Wt 246.5 lb

## 2022-04-30 DIAGNOSIS — E559 Vitamin D deficiency, unspecified: Secondary | ICD-10-CM | POA: Diagnosis not present

## 2022-04-30 DIAGNOSIS — E1121 Type 2 diabetes mellitus with diabetic nephropathy: Secondary | ICD-10-CM | POA: Diagnosis not present

## 2022-04-30 NOTE — Progress Notes (Signed)
I,Sha'taria Tyson,acting as a Education administrator for [name], MD.,have documented all relevant documentation on the behalf of [name], MD,as directed by  [name], MD while in the presence of [name], MD.    Annual Wellness Visit     Patient: Todd Potter, Male    DOB: 05-17-1950, 72 y.o.   MRN: [id number] Visit Date: 04/30/2022  Today's Provider: [name], MD    Subjective     Is here today to follow up on vitamin d deficiency and diabetes. Has started OTC vitamin d supplement since last checked.  Lab Results  Component Value Date   VD25OH 22.5 (L) 01/22/2022   Continue on current diabetes medication without adverse effects.  Lab Results  Component Value Date   HGBA1C 7.9 (A) 01/22/2022   Taking medications consistently. No hypoglycemia. Due for eye exam which he plans on scheduling.   Medications: Outpatient Medications Prior to Visit  Medication Sig   amLODipine (NORVASC) 10 MG tablet Take 1 tablet (10 mg total) by mouth daily.   aspirin 325 MG EC tablet Take 1 tablet (325 mg total) by mouth daily.   atorvastatin (LIPITOR) 80 MG tablet Take 1 tablet (80 mg total) by mouth daily.   Cholecalciferol (VITAMIN D3) 50 MCG (2000 UT) CHEW Chew 2,000 Units by mouth daily.   empagliflozin (JARDIANCE) 25 MG TABS tablet Take 1 tablet (25 mg total) by mouth daily.   glipiZIDE (GLUCOTROL XL) 5 MG 24 hr tablet TAKE 1 TABLET DAILY WITH   BREAKFAST   metoprolol (TOPROL-XL) 200 MG 24 hr tablet Take 1 tablet (200 mg total) by mouth daily.   nitroGLYCERIN (NITROSTAT) 0.4 MG SL tablet Place 1 tablet (0.4 mg total) under the tongue every 5 (five) minutes as needed for chest pain. 3-5 minutes x3 as nedded   SitaGLIPtin-MetFORMIN HCl [phone] MG TB24 Take 1 tablet by mouth daily.   telmisartan (MICARDIS) 80 MG tablet Take 80 mg by mouth daily.   No facility-administered medications prior to visit.    No Known Allergies  Patient Care Team: [name], MD as PCP  - General (Family Medicine) Shirley Friar, MD (Cardiology) [facility]) [place], Raliegh Ip, NP as Nurse Practitioner (Cardiology) Garrel Ridgel, DPM as Consulting Physician (Podiatry)  Review of Systems  Constitutional:  Negative for appetite change, chills and fever.  Respiratory:  Negative for chest tightness, shortness of breath and wheezing.   Cardiovascular:  Negative for chest pain and palpitations.  Gastrointestinal:  Negative for abdominal pain, nausea and vomiting.        Objective    Vitals: BP 137/71 (BP Location: Right Arm, Patient Position: Sitting, Cuff Size: Large)   Pulse 72   Ht 5' 5.5" (1.664 m)   Wt 246 lb 8 oz (111.8 kg)   SpO2 97%   BMI 40.40 kg/m    Physical Exam  General: Appearance:    Severely obese male in no acute distress  Eyes:    PERRL, conjunctiva/corneas clear, EOM's intact       Lungs:     Clear to auscultation bilaterally, respirations unlabored  Heart:    Normal heart rate. Normal rhythm. No murmurs, rubs, or gallops.    MS:   All extremities are intact.    Neurologic:   Awake, alert, oriented x 3. No apparent focal neurological defect.          Assessment & Plan     1. Vitamin D deficiency Has started taking regular  d3 supplement.  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 Is planning on scheduling diabetic eye exam.  - Hemoglobin A1c    The entirety of the information documented in the History of Present Illness, Review of Systems and Physical Exam were personally obtained by me. Portions of this information were initially documented by the CMA and reviewed by me for thoroughness and accuracy.     [name], MD  [facility] [phone] (phone) [phone] (fax)  [place]

## 2022-05-01 LAB — HEMOGLOBIN A1C
Est. average glucose Bld gHb Est-mCnc: 220 mg/dL
Hgb A1c MFr Bld: 9.3 % — ABNORMAL HIGH (ref 4.8–5.6)

## 2022-05-01 LAB — VITAMIN D 25 HYDROXY (VIT D DEFICIENCY, FRACTURES): Vit D, 25-Hydroxy: 30.6 ng/mL (ref 30.0–100.0)

## 2022-05-06 DIAGNOSIS — E782 Mixed hyperlipidemia: Secondary | ICD-10-CM | POA: Diagnosis not present

## 2022-05-06 DIAGNOSIS — I429 Cardiomyopathy, unspecified: Secondary | ICD-10-CM | POA: Diagnosis not present

## 2022-05-06 DIAGNOSIS — E1121 Type 2 diabetes mellitus with diabetic nephropathy: Secondary | ICD-10-CM | POA: Diagnosis not present

## 2022-05-06 DIAGNOSIS — I2510 Atherosclerotic heart disease of native coronary artery without angina pectoris: Secondary | ICD-10-CM | POA: Diagnosis not present

## 2022-05-06 DIAGNOSIS — Z79899 Other long term (current) drug therapy: Secondary | ICD-10-CM | POA: Diagnosis not present

## 2022-05-17 ENCOUNTER — Telehealth: Payer: Self-pay

## 2022-05-17 NOTE — Telephone Encounter (Signed)
Copied from [place] [id number]. Topic: General - Other >> May 17, 2022 11:38 AM Sabas Sous wrote: Reason for CRM: Pt called reporting that his cardiologist wants him to have lab work. She has started him on spironolactone 25 MG tablets and wants him to contact his PCP for follow up lab work in two weeks.   Best contact: [phone]  He says he does not know exactly what kind of lab work he is supposed to get.

## 2022-05-18 ENCOUNTER — Other Ambulatory Visit: Payer: Self-pay

## 2022-05-18 DIAGNOSIS — I10 Essential (primary) hypertension: Secondary | ICD-10-CM

## 2022-05-18 NOTE — Telephone Encounter (Signed)
Copied from [place] [id number]. Topic: General - Other >> May 17, 2022 11:38 AM Sabas Sous wrote: Reason for CRM: Pt called reporting that his cardiologist wants him to have lab work. She has started him on spironolactone 25 MG tablets and wants him to contact his PCP for follow up lab work in two weeks.   Best contact: [phone]  He says he does not know exactly what kind of lab work he is supposed to get.

## 2022-05-19 LAB — RENAL FUNCTION PANEL
Albumin: 3.9 g/dL (ref 3.8–4.8)
BUN/Creatinine Ratio: 12 (ref 10–24)
BUN: 16 mg/dL (ref 8–27)
CO2: 20 mmol/L (ref 20–29)
Calcium: 9.5 mg/dL (ref 8.6–10.2)
Chloride: 99 mmol/L (ref 96–106)
Creatinine, Ser: 1.31 mg/dL — ABNORMAL HIGH (ref 0.76–1.27)
Glucose: 215 mg/dL — ABNORMAL HIGH (ref 70–99)
Phosphorus: 3.8 mg/dL (ref 2.8–4.1)
Potassium: 3.9 mmol/L (ref 3.5–5.2)
Sodium: 135 mmol/L (ref 134–144)
eGFR: 57 mL/min/{1.73_m2} — ABNORMAL LOW (ref >59)

## 2022-05-25 ENCOUNTER — Other Ambulatory Visit: Payer: Self-pay

## 2022-05-25 ENCOUNTER — Telehealth: Payer: Self-pay

## 2022-05-25 DIAGNOSIS — E1121 Type 2 diabetes mellitus with diabetic nephropathy: Secondary | ICD-10-CM

## 2022-05-25 DIAGNOSIS — I10 Essential (primary) hypertension: Secondary | ICD-10-CM

## 2022-05-25 NOTE — Telephone Encounter (Signed)
Patient had labs on 05/18/22.  He states you wanted more labs.  Can you place order or let me know what you want and I will  Thx

## 2022-05-26 LAB — RENAL FUNCTION PANEL
Albumin: 4.1 g/dL (ref 3.8–4.8)
BUN/Creatinine Ratio: 7 — ABNORMAL LOW (ref 10–24)
BUN: 9 mg/dL (ref 8–27)
CO2: 24 mmol/L (ref 20–29)
Calcium: 9.4 mg/dL (ref 8.6–10.2)
Chloride: 102 mmol/L (ref 96–106)
Creatinine, Ser: 1.35 mg/dL — ABNORMAL HIGH (ref 0.76–1.27)
Glucose: 148 mg/dL — ABNORMAL HIGH (ref 70–99)
Phosphorus: 3.1 mg/dL (ref 2.8–4.1)
Potassium: 4.3 mmol/L (ref 3.5–5.2)
Sodium: 138 mmol/L (ref 134–144)
eGFR: 55 mL/min/{1.73_m2} — ABNORMAL LOW (ref >59)

## 2022-06-10 ENCOUNTER — Encounter: Payer: Self-pay | Admitting: *Deleted

## 2022-06-11 ENCOUNTER — Encounter
Admission: RE | Disposition: A | Discharge status: Home / Self Care | Payer: Self-pay | Source: Home / Self Care | Attending: Gastroenterology

## 2022-06-11 ENCOUNTER — Ambulatory Visit: Payer: Medicare HMO | Admitting: Anesthesiology

## 2022-06-11 ENCOUNTER — Ambulatory Visit
Admission: RE | Admit: 2022-06-11 | Discharge: 2022-06-11 | Disposition: A | Discharge status: Home / Self Care | Payer: Medicare HMO | Attending: Gastroenterology | Admitting: Gastroenterology

## 2022-06-11 DIAGNOSIS — K621 Rectal polyp: Secondary | ICD-10-CM | POA: Diagnosis not present

## 2022-06-11 DIAGNOSIS — Z7984 Long term (current) use of oral hypoglycemic drugs: Secondary | ICD-10-CM | POA: Insufficient documentation

## 2022-06-11 DIAGNOSIS — I252 Old myocardial infarction: Secondary | ICD-10-CM | POA: Insufficient documentation

## 2022-06-11 DIAGNOSIS — K640 First degree hemorrhoids: Secondary | ICD-10-CM | POA: Insufficient documentation

## 2022-06-11 DIAGNOSIS — Z1211 Encounter for screening for malignant neoplasm of colon: Secondary | ICD-10-CM | POA: Insufficient documentation

## 2022-06-11 DIAGNOSIS — Z8673 Personal history of transient ischemic attack (TIA), and cerebral infarction without residual deficits: Secondary | ICD-10-CM | POA: Insufficient documentation

## 2022-06-11 DIAGNOSIS — Z09 Encounter for follow-up examination after completed treatment for conditions other than malignant neoplasm: Secondary | ICD-10-CM | POA: Diagnosis not present

## 2022-06-11 DIAGNOSIS — Z87891 Personal history of nicotine dependence: Secondary | ICD-10-CM | POA: Insufficient documentation

## 2022-06-11 DIAGNOSIS — I10 Essential (primary) hypertension: Secondary | ICD-10-CM | POA: Insufficient documentation

## 2022-06-11 DIAGNOSIS — Z86010 Personal history of colonic polyps: Secondary | ICD-10-CM | POA: Diagnosis not present

## 2022-06-11 DIAGNOSIS — K635 Polyp of colon: Secondary | ICD-10-CM | POA: Diagnosis not present

## 2022-06-11 DIAGNOSIS — K648 Other hemorrhoids: Secondary | ICD-10-CM | POA: Insufficient documentation

## 2022-06-11 DIAGNOSIS — I2510 Atherosclerotic heart disease of native coronary artery without angina pectoris: Secondary | ICD-10-CM | POA: Diagnosis not present

## 2022-06-11 DIAGNOSIS — D124 Benign neoplasm of descending colon: Secondary | ICD-10-CM | POA: Diagnosis not present

## 2022-06-11 DIAGNOSIS — D128 Benign neoplasm of rectum: Secondary | ICD-10-CM | POA: Insufficient documentation

## 2022-06-11 DIAGNOSIS — E119 Type 2 diabetes mellitus without complications: Secondary | ICD-10-CM | POA: Insufficient documentation

## 2022-06-11 HISTORY — DX: Atherosclerotic heart disease of native coronary artery without angina pectoris: I25.10

## 2022-06-11 HISTORY — PX: COLONOSCOPY WITH PROPOFOL: SHX5780

## 2022-06-11 LAB — GLUCOSE, CAPILLARY: Glucose-Capillary: 144 mg/dL — ABNORMAL HIGH (ref 70–99)

## 2022-06-11 SURGERY — COLONOSCOPY WITH PROPOFOL
Anesthesia: General

## 2022-06-11 MED ORDER — LIDOCAINE HCL (CARDIAC) PF 100 MG/5ML IV SOSY
PREFILLED_SYRINGE | INTRAVENOUS | Status: DC | PRN
  Administered 2022-06-11: 100 mg via INTRAVENOUS

## 2022-06-11 MED ORDER — PHENYLEPHRINE 80 MCG/ML (10ML) SYRINGE FOR IV PUSH (FOR BLOOD PRESSURE SUPPORT)
PREFILLED_SYRINGE | INTRAVENOUS | Status: DC | PRN
  Administered 2022-06-11: 160 ug via INTRAVENOUS

## 2022-06-11 MED ORDER — PROPOFOL 500 MG/50ML IV EMUL
INTRAVENOUS | Status: DC | PRN
  Administered 2022-06-11: 165 ug/kg/min via INTRAVENOUS

## 2022-06-11 MED ORDER — SODIUM CHLORIDE 0.9 % IV SOLN: INTRAVENOUS | Status: DC

## 2022-06-11 MED ORDER — PROPOFOL 10 MG/ML IV BOLUS
INTRAVENOUS | Status: DC | PRN
  Administered 2022-06-11: 30 mg via INTRAVENOUS
  Administered 2022-06-11: 20 mg via INTRAVENOUS
  Administered 2022-06-11: 10 mg via INTRAVENOUS
  Administered 2022-06-11: 70 mg via INTRAVENOUS

## 2022-06-11 MED ORDER — EPHEDRINE SULFATE (PRESSORS) 50 MG/ML IJ SOLN: INTRAMUSCULAR | Status: DC | PRN

## 2022-06-11 NOTE — Transfer of Care (Signed)
Immediate Anesthesia Transfer of Care Note  Patient: Todd Potter  Procedure(s) Performed: COLONOSCOPY WITH PROPOFOL  Patient Location: Endoscopy Unit  Anesthesia Type:General  Level of Consciousness: drowsy and patient cooperative  Airway & Oxygen Therapy: Patient Spontanous Breathing and Patient connected to face mask oxygen  Post-op Assessment: Report given to RN and Post -op Vital signs reviewed and stable  Post vital signs: Reviewed and stable  Last Vitals:  Vitals Value Taken Time  BP    Temp    Pulse    Resp    SpO2      Last Pain:  Vitals:   06/11/22 1207  TempSrc: Temporal         Complications: No notable events documented.

## 2022-06-11 NOTE — Anesthesia Preprocedure Evaluation (Addendum)
Anesthesia Evaluation  Patient identified by MRN, date of birth, ID band Patient awake    Reviewed: Allergy & Precautions, NPO status , Patient's Chart, lab work & pertinent test results  Airway Mallampati: III  TM Distance: >3 FB Neck ROM: full    Dental  (+) Edentulous Upper, Edentulous Lower   Pulmonary former smoker   Pulmonary exam normal        Cardiovascular hypertension, + CAD and + Past MI  Normal cardiovascular exam  2020 ECHO IMPRESSIONS     1. The left ventricle has normal systolic function with an ejection  fraction of 60-65%. The cavity size was mildly dilated. There is moderate  concentric left ventricular hypertrophy. Left ventricular diastolic  Doppler parameters are indeterminate. No  evidence of left ventricular regional wall motion abnormalities.   2. The right ventricle has normal systolic function. The cavity was  normal. There is no increase in right ventricular wall thickness. Right  ventricular systolic pressure could not be assessed.   3. No evidence of mitral valve stenosis.   4. The aortic valve is tricuspid. Mild calcification of the aortic valve.  No stenosis of the aortic valve.   5. The aorta is normal in size and structure.   6. The aortic root and aortic arch are normal in size and structure.   7. No intracardiac thrombi or masses were visualized.      Neuro/Psych CVA  negative psych ROS   GI/Hepatic negative GI ROS, Neg liver ROS,neg GERD  ,,  Endo/Other  diabetes    Renal/GU Renal disease  negative genitourinary   Musculoskeletal  (+) Arthritis ,    Abdominal   Peds  Hematology negative hematology ROS (+)   Anesthesia Other Findings Past Medical History: No date: Acanthosis nigricans No date: Cardiomegaly No date: Coronary artery disease No date: Diabetes mellitus without complication (HCC) No date: History of adenomatous polyp of colon No date: Knee pain No date:  Myocardial infarct ([place]) 11/17/2018: Stroke ([place])     Comment:  right ventral medial medulla  Past Surgical History: No date: CARDIAC CATHETERIZATION     Comment:  Stent Placement x 5 to RCA  08/06/2016: COLONOSCOPY WITH PROPOFOL; N/A     Comment:  Procedure: COLONOSCOPY WITH PROPOFOL;  Surgeon: Manya Silvas, MD;  Location: [facility] ENDOSCOPY;  Service:               Endoscopy;  Laterality: N/A; 1970&1990: CYSTECTOMY     Comment:  x2 on tail bone     Reproductive/Obstetrics negative OB ROS                             Anesthesia Physical Anesthesia Plan  ASA: 3  Anesthesia Plan: General   Post-op Pain Management:    Induction: Intravenous  PONV Risk Score and Plan: Propofol infusion and TIVA  Airway Management Planned: Natural Airway and Nasal Cannula  Additional Equipment:   Intra-op Plan:   Post-operative Plan:   Informed Consent: I have reviewed the patients History and Physical, chart, labs and discussed the procedure including the risks, benefits and alternatives for the proposed anesthesia with the patient or authorized representative who has indicated his/her understanding and acceptance.     Dental Advisory Given  Plan Discussed with: Anesthesiologist, CRNA and Surgeon  Anesthesia Plan Comments: (Patient consented for risks of anesthesia including but not limited to:  -  adverse reactions to medications - risk of airway placement if required - damage to eyes, teeth, lips or other oral mucosa - nerve damage due to positioning  - sore throat or hoarseness - Damage to heart, brain, nerves, lungs, other parts of body or loss of life  Patient voiced understanding.)       Anesthesia Quick Evaluation

## 2022-06-11 NOTE — H&P (Signed)
Outpatient short stay form Pre-procedure 06/11/2022  Lesly Rubenstein, MD  Primary Physician: [name], MD  Reason for visit:  Surveillance colonoscopy  History of present illness:    72 y/o gentleman with history of obesity, hypertension, and DM II here for surveillance colonoscopy. Last colonoscopy in 2018 with small TA. No blood thinners. No family history of GI malignancies. No significant abdominal surgeries.    Current Facility-Administered Medications:    0.9 %  sodium chloride infusion, , Intravenous, Continuous, Jazsmine Macari, Hilton Cork, MD, Last Rate: 20 mL/hr at 06/11/22 1228, New Bag at 06/11/22 1228  Medications Prior to Admission  Medication Sig Dispense Refill Last Dose   amLODipine (NORVASC) 10 MG tablet Take 1 tablet (10 mg total) by mouth daily. 90 tablet 4 06/10/2022   aspirin EC 81 MG tablet Take 81 mg by mouth daily. Swallow whole.   06/10/2022   atorvastatin (LIPITOR) 80 MG tablet Take 1 tablet (80 mg total) by mouth daily. 90 tablet 3 06/10/2022   Cholecalciferol (VITAMIN D3) 50 MCG (2000 UT) CHEW Chew 2,000 Units by mouth daily.   06/10/2022   empagliflozin (JARDIANCE) 25 MG TABS tablet Take 1 tablet (25 mg total) by mouth daily. 28 tablet 0 06/10/2022   glipiZIDE (GLUCOTROL XL) 5 MG 24 hr tablet TAKE 1 TABLET DAILY WITH   BREAKFAST 90 tablet 4 06/10/2022   metoprolol (TOPROL-XL) 200 MG 24 hr tablet Take 1 tablet (200 mg total) by mouth daily. 90 tablet 4 06/10/2022   SitaGLIPtin-MetFORMIN HCl [phone] MG TB24 Take 1 tablet by mouth daily. 30 tablet 5 06/10/2022   spironolactone (ALDACTONE) 25 MG tablet Take 25 mg by mouth daily.   06/10/2022   telmisartan (MICARDIS) 80 MG tablet Take 80 mg by mouth daily.   06/10/2022   aspirin 325 MG EC tablet Take 1 tablet (325 mg total) by mouth daily. (Patient not taking: Reported on 06/11/2022) 90 tablet 4 Not Taking   nitroGLYCERIN (NITROSTAT) 0.4 MG SL tablet Place 1 tablet (0.4 mg total) under the tongue every 5 (five) minutes  as needed for chest pain. 3-5 minutes x3 as nedded 25 tablet 0      No Known Allergies   Past Medical History:  Diagnosis Date   Acanthosis nigricans    Cardiomegaly    Coronary artery disease    Diabetes mellitus without complication (HCC)    History of adenomatous polyp of colon    Knee pain    Myocardial infarct ([place])    Stroke ([place]) 11/17/2018   right ventral medial medulla    Review of systems:  Otherwise negative.    Physical Exam  Gen: Alert, oriented. Appears stated age.  HEENT: PERRLA. Lungs: No respiratory distress CV: RRR Abd: soft, benign, no masses Ext: No edema    Planned procedures: Proceed with colonoscopy. The patient understands the nature of the planned procedure, indications, risks, alternatives and potential complications including but not limited to bleeding, infection, perforation, damage to internal organs and possible oversedation/side effects from anesthesia. The patient agrees and gives consent to proceed.  Please refer to procedure notes for findings, recommendations and patient disposition/instructions.     Lesly Rubenstein, MD [facility]astroenter[facility]

## 2022-06-11 NOTE — Interval H&P Note (Signed)
History and Physical Interval Note:  06/11/2022 12:35 PM  Todd Potter  has presented today for surgery, with the diagnosis of h/o TA polyps.  The various methods of treatment have been discussed with the patient and family. After consideration of risks, benefits and other options for treatment, the patient has consented to  Procedure(s): COLONOSCOPY WITH PROPOFOL (N/A) as a surgical intervention.  The patient's history has been reviewed, patient examined, no change in status, stable for surgery.  I have reviewed the patient's chart and labs.  Questions were answered to the patient's satisfaction.     Lesly Rubenstein  Ok to proceed with colonoscopy

## 2022-06-11 NOTE — Op Note (Signed)
[facility] Gastroenterology Patient Name: Todd Potter Procedure Date: 06/11/2022 12:29 PM MRN: [id number] Account #: [id number] Date of Birth: [birth year] Admit Type: Outpatient Age: 72 Room: [facility] ENDO ROOM 3 Gender: Male Note Status: Finalized Instrument Name: Jasper Riling [id number] Procedure:             Colonoscopy Indications:           Surveillance: Personal history of adenomatous polyps                         on last colonoscopy > 5 years ago Providers:             Andrey Farmer MD, MD Referring MD:          Kirstie Peri. Caryn Section, MD (Referring MD) Medicines:             Monitored Anesthesia Care Complications:         No immediate complications. Estimated blood loss:                         Minimal. Procedure:             Pre-Anesthesia Assessment:                        - Prior to the procedure, a History and Physical was                         performed, and patient medications and allergies were                         reviewed. The patient is competent. The risks and                         benefits of the procedure and the sedation options and                         risks were discussed with the patient. All questions                         were answered and informed consent was obtained.                         Patient identification and proposed procedure were                         verified by the physician, the nurse, the                         anesthesiologist, the anesthetist and the technician                         in the endoscopy suite. Mental Status Examination:                         alert and oriented. Airway Examination: normal                         oropharyngeal airway and neck mobility. Respiratory  Examination: clear to auscultation. CV Examination:                         normal. Prophylactic Antibiotics: The patient does not                         require prophylactic antibiotics. Prior                          Anticoagulants: The patient has taken no anticoagulant                         or antiplatelet agents. ASA Grade Assessment: III - A                         patient with severe systemic disease. After reviewing                         the risks and benefits, the patient was deemed in                         satisfactory condition to undergo the procedure. The                         anesthesia plan was to use monitored anesthesia care                         (MAC). Immediately prior to administration of                         medications, the patient was re-assessed for adequacy                         to receive sedatives. The heart rate, respiratory                         rate, oxygen saturations, blood pressure, adequacy of                         pulmonary ventilation, and response to care were                         monitored throughout the procedure. The physical                         status of the patient was re-assessed after the                         procedure.                        After obtaining informed consent, the colonoscope was                         passed under direct vision. Throughout the procedure,                         the patient's blood pressure, pulse, and oxygen  saturations were monitored continuously. The                         Colonoscope was introduced through the anus and                         advanced to the the cecum, identified by appendiceal                         orifice and ileocecal valve. The colonoscopy was                         performed without difficulty. The patient tolerated                         the procedure well. The quality of the bowel                         preparation was good. The ileocecal valve, appendiceal                         orifice, and rectum were photographed. Findings:      The perianal and digital rectal examinations were normal.      A 2 mm polyp was found in the descending  colon. The polyp was sessile.       The polyp was removed with a jumbo cold forceps. Resection and retrieval       were complete. Estimated blood loss was minimal.      A 3 mm polyp was found in the rectum. The polyp was sessile. The polyp       was removed with a cold snare. Resection and retrieval were complete.       Estimated blood loss was minimal.      Internal hemorrhoids were found during retroflexion. The hemorrhoids       were Grade I (internal hemorrhoids that do not prolapse).      The exam was otherwise without abnormality on direct and retroflexion       views. Impression:            - One 2 mm polyp in the descending colon, removed with                         a jumbo cold forceps. Resected and retrieved.                        - One 3 mm polyp in the rectum, removed with a cold                         snare. Resected and retrieved.                        - Internal hemorrhoids.                        - The examination was otherwise normal on direct and                         retroflexion views. Recommendation:        - Discharge patient to home.                        -  Resume previous diet.                        - Continue present medications.                        - Await pathology results.                        - Repeat colonoscopy is not recommended due to current                         age (80 years or older) for surveillance.                        - Return to referring physician as previously                         scheduled. Procedure Code(s):     --- Professional ---                        [phone], Colonoscopy, flexible; with removal of                         tumor(s), polyp(s), or other lesion(s) by snare                         technique                        45380, 75, Colonoscopy, flexible; with biopsy, single                         or multiple Diagnosis Code(s):     --- Professional ---                        Z86.010, Personal history of colonic  polyps                        D12.4, Benign neoplasm of descending colon                        D12.8, Benign neoplasm of rectum                        K64.0, First degree hemorrhoids CPT copyright 2022 American Medical Association. All rights reserved. The codes documented in this report are preliminary and upon coder review may  be revised to meet current compliance requirements. Andrey Farmer MD, MD 06/11/2022 1:04:31 PM Number of Addenda: 0 Note Initiated On: 06/11/2022 12:29 PM Scope Withdrawal Time: 0 hours 11 minutes 0 seconds  Total Procedure Duration: 0 hours 14 minutes 4 seconds  Estimated Blood Loss:  Estimated blood loss was minimal.      [facility]

## 2022-06-14 ENCOUNTER — Encounter: Payer: Self-pay | Admitting: Gastroenterology

## 2022-06-14 LAB — SURGICAL PATHOLOGY

## 2022-06-22 ENCOUNTER — Other Ambulatory Visit: Payer: Self-pay | Admitting: Family Medicine

## 2022-06-22 DIAGNOSIS — E1121 Type 2 diabetes mellitus with diabetic nephropathy: Secondary | ICD-10-CM

## 2022-06-22 NOTE — Anesthesia Postprocedure Evaluation (Signed)
Anesthesia Post Note  Patient: Todd Potter  Procedure(s) Performed: COLONOSCOPY WITH PROPOFOL  Patient location during evaluation: Endoscopy Anesthesia Type: General Level of consciousness: awake and alert Pain management: pain level controlled Vital Signs Assessment: post-procedure vital signs reviewed and stable Respiratory status: spontaneous breathing, nonlabored ventilation and respiratory function stable Cardiovascular status: blood pressure returned to baseline and stable Postop Assessment: no apparent nausea or vomiting Anesthetic complications: no   No notable events documented.   Last Vitals:  Vitals:   06/11/22 1320 06/11/22 1330  BP: 122/78 126/76  Pulse:    Resp:    Temp:    SpO2:      Last Pain:  Vitals:   06/12/22 1401  TempSrc:   PainSc: 0-No pain                 Alphonsus Sias

## 2022-06-22 NOTE — Telephone Encounter (Signed)
Requested Prescriptions  Pending Prescriptions Disp Refills   JANUMET XR [phone] MG TB24 [Pharmacy Med Name: JANUMET XR 100-1,000 MG TABLET] 90 tablet 1    Sig: TAKE 1 TABLET BY MOUTH EVERY DAY     Endocrinology:  Diabetes - Biguanide + DPP-4 Inhibitor Combos Failed - 06/22/2022  1:37 AM      Failed - HBA1C is between 0 and 7.9 and within 180 days    Hgb A1c MFr Bld  Date Value Ref Range Status  04/30/2022 9.3 (H) 4.8 - 5.6 % Final    Comment:             Prediabetes: 5.7 - 6.4          Diabetes: >6.4          Glycemic control for adults with diabetes: <7.0          Failed - Cr in normal range and within 360 days    Creatinine, Ser  Date Value Ref Range Status  05/25/2022 1.35 (H) 0.76 - 1.27 mg/dL Final   Creatinine, POC  Date Value Ref Range Status  12/03/2016 na mg/dL Final   Creatinine, Urine  Date Value Ref Range Status  01/09/2022 3.0 g/L  Final         Failed - eGFR in normal range and within 360 days    GFR calc Af Amer  Date Value Ref Range Status  01/07/2020 84 >59 mL/min/1.73 Final    Comment:    **[facility] currently reports eGFR in compliance with the current**   recommendations of the [employer]. [facility] will   update reporting as new guidelines are published from the NKF-ASN   Task force.    GFR calc non Af Amer  Date Value Ref Range Status  01/07/2020 72 >59 mL/min/1.73 Final   eGFR  Date Value Ref Range Status  05/25/2022 55 (L) >59 mL/min/1.73 Final         Failed - B12 Level in normal range and within 720 days    No results found for: "VITAMINB12"       Failed - CBC within normal limits and completed in the last 12 months    WBC  Date Value Ref Range Status  01/22/2022 10.6 3.4 - 10.8 x10E3/uL Final  11/17/2018 11.1 (H) 4.0 - 10.5 K/uL Final   RBC  Date Value Ref Range Status  01/22/2022 5.57 4.14 - 5.80 x10E6/uL Final  11/17/2018 5.25 4.22 - 5.81 MIL/uL Final   Hemoglobin  Date Value Ref Range Status  01/22/2022 15.3  13.0 - 17.7 g/dL Final   Hematocrit  Date Value Ref Range Status  01/22/2022 46.3 37.5 - 51.0 % Final   MCHC  Date Value Ref Range Status  01/22/2022 33.0 31.5 - 35.7 g/dL Final  11/17/2018 33.6 30.0 - 36.0 g/dL Final   [facility]  Date Value Ref Range Status  01/22/2022 27.5 26.6 - 33.0 pg Final  11/17/2018 29.0 26.0 - 34.0 pg Final   MCV  Date Value Ref Range Status  01/22/2022 83 79 - 97 fL Final   No results found for: "PLTCOUNTKUC", "LABPLAT", "POCPLA" RDW  Date Value Ref Range Status  01/22/2022 14.4 11.6 - 15.4 % Final         Passed - Valid encounter within last 6 months    Recent Outpatient Visits           1 month ago Vitamin D deficiency   [place], Donald E, MD  5 months ago Diabetes mellitus without complication [facility])   [place] [name], MD   9 months ago Controlled type 2 diabetes mellitus with diabetic nephropathy, without long-term current use of insulin ([place])   [place], Donald E, MD   1 year ago Controlled type 2 diabetes mellitus with diabetic nephropathy, without long-term current use of insulin ([place])   [place], Donald E, MD   1 year ago Diabetes mellitus due to underlying condition, controlled, with diabetic nephropathy, without long-term current use of insulin ([place])   [place], Donald E, MD       Future Appointments             In 1 month Fisher, Kirstie Peri, MD [facility]

## 2022-07-21 DIAGNOSIS — E782 Mixed hyperlipidemia: Secondary | ICD-10-CM | POA: Diagnosis not present

## 2022-07-21 DIAGNOSIS — E1140 Type 2 diabetes mellitus with diabetic neuropathy, unspecified: Secondary | ICD-10-CM | POA: Diagnosis not present

## 2022-07-21 DIAGNOSIS — I10 Essential (primary) hypertension: Secondary | ICD-10-CM | POA: Diagnosis not present

## 2022-07-21 DIAGNOSIS — I2510 Atherosclerotic heart disease of native coronary artery without angina pectoris: Secondary | ICD-10-CM | POA: Diagnosis not present

## 2022-08-06 ENCOUNTER — Ambulatory Visit: Payer: Medicare HMO | Admitting: Family Medicine

## 2022-08-11 ENCOUNTER — Telehealth: Payer: Self-pay | Admitting: Family Medicine

## 2022-08-11 NOTE — Telephone Encounter (Signed)
He can have 4 sample boxes

## 2022-08-11 NOTE — Telephone Encounter (Signed)
Patient stopped in to see if he could get samples of Jardiance 25 mg.  Let patient know as he is out.

## 2022-08-11 NOTE — Telephone Encounter (Signed)
Patient advised.

## 2022-08-13 ENCOUNTER — Encounter: Payer: Self-pay | Admitting: Family Medicine

## 2022-08-13 ENCOUNTER — Ambulatory Visit (INDEPENDENT_AMBULATORY_CARE_PROVIDER_SITE_OTHER): Payer: Medicare HMO | Admitting: Family Medicine

## 2022-08-13 VITALS — BP 140/76 | HR 86 | Ht 66.5 in | Wt 253.0 lb

## 2022-08-13 DIAGNOSIS — E1121 Type 2 diabetes mellitus with diabetic nephropathy: Secondary | ICD-10-CM

## 2022-08-13 DIAGNOSIS — I10 Essential (primary) hypertension: Secondary | ICD-10-CM | POA: Diagnosis not present

## 2022-08-13 DIAGNOSIS — I2510 Atherosclerotic heart disease of native coronary artery without angina pectoris: Secondary | ICD-10-CM

## 2022-08-13 LAB — POCT GLYCOSYLATED HEMOGLOBIN (HGB A1C)
Est. average glucose Bld gHb Est-mCnc: 180
Hemoglobin A1C: 7.9 % — AB (ref 4.0–5.6)

## 2022-08-13 MED ORDER — NITROGLYCERIN 0.4 MG SL SUBL: 0.4000 mg | SUBLINGUAL_TABLET | SUBLINGUAL | 0 refills | Status: AC | PRN

## 2022-08-13 NOTE — Patient Instructions (Signed)
.   Please review the attached list of medications and notify my office if there are any errors.   . Please bring all of your medications to every appointment so we can make sure that our medication list is the same as yours.   

## 2022-08-13 NOTE — Progress Notes (Signed)
Established patient visit   Patient: Todd Potter   DOB: 1951/03/16   72 y.o. Male  MRN: [id number] Visit Date: 08/13/2022  Today's healthcare provider: Mila Merry, MD   Chief Complaint  Patient presents with   Diabetes   Hypertension   Hyperlipidemia   Subjective    HPI  Here today to follow up on hypertension and diabetes. Feels well no complaints. Home systolic BP usually in the 110s-120s. States his cardiologist Dr. Theophilus Bones prescribed Ozempic and changed Janumet to metformin ER 500mg , two tablet daily. However he has a large supply of Janumet and has not yet made medication changes.   Medications: Outpatient Medications Prior to Visit  Medication Sig   amLODipine (NORVASC) 10 MG tablet Take 1 tablet (10 mg total) by mouth daily.   aspirin EC 81 MG tablet Take 81 mg by mouth daily. Swallow whole.   atorvastatin (LIPITOR) 80 MG tablet Take 1 tablet (80 mg total) by mouth daily.   Cholecalciferol (VITAMIN D3) 50 MCG (2000 UT) CHEW Chew 2,000 Units by mouth daily.   empagliflozin (JARDIANCE) 25 MG TABS tablet Take 1 tablet (25 mg total) by mouth daily.   glipiZIDE (GLUCOTROL XL) 5 MG 24 hr tablet TAKE 1 TABLET DAILY WITH   BREAKFAST   metoprolol (TOPROL-XL) 200 MG 24 hr tablet Take 1 tablet (200 mg total) by mouth daily.   SitaGLIPtin-MetFORMIN HCl (JANUMET XR) [phone] MG TB24 TAKE 1 TABLET BY MOUTH EVERY DAY   spironolactone (ALDACTONE) 25 MG tablet Take 25 mg by mouth daily.   telmisartan (MICARDIS) 80 MG tablet Take 80 mg by mouth daily.   [DISCONTINUED] nitroGLYCERIN (NITROSTAT) 0.4 MG SL tablet Place 1 tablet (0.4 mg total) under the tongue every 5 (five) minutes as needed for chest pain. 3-5 minutes x3 as nedded   [DISCONTINUED] aspirin 325 MG EC tablet Take 1 tablet (325 mg total) by mouth daily. (Patient not taking: Reported on 06/11/2022)   No facility-administered medications prior to visit.    Review of Systems     Objective    BP (!) 140/76   Pulse  86   Ht 5' 6.5" (1.689 m)   Wt 253 lb (114.8 kg)   SpO2 96%   BMI 40.22 kg/m    Physical Exam    General: Appearance:    Severely obese male in no acute distress  Eyes:    PERRL, conjunctiva/corneas clear, EOM's intact       Lungs:     Clear to auscultation bilaterally, respirations unlabored  Heart:    Normal heart rate. Normal rhythm. No murmurs, rubs, or gallops.    MS:   All extremities are intact.    Neurologic:   Awake, alert, oriented x 3. No apparent focal neurological defect.         Results for orders placed or performed in visit on 08/13/22  POCT HgB A1C  Result Value Ref Range   Hemoglobin A1C 7.9 (A) 4.0 - 5.6 %   Est. average glucose Bld gHb Est-mCnc 180     Assessment & Plan     1. Diabetes mellitus with nephropathy (HCC) Doing better with dietary improvements. Expect it to continue to improve after he starts Ozempic prescribed be his cardiologist.   2. Morbid obesity (HCC) Expect some weight loss once he start Ozempic. Advised to call if any adverse effects of medications.   3. Essential hypertension Doing well current medications. Reports home BP readings that are significantly better than  today's office reading.   4. Coronary artery disease involving native coronary artery of native heart without angina pectoris Asymptomatic. Compliant with medication.  Continue aggressive risk factor modification.  Continue regular Dr. Tawanna Sat  refill - nitroGLYCERIN (NITROSTAT) 0.4 MG SL tablet; Place 1 tablet (0.4 mg total) under the tongue every 5 (five) minutes as needed for chest pain. 3-5 minutes x3 as nedded  Dispense: 25 tablet; Refill: 0   Future Appointments  Date Time Provider Department Center  12/17/2022 11:00 AM Sherrie Mustache, Demetrios Isaacs, MD [facility]            Mila Merry, MD  [facility] [phone] (phone) [phone] (fax)  [facility]

## 2022-09-06 DIAGNOSIS — E113293 Type 2 diabetes mellitus with mild nonproliferative diabetic retinopathy without macular edema, bilateral: Secondary | ICD-10-CM | POA: Diagnosis not present

## 2022-09-06 LAB — HM DIABETES EYE EXAM

## 2022-09-10 ENCOUNTER — Other Ambulatory Visit: Payer: Self-pay | Admitting: Family Medicine

## 2022-09-10 DIAGNOSIS — E0821 Diabetes mellitus due to underlying condition with diabetic nephropathy: Secondary | ICD-10-CM

## 2022-09-10 NOTE — Telephone Encounter (Signed)
Requested Prescriptions  Refused Prescriptions Disp Refills   glipiZIDE (GLUCOTROL XL) 5 MG 24 hr tablet [Pharmacy Med Name: GLIPIZIDE XL TAB 5MG ] 90 tablet 3    Sig: TAKE 1 TABLET DAILY WITH   BREAKFAST     Endocrinology:  Diabetes - Sulfonylureas Failed - 09/10/2022 11:54 AM      Failed - Cr in normal range and within 360 days    Creatinine, Ser  Date Value Ref Range Status  05/25/2022 1.35 (H) 0.76 - 1.27 mg/dL Final   Creatinine, POC  Date Value Ref Range Status  12/03/2016 na mg/dL Final   Creatinine, Urine  Date Value Ref Range Status  01/09/2022 3.0 g/L  Final         Passed - HBA1C is between 0 and 7.9 and within 180 days    Hemoglobin A1C  Date Value Ref Range Status  08/13/2022 7.9 (A) 4.0 - 5.6 % Final   Hgb A1c MFr Bld  Date Value Ref Range Status  04/30/2022 9.3 (H) 4.8 - 5.6 % Final    Comment:             Prediabetes: 5.7 - 6.4          Diabetes: >6.4          Glycemic control for adults with diabetes: <7.0          Passed - Valid encounter within last 6 months    Recent Outpatient Visits           4 weeks ago Diabetes mellitus with nephropathy (HCC)   [place] [facility] Malva Limes, MD   4 months ago Vitamin D deficiency   [place] [facility] Malva Limes, MD   7 months ago Diabetes mellitus without complication [facility])   [place] [facility] Malva Limes, MD   1 year ago Controlled type 2 diabetes mellitus with diabetic nephropathy, without long-term current use of insulin (HCC)   [place] [facility] Malva Limes, MD   1 year ago Controlled type 2 diabetes mellitus with diabetic nephropathy, without long-term current use of insulin (HCC)   [place] [facility] Malva Limes, MD       Future Appointments             In 3 months Fisher, Demetrios Isaacs, MD [facility], [facility]

## 2022-09-20 ENCOUNTER — Telehealth: Payer: Self-pay | Admitting: Family Medicine

## 2022-09-20 NOTE — Telephone Encounter (Signed)
Patient is asking for samples of Jardiance 25 mg.  Please let patient know when ready.

## 2022-09-23 NOTE — Telephone Encounter (Signed)
He can have 4 weeks samples.  

## 2022-09-24 ENCOUNTER — Telehealth: Payer: Self-pay | Admitting: Family Medicine

## 2022-09-24 ENCOUNTER — Other Ambulatory Visit: Payer: Self-pay

## 2022-09-24 DIAGNOSIS — E0821 Diabetes mellitus due to underlying condition with diabetic nephropathy: Secondary | ICD-10-CM

## 2022-09-24 MED ORDER — GLIPIZIDE ER 5 MG PO TB24: 5.0000 mg | ORAL_TABLET | Freq: Every day | ORAL | 4 refills | Status: DC

## 2022-09-24 NOTE — Telephone Encounter (Signed)
Patient given 4 boxes Jardiance 25 mg.

## 2022-09-24 NOTE — Telephone Encounter (Signed)
Patient is needing refills on Glipizide 5 mg sent to [facility] in service

## 2022-11-05 ENCOUNTER — Telehealth: Payer: Self-pay | Admitting: Family Medicine

## 2022-11-05 DIAGNOSIS — Z79899 Other long term (current) drug therapy: Secondary | ICD-10-CM | POA: Diagnosis not present

## 2022-11-05 DIAGNOSIS — I119 Hypertensive heart disease without heart failure: Secondary | ICD-10-CM | POA: Diagnosis not present

## 2022-11-05 DIAGNOSIS — E782 Mixed hyperlipidemia: Secondary | ICD-10-CM | POA: Diagnosis not present

## 2022-11-05 DIAGNOSIS — Z6839 Body mass index (BMI) 39.0-39.9, adult: Secondary | ICD-10-CM | POA: Diagnosis not present

## 2022-11-05 DIAGNOSIS — Z87891 Personal history of nicotine dependence: Secondary | ICD-10-CM | POA: Diagnosis not present

## 2022-11-05 DIAGNOSIS — I429 Cardiomyopathy, unspecified: Secondary | ICD-10-CM | POA: Diagnosis not present

## 2022-11-05 DIAGNOSIS — E1121 Type 2 diabetes mellitus with diabetic nephropathy: Secondary | ICD-10-CM | POA: Diagnosis not present

## 2022-11-05 DIAGNOSIS — I2510 Atherosclerotic heart disease of native coronary artery without angina pectoris: Secondary | ICD-10-CM | POA: Diagnosis not present

## 2022-11-05 NOTE — Telephone Encounter (Signed)
Patient is in need of samples for Jardiance 25 mg. Please let him know when they are ready.

## 2022-11-08 ENCOUNTER — Other Ambulatory Visit: Payer: Self-pay | Admitting: Family Medicine

## 2022-11-08 DIAGNOSIS — E1121 Type 2 diabetes mellitus with diabetic nephropathy: Secondary | ICD-10-CM

## 2022-11-08 NOTE — Telephone Encounter (Signed)
Patient advised we do not have any samples of jardiance at this time.

## 2022-11-22 ENCOUNTER — Telehealth: Payer: Self-pay | Admitting: Family Medicine

## 2022-11-22 NOTE — Telephone Encounter (Signed)
No samples available of the 25 mg and only one box of 10 mg

## 2022-11-22 NOTE — Telephone Encounter (Signed)
He can have 4 weeks supply

## 2022-11-22 NOTE — Telephone Encounter (Signed)
Pt stated he has been out of his medication for around 3 weeks. . Stated Rx is $150.00 for a 30-day supply.   Please advise.

## 2022-11-22 NOTE — Telephone Encounter (Signed)
NO samples available. Rx refill was sent in for patient on 11/08/22 #[id number]. Is he still needing sample? CRM created. Ok for [facility] to advise and verify if patient is still needing samples

## 2022-11-22 NOTE — Telephone Encounter (Signed)
Patient is asking for samples of Jardiance 25 mg.  Please let him know if they are available.

## 2022-11-24 NOTE — Telephone Encounter (Signed)
Please advise patient we don't have samples

## 2022-11-26 NOTE — Telephone Encounter (Signed)
Patient advised in message on phone

## 2022-12-17 ENCOUNTER — Ambulatory Visit (INDEPENDENT_AMBULATORY_CARE_PROVIDER_SITE_OTHER): Payer: Medicare HMO | Admitting: Family Medicine

## 2022-12-17 ENCOUNTER — Encounter: Payer: Self-pay | Admitting: Family Medicine

## 2022-12-17 VITALS — BP 124/76 | HR 76 | Temp 98.1°F | Resp 12 | Ht 66.5 in | Wt 242.8 lb

## 2022-12-17 DIAGNOSIS — E1121 Type 2 diabetes mellitus with diabetic nephropathy: Secondary | ICD-10-CM | POA: Diagnosis not present

## 2022-12-17 DIAGNOSIS — I10 Essential (primary) hypertension: Secondary | ICD-10-CM

## 2022-12-17 DIAGNOSIS — Z7985 Long-term (current) use of injectable non-insulin antidiabetic drugs: Secondary | ICD-10-CM

## 2022-12-17 LAB — POCT GLYCOSYLATED HEMOGLOBIN (HGB A1C): Hemoglobin A1C: 8.2 % — AB (ref 4.0–5.6)

## 2022-12-17 MED ORDER — OZEMPIC (0.25 OR 0.5 MG/DOSE) 2 MG/3ML ~~LOC~~ SOPN: 0.5000 mg | PEN_INJECTOR | SUBCUTANEOUS | 5 refills | Status: DC

## 2022-12-17 NOTE — Progress Notes (Signed)
Established patient visit   Patient: Todd Potter   DOB: Sep 16, 1950   72 y.o. Male  MRN: [id number] Visit Date: 12/17/2022  Today's healthcare provider: Mila Merry, MD   Chief Complaint  Patient presents with   Diabetes   Subjective    Diabetes Pertinent negatives for hypoglycemia include no headaches. Pertinent negatives for diabetes include no chest pain and no weakness.   Here to follow up for diabetes since changing Janumet to metformin only, continues on Jardiance, glipizide and Ozempic 0.25mg  which he is tolerating well. Specifically denies any GI side effects. Last A1c in April was 7.9  Medications: Outpatient Medications Prior to Visit  Medication Sig   amLODipine (NORVASC) 10 MG tablet Take 1 tablet (10 mg total) by mouth daily.   aspirin EC 81 MG tablet Take 81 mg by mouth daily. Swallow whole.   atorvastatin (LIPITOR) 80 MG tablet Take 1 tablet (80 mg total) by mouth daily.   Cholecalciferol (VITAMIN D3) 50 MCG (2000 UT) CHEW Chew 2,000 Units by mouth daily.   glipiZIDE (GLUCOTROL XL) 5 MG 24 hr tablet Take 1 tablet (5 mg total) by mouth daily with breakfast.   JARDIANCE 25 MG TABS tablet TAKE 1 TABLET (25 MG TOTAL) BY MOUTH DAILY.   metFORMIN (GLUCOPHAGE-XR) 500 MG 24 hr tablet Take 1,000 mg by mouth daily with breakfast.   metoprolol (TOPROL-XL) 200 MG 24 hr tablet Take 1 tablet (200 mg total) by mouth daily.   nitroGLYCERIN (NITROSTAT) 0.4 MG SL tablet Place 1 tablet (0.4 mg total) under the tongue every 5 (five) minutes as needed for chest pain. 3-5 minutes x3 as nedded   spironolactone (ALDACTONE) 25 MG tablet Take 25 mg by mouth daily.   telmisartan (MICARDIS) 80 MG tablet Take 80 mg by mouth daily.   [DISCONTINUED] OZEMPIC, 0.25 OR 0.5 MG/DOSE, 2 MG/3ML SOPN Inject 0.25 mg into the skin once a week.   [DISCONTINUED] SitaGLIPtin-MetFORMIN HCl (JANUMET XR) [phone] MG TB24 TAKE 1 TABLET BY MOUTH EVERY DAY   No facility-administered medications prior to  visit.    Review of Systems  Respiratory: Negative.  Negative for cough, shortness of breath and wheezing.   Cardiovascular:  Negative for chest pain, palpitations and leg swelling.  Neurological:  Negative for weakness and headaches.       Objective    BP 124/76 (BP Location: Left Arm, Patient Position: Sitting, Cuff Size: Large)   Pulse 76   Temp 98.1 F (36.7 C) (Temporal)   Resp 12   Ht 5' 6.5" (1.689 m)   Wt 242 lb 12.8 oz (110.1 kg)   BMI 38.60 kg/m    Physical Exam  General appearance: Obese male, cooperative and in no acute distress Head: Normocephalic, without obvious abnormality, atraumatic Respiratory: Respirations even and unlabored, normal respiratory rate Extremities: All extremities are intact.  Skin: Skin color, texture, turgor normal. No rashes seen  Psych: Appropriate mood and affect. Neurologic: Mental status: Alert, oriented to person, place, and time, thought content appropriate.   Results for orders placed or performed in visit on 12/17/22  POCT glycosylated hemoglobin (Hb A1C)  Result Value Ref Range   Hemoglobin A1C 8.2 (A) 4.0 - 5.6 %    Assessment & Plan     1. Diabetes mellitus with nephropathy (HCC) Doing well on current medications. A1c not  at goal. Increase Ozempic to 0.5mg  weekly - OZEMPIC, 0.25 OR 0.5 MG/DOSE, 2 MG/3ML SOPN; Inject 0.5 mg into the skin once a week.  Dispense: 3 mL; Refill: 5  2. Essential hypertension Well controlled.  Continue current medications.    He anticipates getting flu shot at work.    Return in about 4 months (around 04/18/2023) for Diabetes, Hypertension.        Mila Merry, MD  [facility] [phone] (phone) [phone] (fax)  [facility]

## 2023-01-07 ENCOUNTER — Other Ambulatory Visit: Payer: Self-pay | Admitting: Family Medicine

## 2023-01-07 DIAGNOSIS — I10 Essential (primary) hypertension: Secondary | ICD-10-CM

## 2023-01-07 DIAGNOSIS — E7800 Pure hypercholesterolemia, unspecified: Secondary | ICD-10-CM

## 2023-01-19 ENCOUNTER — Other Ambulatory Visit: Payer: Self-pay | Admitting: Family Medicine

## 2023-01-19 DIAGNOSIS — E7800 Pure hypercholesterolemia, unspecified: Secondary | ICD-10-CM

## 2023-01-19 DIAGNOSIS — I10 Essential (primary) hypertension: Secondary | ICD-10-CM

## 2023-01-19 MED ORDER — AMLODIPINE BESYLATE 10 MG PO TABS: 10.0000 mg | ORAL_TABLET | Freq: Every day | ORAL | 4 refills | Status: DC

## 2023-01-19 MED ORDER — ATORVASTATIN CALCIUM 80 MG PO TABS: 80.0000 mg | ORAL_TABLET | Freq: Every day | ORAL | 4 refills | Status: DC

## 2023-01-19 MED ORDER — METOPROLOL SUCCINATE ER 200 MG PO TB24: 200.0000 mg | ORAL_TABLET | Freq: Every day | ORAL | 4 refills | Status: DC

## 2023-01-21 DIAGNOSIS — Z23 Encounter for immunization: Secondary | ICD-10-CM | POA: Diagnosis not present

## 2023-02-01 ENCOUNTER — Telehealth: Payer: Self-pay | Admitting: Family Medicine

## 2023-02-01 ENCOUNTER — Other Ambulatory Visit: Payer: Self-pay

## 2023-02-01 DIAGNOSIS — E1121 Type 2 diabetes mellitus with diabetic nephropathy: Secondary | ICD-10-CM

## 2023-02-01 NOTE — Telephone Encounter (Signed)
Patient came by the office asking for samples of Jardiance 25 mg.   And also needs an rx sent in for Jardiance 25 mg. to [facility]  fax # [phone]  and if needed their phone # is [phone].

## 2023-02-02 MED ORDER — EMPAGLIFLOZIN 25 MG PO TABS: 25.0000 mg | ORAL_TABLET | Freq: Every day | ORAL | 5 refills | Status: DC

## 2023-02-02 NOTE — Addendum Note (Signed)
Addended by: Marjie Skiff on: 02/02/2023 11:18 AM   Modules accepted: Orders

## 2023-02-02 NOTE — Telephone Encounter (Signed)
Prescription printed to be [facility].  Ok to provide samples?

## 2023-03-25 ENCOUNTER — Other Ambulatory Visit: Payer: Self-pay | Admitting: Family Medicine

## 2023-03-25 DIAGNOSIS — E1121 Type 2 diabetes mellitus with diabetic nephropathy: Secondary | ICD-10-CM

## 2023-03-25 MED ORDER — EMPAGLIFLOZIN 25 MG PO TABS: 25.0000 mg | ORAL_TABLET | Freq: Every day | ORAL | 5 refills | Status: DC

## 2023-03-25 NOTE — Telephone Encounter (Signed)
Requested Prescriptions  Pending Prescriptions Disp Refills   empagliflozin (JARDIANCE) 25 MG TABS tablet 30 tablet 5    Sig: Take 1 tablet (25 mg total) by mouth daily.     Endocrinology:  Diabetes - SGLT2 Inhibitors Failed - 03/25/2023  1:48 PM      Failed - Cr in normal range and within 360 days    Creatinine, Ser  Date Value Ref Range Status  05/25/2022 1.35 (H) 0.76 - 1.27 mg/dL Final   Creatinine, POC  Date Value Ref Range Status  12/03/2016 na mg/dL Final   Creatinine, Urine  Date Value Ref Range Status  01/09/2022 3.0 g/L  Final         Failed - HBA1C is between 0 and 7.9 and within 180 days    Hemoglobin A1C  Date Value Ref Range Status  12/17/2022 8.2 (A) 4.0 - 5.6 % Final   Hgb A1c MFr Bld  Date Value Ref Range Status  04/30/2022 9.3 (H) 4.8 - 5.6 % Final    Comment:             Prediabetes: 5.7 - 6.4          Diabetes: >6.4          Glycemic control for adults with diabetes: <7.0          Failed - eGFR in normal range and within 360 days    GFR calc Af Amer  Date Value Ref Range Status  01/07/2020 84 >59 mL/min/1.73 Final    Comment:    **[facility] currently reports eGFR in compliance with the current**   recommendations of the [employer]. [facility] will   update reporting as new guidelines are published from the NKF-ASN   Task force.    GFR calc non Af Amer  Date Value Ref Range Status  01/07/2020 72 >59 mL/min/1.73 Final   eGFR  Date Value Ref Range Status  05/25/2022 55 (L) >59 mL/min/1.73 Final         Passed - Valid encounter within last 6 months    Recent Outpatient Visits           3 months ago Diabetes mellitus with nephropathy (HCC)   [place] [facility] Malva Limes, MD   7 months ago Diabetes mellitus with nephropathy [facility])   [place] [facility] Malva Limes, MD   10 months ago Vitamin D deficiency   [place] [facility] Malva Limes, MD   1 year ago  Diabetes mellitus without complication [facility])   [place] [facility] Malva Limes, MD   1 year ago Controlled type 2 diabetes mellitus with diabetic nephropathy, without long-term current use of insulin (HCC)    [facility] Malva Limes, MD       Future Appointments             In 4 weeks Fisher, Demetrios Isaacs, MD [facility], [facility]

## 2023-03-25 NOTE — Telephone Encounter (Signed)
Medication Refill -  Most Recent Primary Care Visit:  Provider: Malva Limes  Department: [facility]  Visit Type: OFFICE VISIT  Date: 12/17/2022  Medication: empagliflozin (JARDIANCE) 25 MG TABS tablet [[id number]]   Has the patient contacted their pharmacy? Yes  (Agent: If yes, when and what did the pharmacy advise?) Contact Provider   Is this the correct pharmacy for this prescription? Yes  This is the patient's preferred pharmacy:  [id number] [place] - [address] [place] [address] Phone: [phone] Fax: [phone]   Has the prescription been filled recently? Yes  Is the patient out of the medication? Yes  Has the patient been seen for an appointment in the last year OR does the patient have an upcoming appointment? Yes  Can we respond through MyChart? Yes  Agent: Please be advised that Rx refills may take up to 3 business days. We ask that you follow-up with your pharmacy.

## 2023-04-22 ENCOUNTER — Ambulatory Visit (INDEPENDENT_AMBULATORY_CARE_PROVIDER_SITE_OTHER): Payer: Medicare HMO | Admitting: Family Medicine

## 2023-04-22 VITALS — BP 135/77 | HR 74 | Resp 18 | Ht 66.5 in | Wt 244.0 lb

## 2023-04-22 DIAGNOSIS — E7800 Pure hypercholesterolemia, unspecified: Secondary | ICD-10-CM

## 2023-04-22 DIAGNOSIS — Z7984 Long term (current) use of oral hypoglycemic drugs: Secondary | ICD-10-CM | POA: Diagnosis not present

## 2023-04-22 DIAGNOSIS — I10 Essential (primary) hypertension: Secondary | ICD-10-CM

## 2023-04-22 DIAGNOSIS — Z125 Encounter for screening for malignant neoplasm of prostate: Secondary | ICD-10-CM

## 2023-04-22 DIAGNOSIS — E559 Vitamin D deficiency, unspecified: Secondary | ICD-10-CM | POA: Diagnosis not present

## 2023-04-22 DIAGNOSIS — E1121 Type 2 diabetes mellitus with diabetic nephropathy: Secondary | ICD-10-CM | POA: Diagnosis not present

## 2023-04-22 LAB — POCT GLYCOSYLATED HEMOGLOBIN (HGB A1C): Hemoglobin A1C: 7.4 % — AB (ref 4.0–5.6)

## 2023-04-22 NOTE — Progress Notes (Signed)
 Established patient visit   Patient: Todd Potter   DOB: 1950/07/31   73 y.o. Male  MRN: [id number] Visit Date: 04/22/2023  Today's healthcare provider: Nancyann Perry, MD   Chief Complaint  Patient presents with   Diabetes   Hypertension   Subjective    Diabetes Pertinent negatives for diabetes include no chest pain.  Hypertension Pertinent negatives include no chest pain, palpitations or shortness of breath.    Follow up htn, dm, lipids. Feels well, no complaints today. Tolerating meds without adverse effects.   Lab Results  Component Value Date   HGBA1C 7.4 (A) 04/22/2023   HGBA1C 8.2 (A) 12/17/2022   HGBA1C 7.9 (A) 08/13/2022   Lab Results  Component Value Date   NA 138 05/25/2022   K 4.3 05/25/2022   CREATININE 1.35 (H) 05/25/2022   EGFR 55 (L) 05/25/2022   GLUCOSE 148 (H) 05/25/2022   Lab Results  Component Value Date   CHOL 122 01/22/2022   HDL 44 01/22/2022   LDLCALC 65 01/22/2022   TRIG 60 01/22/2022   CHOLHDL 2.8 01/22/2022     Medications: Outpatient Medications Prior to Visit  Medication Sig   amLODipine  (NORVASC ) 10 MG tablet Take 1 tablet (10 mg total) by mouth daily.   aspirin  EC 81 MG tablet Take 81 mg by mouth daily. Swallow whole.   atorvastatin  (LIPITOR) 80 MG tablet Take 1 tablet (80 mg total) by mouth daily.   Cholecalciferol (VITAMIN D3) 50 MCG (2000 UT) CHEW Chew 2,000 Units by mouth daily.   empagliflozin  (JARDIANCE ) 25 MG TABS tablet Take 1 tablet (25 mg total) by mouth daily.   glipiZIDE  (GLUCOTROL  XL) 5 MG 24 hr tablet Take 1 tablet (5 mg total) by mouth daily with breakfast.   metFORMIN  (GLUCOPHAGE -XR) 500 MG 24 hr tablet Take 1,000 mg by mouth daily with breakfast.   metoprolol  (TOPROL -XL) 200 MG 24 hr tablet Take 1 tablet (200 mg total) by mouth daily.   nitroGLYCERIN  (NITROSTAT ) 0.4 MG SL tablet Place 1 tablet (0.4 mg total) under the tongue every 5 (five) minutes as needed for chest pain. 3-5 minutes x3 as nedded    OZEMPIC , 0.25 OR 0.5 MG/DOSE, 2 MG/3ML SOPN Inject 0.5 mg into the skin once a week.   spironolactone (ALDACTONE) 25 MG tablet Take 25 mg by mouth daily.   telmisartan (MICARDIS) 80 MG tablet Take 80 mg by mouth daily.   No facility-administered medications prior to visit.    Review of Systems  Constitutional:  Negative for appetite change, chills and fever.  Respiratory:  Negative for chest tightness, shortness of breath and wheezing.   Cardiovascular:  Negative for chest pain and palpitations.  Gastrointestinal:  Negative for abdominal pain, nausea and vomiting.       Objective    BP 135/77   Pulse 74   Resp 18   Ht 5' 6.5 (1.689 m)   Wt 244 lb (110.7 kg)   SpO2 95%   BMI 38.79 kg/m    Physical Exam   General: Appearance:    Obese male in no acute distress  Eyes:    PERRL, conjunctiva/corneas clear, EOM's intact       Lungs:     Clear to auscultation bilaterally, respirations unlabored  Heart:    Normal heart rate. Normal rhythm. No murmurs, rubs, or gallops.    MS:   All extremities are intact.    Neurologic:   Awake, alert, oriented x 3. No apparent focal neurological  defect.        Results for orders placed or performed in visit on 04/22/23  POCT glycosylated hemoglobin (Hb A1C)  Result Value Ref Range   Hemoglobin A1C 7.4 (A) 4.0 - 5.6 %    Assessment & Plan     1. Diabetes mellitus with nephropathy (HCC) (Primary) Fairly well controlled. Continue current medications.   - Urine Albumin/Creatinine with ratio (send out) [LAB689]  2. Essential hypertension Well controlled.  Continue current medications.    3. Pure hypercholesterolemia He is tolerating atorvastatin  well with no adverse effects.   - CBC - Comprehensive metabolic panel - Lipid panel  4. Vitamin D  deficiency  - VITAMIN D  25 Hydroxy (Vit-D Deficiency, Fractures)  5. Prostate cancer screening  - PSA   Return in about 6 months (around 10/20/2023) for Diabetes, Hypertension.         Nancyann Perry, MD  [facility] [phone] (phone) [phone] (fax)  [facility]

## 2023-04-22 NOTE — Patient Instructions (Signed)
 Marland Kitchen  Please review the attached list of medications and notify my office if there are any errors.   . Please bring all of your medications to every appointment so we can make sure that our medication list is the same as yours.

## 2023-04-23 LAB — LIPID PANEL
Chol/HDL Ratio: 3.7 {ratio} (ref 0.0–5.0)
Cholesterol, Total: 119 mg/dL (ref 100–199)
HDL: 32 mg/dL — ABNORMAL LOW (ref >39)
LDL Chol Calc (NIH): 67 mg/dL (ref 0–99)
Triglycerides: 105 mg/dL (ref 0–149)
VLDL Cholesterol Cal: 20 mg/dL (ref 5–40)

## 2023-04-23 LAB — COMPREHENSIVE METABOLIC PANEL
ALT: 14 [IU]/L (ref 0–44)
AST: 11 [IU]/L (ref 0–40)
Albumin: 4 g/dL (ref 3.8–4.8)
Alkaline Phosphatase: 146 [IU]/L — ABNORMAL HIGH (ref 44–121)
BUN/Creatinine Ratio: 13 (ref 10–24)
BUN: 17 mg/dL (ref 8–27)
Bilirubin Total: 0.3 mg/dL (ref 0.0–1.2)
CO2: 21 mmol/L (ref 20–29)
Calcium: 10.1 mg/dL (ref 8.6–10.2)
Chloride: 103 mmol/L (ref 96–106)
Creatinine, Ser: 1.29 mg/dL — ABNORMAL HIGH (ref 0.76–1.27)
Globulin, Total: 3.8 g/dL (ref 1.5–4.5)
Glucose: 168 mg/dL — ABNORMAL HIGH (ref 70–99)
Potassium: 4.2 mmol/L (ref 3.5–5.2)
Sodium: 139 mmol/L (ref 134–144)
Total Protein: 7.8 g/dL (ref 6.0–8.5)
eGFR: 59 mL/min/{1.73_m2} — ABNORMAL LOW (ref >59)

## 2023-04-23 LAB — CBC
Hematocrit: 45.4 % (ref 37.5–51.0)
Hemoglobin: 14.3 g/dL (ref 13.0–17.7)
MCH: 27.6 pg (ref 26.6–33.0)
MCHC: 31.5 g/dL (ref 31.5–35.7)
MCV: 88 fL (ref 79–97)
Platelets: 192 10*3/uL (ref 150–450)
RBC: 5.19 x10E6/uL (ref 4.14–5.80)
RDW: 13.5 % (ref 11.6–15.4)
WBC: 11.4 10*3/uL — ABNORMAL HIGH (ref 3.4–10.8)

## 2023-04-23 LAB — MICROALBUMIN / CREATININE URINE RATIO
Creatinine, Urine: 55.7 mg/dL
Microalb/Creat Ratio: 41 mg/g{creat} — ABNORMAL HIGH (ref 0–29)
Microalbumin, Urine: 22.8 ug/mL

## 2023-04-23 LAB — PSA: Prostate Specific Ag, Serum: 0.9 ng/mL (ref 0.0–4.0)

## 2023-04-23 LAB — VITAMIN D 25 HYDROXY (VIT D DEFICIENCY, FRACTURES): Vit D, 25-Hydroxy: 42.1 ng/mL (ref 30.0–100.0)

## 2023-06-19 DIAGNOSIS — E1159 Type 2 diabetes mellitus with other circulatory complications: Secondary | ICD-10-CM | POA: Diagnosis not present

## 2023-06-19 DIAGNOSIS — E11319 Type 2 diabetes mellitus with unspecified diabetic retinopathy without macular edema: Secondary | ICD-10-CM | POA: Diagnosis not present

## 2023-06-19 DIAGNOSIS — Z008 Encounter for other general examination: Secondary | ICD-10-CM | POA: Diagnosis not present

## 2023-06-19 DIAGNOSIS — M1990 Unspecified osteoarthritis, unspecified site: Secondary | ICD-10-CM | POA: Diagnosis not present

## 2023-06-19 DIAGNOSIS — I509 Heart failure, unspecified: Secondary | ICD-10-CM | POA: Diagnosis not present

## 2023-06-19 DIAGNOSIS — E785 Hyperlipidemia, unspecified: Secondary | ICD-10-CM | POA: Diagnosis not present

## 2023-06-19 DIAGNOSIS — N1831 Chronic kidney disease, stage 3a: Secondary | ICD-10-CM | POA: Diagnosis not present

## 2023-06-19 DIAGNOSIS — Z8249 Family history of ischemic heart disease and other diseases of the circulatory system: Secondary | ICD-10-CM | POA: Diagnosis not present

## 2023-06-19 DIAGNOSIS — I69354 Hemiplegia and hemiparesis following cerebral infarction affecting left non-dominant side: Secondary | ICD-10-CM | POA: Diagnosis not present

## 2023-06-19 DIAGNOSIS — I130 Hypertensive heart and chronic kidney disease with heart failure and stage 1 through stage 4 chronic kidney disease, or unspecified chronic kidney disease: Secondary | ICD-10-CM | POA: Diagnosis not present

## 2023-06-19 DIAGNOSIS — E1122 Type 2 diabetes mellitus with diabetic chronic kidney disease: Secondary | ICD-10-CM | POA: Diagnosis not present

## 2023-06-19 DIAGNOSIS — I429 Cardiomyopathy, unspecified: Secondary | ICD-10-CM | POA: Diagnosis not present

## 2023-06-21 ENCOUNTER — Other Ambulatory Visit: Payer: Self-pay | Admitting: Family Medicine

## 2023-06-21 DIAGNOSIS — E1121 Type 2 diabetes mellitus with diabetic nephropathy: Secondary | ICD-10-CM

## 2023-06-22 NOTE — Telephone Encounter (Signed)
 Requested Prescriptions  Pending Prescriptions Disp Refills   OZEMPIC, 0.25 OR 0.5 MG/DOSE, 2 MG/3ML SOPN [Pharmacy Med Name: OZEMPIC 0.25-0.5 MG/DOSE PEN] 3 mL 0    Sig: INJECT 0.5 MG INTO THE SKIN ONE TIME PER WEEK     Endocrinology:  Diabetes - GLP-1 Receptor Agonists - semaglutide Failed - 06/22/2023  8:24 AM      Failed - HBA1C in normal range and within 180 days    Hemoglobin A1C  Date Value Ref Range Status  04/22/2023 7.4 (A) 4.0 - 5.6 % Final   Hgb A1c MFr Bld  Date Value Ref Range Status  04/30/2022 9.3 (H) 4.8 - 5.6 % Final    Comment:             Prediabetes: 5.7 - 6.4          Diabetes: >6.4          Glycemic control for adults with diabetes: <7.0          Failed - Cr in normal range and within 360 days    Creatinine, Ser  Date Value Ref Range Status  04/22/2023 1.29 (H) 0.76 - 1.27 mg/dL Final   Creatinine, POC  Date Value Ref Range Status  12/03/2016 na mg/dL Final   Creatinine, Urine  Date Value Ref Range Status  01/09/2022 3.0 g/L  Final         Passed - Valid encounter within last 6 months    Recent Outpatient Visits           2 months ago Diabetes mellitus with nephropathy (HCC)   [place] [facility] Malva Limes, MD   6 months ago Diabetes mellitus with nephropathy [facility])   [place] [facility] Malva Limes, MD   10 months ago Diabetes mellitus with nephropathy [facility])   [place] [facility] Malva Limes, MD   1 year ago Vitamin D deficiency   [place] [facility] Malva Limes, MD   1 year ago Diabetes mellitus without complication [facility])   [place] [facility] Malva Limes, MD       Future Appointments             In 4 months Fisher, Demetrios Isaacs, MD [facility], [facility]

## 2023-07-23 ENCOUNTER — Other Ambulatory Visit: Payer: Self-pay | Admitting: Family Medicine

## 2023-07-23 DIAGNOSIS — E1121 Type 2 diabetes mellitus with diabetic nephropathy: Secondary | ICD-10-CM

## 2023-08-19 DIAGNOSIS — E782 Mixed hyperlipidemia: Secondary | ICD-10-CM | POA: Diagnosis not present

## 2023-08-19 DIAGNOSIS — I10 Essential (primary) hypertension: Secondary | ICD-10-CM | POA: Diagnosis not present

## 2023-08-19 DIAGNOSIS — E1121 Type 2 diabetes mellitus with diabetic nephropathy: Secondary | ICD-10-CM | POA: Diagnosis not present

## 2023-08-19 DIAGNOSIS — I429 Cardiomyopathy, unspecified: Secondary | ICD-10-CM | POA: Diagnosis not present

## 2023-08-19 DIAGNOSIS — Z6837 Body mass index (BMI) 37.0-37.9, adult: Secondary | ICD-10-CM | POA: Diagnosis not present

## 2023-08-19 DIAGNOSIS — I2510 Atherosclerotic heart disease of native coronary artery without angina pectoris: Secondary | ICD-10-CM | POA: Diagnosis not present

## 2023-08-30 DIAGNOSIS — H524 Presbyopia: Secondary | ICD-10-CM | POA: Diagnosis not present

## 2023-08-30 DIAGNOSIS — E119 Type 2 diabetes mellitus without complications: Secondary | ICD-10-CM | POA: Diagnosis not present

## 2023-08-30 LAB — HM DIABETES EYE EXAM

## 2023-09-05 DIAGNOSIS — I2510 Atherosclerotic heart disease of native coronary artery without angina pectoris: Secondary | ICD-10-CM | POA: Diagnosis not present

## 2023-09-06 ENCOUNTER — Other Ambulatory Visit: Payer: Self-pay | Admitting: Family Medicine

## 2023-09-06 DIAGNOSIS — E0821 Diabetes mellitus due to underlying condition with diabetic nephropathy: Secondary | ICD-10-CM

## 2023-09-15 NOTE — Telephone Encounter (Signed)
 Todd Potter

## 2023-10-28 ENCOUNTER — Ambulatory Visit (INDEPENDENT_AMBULATORY_CARE_PROVIDER_SITE_OTHER): Payer: Self-pay | Admitting: Family Medicine

## 2023-10-28 ENCOUNTER — Encounter: Payer: Self-pay | Admitting: Family Medicine

## 2023-10-28 VITALS — BP 131/75 | HR 69 | Resp 16 | Ht 66.5 in | Wt 234.1 lb

## 2023-10-28 DIAGNOSIS — I10 Essential (primary) hypertension: Secondary | ICD-10-CM | POA: Diagnosis not present

## 2023-10-28 DIAGNOSIS — Z7984 Long term (current) use of oral hypoglycemic drugs: Secondary | ICD-10-CM | POA: Diagnosis not present

## 2023-10-28 DIAGNOSIS — E1121 Type 2 diabetes mellitus with diabetic nephropathy: Secondary | ICD-10-CM | POA: Diagnosis not present

## 2023-10-28 DIAGNOSIS — I2510 Atherosclerotic heart disease of native coronary artery without angina pectoris: Secondary | ICD-10-CM

## 2023-10-28 NOTE — Progress Notes (Signed)
 Established patient visit   Patient: Todd Potter   DOB: December 17, 1950   73 y.o. Male  MRN: [id number] Visit Date: 10/28/2023  Today's healthcare provider: Nancyann Perry, MD   Chief Complaint  Patient presents with   Medical Management of Chronic Issues    Follow-up T2DM, Vitamin D    Subjective    HPI Patient presents for follow up diabetes and hypertension. Feels well with no complaints. Taking medications consistently. No chest pain, dyspnea, or palpitations. No numbness or tingling in extremities. Has been trying to eat healthy and lose weight. Continues on 1mg  Ozempic  and is tolerating well. No formal exercise but stays active.   Lab Results  Component Value Date   HGBA1C 7.4 (A) 04/22/2023   Lab Results  Component Value Date   NA 139 04/22/2023   CL 103 04/22/2023   K 4.2 04/22/2023   CO2 21 04/22/2023   BUN 17 04/22/2023   CREATININE 1.29 (H) 04/22/2023   EGFR 59 (L) 04/22/2023   CALCIUM  10.1 04/22/2023   PHOS 3.1 05/25/2022   ALBUMIN 4.0 04/22/2023   GLUCOSE 168 (H) 04/22/2023   Lab Results  Component Value Date   VD25OH 42.1 04/22/2023   Wt Readings from Last 10 Encounters:  10/28/23 234 lb 1.6 oz (106.2 kg)  04/22/23 244 lb (110.7 kg)  12/17/22 242 lb 12.8 oz (110.1 kg)  08/13/22 253 lb (114.8 kg)  06/11/22 243 lb (110.2 kg)  04/30/22 246 lb 8 oz (111.8 kg)  01/22/22 244 lb (110.7 kg)  09/07/21 248 lb 6.4 oz (112.7 kg)  06/19/21 251 lb (113.9 kg)  02/06/21 246 lb (111.6 kg)  -   Medications: Outpatient Medications Prior to Visit  Medication Sig   amLODipine  (NORVASC ) 10 MG tablet Take 1 tablet (10 mg total) by mouth daily.   aspirin  EC 81 MG tablet Take 81 mg by mouth daily. Swallow whole.   atorvastatin  (LIPITOR) 80 MG tablet Take 1 tablet (80 mg total) by mouth daily.   Cholecalciferol (VITAMIN D3) 50 MCG (2000 UT) CHEW Chew 2,000 Units by mouth daily.   empagliflozin  (JARDIANCE ) 25 MG TABS tablet Take 1 tablet (25 mg total) by mouth  daily.   glipiZIDE  (GLUCOTROL  XL) 5 MG 24 hr tablet TAKE 1 TABLET DAILY WITH   BREAKFAST   metFORMIN  (GLUCOPHAGE -XR) 500 MG 24 hr tablet Take 1,000 mg by mouth daily with breakfast.   metoprolol  (TOPROL -XL) 200 MG 24 hr tablet Take 1 tablet (200 mg total) by mouth daily.   nitroGLYCERIN  (NITROSTAT ) 0.4 MG SL tablet Place 1 tablet (0.4 mg total) under the tongue every 5 (five) minutes as needed for chest pain. 3-5 minutes x3 as nedded   OZEMPIC , 0.25 OR 0.5 MG/DOSE, 2 MG/3ML SOPN Inject 0.5 mg as directed once a week.   spironolactone (ALDACTONE) 25 MG tablet Take 25 mg by mouth daily.   telmisartan (MICARDIS) 80 MG tablet Take 80 mg by mouth daily.   No facility-administered medications prior to visit.    Review of Systems  Constitutional:  Negative for appetite change, chills and fever.  Respiratory:  Negative for chest tightness, shortness of breath and wheezing.   Cardiovascular:  Negative for chest pain and palpitations.  Gastrointestinal:  Negative for abdominal pain, nausea and vomiting.       Objective    BP 131/75 (BP Location: Left Arm, Patient Position: Sitting, Cuff Size: Normal)   Pulse 69   Resp 16   Ht 5' 6.5 (1.689 m)  Wt 234 lb 1.6 oz (106.2 kg)   SpO2 97%   BMI 37.22 kg/m    Physical Exam   General: Appearance:    Mildly obese male in no acute distress  Eyes:    PERRL, conjunctiva/corneas clear, EOM's intact       Lungs:     Clear to auscultation bilaterally, respirations unlabored  Heart:    Normal heart rate. Normal rhythm. No murmurs, rubs, or gallops.    MS:   All extremities are intact.    Neurologic:   Awake, alert, oriented x 3. No apparent focal neurological defect.         Assessment & Plan     1. Diabetes mellitus with nephropathy (HCC) (Primary) Doing well on current medications with no adverse effects.  - Renal function panel - Hemoglobin A1c  2. Essential hypertension BP near goal. Continue current medications.    3. Coronary artery  disease involving native coronary artery of native heart without angina pectoris Asymptomatic. Compliant with medication.  Continue aggressive risk factor modification.    4. Morbid obesity (HCC) About 10 pound weight loss since last year. Consider increasing Ozempic  if A1c is not at goal.          Nancyann Perry, MD  [facility] [phone] (phone) [phone] (fax)  [facility]

## 2023-10-28 NOTE — Patient Instructions (Signed)
 Todd Potter  Please review the attached list of medications and notify my office if there are any errors.   . Please bring all of your medications to every appointment so we can make sure that our medication list is the same as yours.

## 2023-10-29 LAB — RENAL FUNCTION PANEL
Albumin: 4 g/dL (ref 3.8–4.8)
BUN/Creatinine Ratio: 10 (ref 10–24)
BUN: 11 mg/dL (ref 8–27)
CO2: 16 mmol/L — ABNORMAL LOW (ref 20–29)
Calcium: 9.4 mg/dL (ref 8.6–10.2)
Chloride: 101 mmol/L (ref 96–106)
Creatinine, Ser: 1.09 mg/dL (ref 0.76–1.27)
Glucose: 123 mg/dL — ABNORMAL HIGH (ref 70–99)
Phosphorus: 3 mg/dL (ref 2.8–4.1)
Potassium: 3.8 mmol/L (ref 3.5–5.2)
Sodium: 136 mmol/L (ref 134–144)
eGFR: 71 mL/min/1.73 (ref >59)

## 2023-10-29 LAB — HEMOGLOBIN A1C
Est. average glucose Bld gHb Est-mCnc: 148 mg/dL
Hgb A1c MFr Bld: 6.8 % — ABNORMAL HIGH (ref 4.8–5.6)

## 2023-10-30 ENCOUNTER — Ambulatory Visit: Payer: Self-pay | Admitting: Family Medicine

## 2023-11-30 DIAGNOSIS — E1140 Type 2 diabetes mellitus with diabetic neuropathy, unspecified: Secondary | ICD-10-CM | POA: Diagnosis not present

## 2023-11-30 DIAGNOSIS — I10 Essential (primary) hypertension: Secondary | ICD-10-CM | POA: Diagnosis not present

## 2023-11-30 DIAGNOSIS — E1121 Type 2 diabetes mellitus with diabetic nephropathy: Secondary | ICD-10-CM | POA: Diagnosis not present

## 2023-11-30 DIAGNOSIS — I639 Cerebral infarction, unspecified: Secondary | ICD-10-CM | POA: Diagnosis not present

## 2023-11-30 DIAGNOSIS — I2510 Atherosclerotic heart disease of native coronary artery without angina pectoris: Secondary | ICD-10-CM | POA: Diagnosis not present

## 2024-01-02 ENCOUNTER — Other Ambulatory Visit: Payer: Self-pay | Admitting: Family Medicine

## 2024-01-02 DIAGNOSIS — I10 Essential (primary) hypertension: Secondary | ICD-10-CM

## 2024-01-02 DIAGNOSIS — E7800 Pure hypercholesterolemia, unspecified: Secondary | ICD-10-CM

## 2024-01-09 ENCOUNTER — Telehealth: Payer: Self-pay | Admitting: Family Medicine

## 2024-01-09 ENCOUNTER — Other Ambulatory Visit: Payer: Self-pay

## 2024-01-09 MED ORDER — METOPROLOL SUCCINATE ER 200 MG PO TB24: 200.0000 mg | ORAL_TABLET | Freq: Every day | ORAL | 0 refills | Status: DC

## 2024-01-09 NOTE — Telephone Encounter (Signed)
 Patient needs 7 days of Metoprolol  Succimate ER 200 mg. Sent into [facility] on [address].    His mail order was just mailed today and he is out of his medication.

## 2024-02-03 DIAGNOSIS — Z23 Encounter for immunization: Secondary | ICD-10-CM | POA: Diagnosis not present

## 2024-04-01 ENCOUNTER — Other Ambulatory Visit: Payer: Self-pay | Admitting: Family Medicine

## 2024-04-01 DIAGNOSIS — E1121 Type 2 diabetes mellitus with diabetic nephropathy: Secondary | ICD-10-CM

## 2024-04-08 ENCOUNTER — Other Ambulatory Visit: Payer: Self-pay | Admitting: Family Medicine

## 2024-05-04 ENCOUNTER — Encounter: Payer: Self-pay | Admitting: Family Medicine

## 2024-05-04 ENCOUNTER — Ambulatory Visit: Admitting: Family Medicine

## 2024-05-04 VITALS — BP 125/48 | HR 76 | Resp 16 | Ht 66.0 in | Wt 226.0 lb

## 2024-05-04 DIAGNOSIS — I6930 Unspecified sequelae of cerebral infarction: Secondary | ICD-10-CM

## 2024-05-04 DIAGNOSIS — Z125 Encounter for screening for malignant neoplasm of prostate: Secondary | ICD-10-CM

## 2024-05-04 DIAGNOSIS — Z0000 Encounter for general adult medical examination without abnormal findings: Secondary | ICD-10-CM

## 2024-05-04 DIAGNOSIS — I10 Essential (primary) hypertension: Secondary | ICD-10-CM | POA: Diagnosis not present

## 2024-05-04 DIAGNOSIS — E559 Vitamin D deficiency, unspecified: Secondary | ICD-10-CM

## 2024-05-04 DIAGNOSIS — Z0001 Encounter for general adult medical examination with abnormal findings: Secondary | ICD-10-CM | POA: Diagnosis not present

## 2024-05-04 DIAGNOSIS — Z7984 Long term (current) use of oral hypoglycemic drugs: Secondary | ICD-10-CM

## 2024-05-04 DIAGNOSIS — E1121 Type 2 diabetes mellitus with diabetic nephropathy: Secondary | ICD-10-CM | POA: Diagnosis not present

## 2024-05-04 DIAGNOSIS — E7800 Pure hypercholesterolemia, unspecified: Secondary | ICD-10-CM

## 2024-05-04 DIAGNOSIS — I2510 Atherosclerotic heart disease of native coronary artery without angina pectoris: Secondary | ICD-10-CM | POA: Diagnosis not present

## 2024-05-04 DIAGNOSIS — Z23 Encounter for immunization: Secondary | ICD-10-CM

## 2024-05-04 MED ORDER — TETANUS-DIPHTH-ACELL PERTUSSIS 5-2-15.5 LF-MCG/0.5 IM SUSP: 0.5000 mL | Freq: Once | INTRAMUSCULAR | 0 refills | Status: AC

## 2024-05-04 MED ORDER — TETANUS-DIPHTH-ACELL PERTUSSIS 5-2-15.5 LF-MCG/0.5 IM SUSP: 0.5000 mL | Freq: Once | INTRAMUSCULAR | 0 refills | Status: DC

## 2024-05-04 NOTE — Progress Notes (Signed)
 "    Complete physical exam   Patient: Todd Potter   DOB: 1951-04-14   74 y.o. Male  MRN: [id number] Visit Date: 05/04/2024  Today's healthcare provider: Nancyann Perry, MD   Chief Complaint  Patient presents with   Annual Exam    CPE/ No other conerns   Subjective    Discussed the use of AI scribe software for clinical note transcription with the patient, who gave verbal consent to proceed.  History of Present Illness   Todd Potter is a 74 year old male who presents for a routine annual physical exam.  No chest pain, heart flutters, shortness of breath, stomach problems, cramping, or changes in bowel habits. He has not eaten this morning, only having a cup of coffee.  Regarding his type 2 diabetes, he mentions that his blood sugar levels have been 'fine' as far as he can tell, although he has not been checking them regularly. He has a meter to check his blood sugar but has not used it recently. No issues with his current medications and does not require any refills at this time.  He mentions a past stroke which has resulted in a limp, and he requests a form for a handicapped parking sticker due to knee trouble and the limp.     Lab Results  Component Value Date   CHOL 119 04/22/2023   HDL 32 (L) 04/22/2023   LDLCALC 67 04/22/2023   TRIG 105 04/22/2023   CHOLHDL 3.7 04/22/2023   Lab Results  Component Value Date   NA 136 10/28/2023   K 3.8 10/28/2023   CREATININE 1.09 10/28/2023   EGFR 71 10/28/2023   GLUCOSE 123 (H) 10/28/2023   Lab Results  Component Value Date   HGBA1C 6.8 (H) 10/28/2023   HGBA1C 7.4 (A) 04/22/2023   HGBA1C 8.2 (A) 12/17/2022   BP Readings from Last 3 Encounters:  05/04/24 (!) 125/48  10/28/23 131/75  04/22/23 135/77       Past Medical History:  Diagnosis Date   Acanthosis nigricans    Cardiomegaly    Coronary artery disease    Diabetes mellitus without complication (HCC)    History of adenomatous polyp of colon    Knee pain     Myocardial infarct (HCC)    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   Past Surgical History:  Procedure Laterality Date   CARDIAC CATHETERIZATION     Stent Placement x 5 to RCA    COLONOSCOPY WITH PROPOFOL  N/A 08/06/2016   Procedure: COLONOSCOPY WITH PROPOFOL ;  Surgeon: Lamar ONEIDA Holmes, MD;  Location: [facility] ENDOSCOPY;  Service: Endoscopy;  Laterality: N/A;   COLONOSCOPY WITH PROPOFOL  N/A 06/11/2022   Procedure: COLONOSCOPY WITH PROPOFOL ;  Surgeon: Maryruth Ole ONEIDA, MD;  Location: [facility] ENDOSCOPY;  Service: Endoscopy;  Laterality: N/A;   CYSTECTOMY  1970&1990   x2 on tail bone   Social History   Socioeconomic History   Marital status: Married    Spouse name: Not on file   Number of children: 2   Years of education: HS Grad   Highest education level: High school graduate  Occupational History   Occupation: Part-Time    Comment: [employer]  Tobacco Use   Smoking status: Former    Current packs/day: 0.00    Average packs/day: 0.5 packs/day for 30.0 years (15.0 ttl pk-yrs)    Types: Cigarettes    Start date: 04/19/1976    Quit date: 04/19/2006    Years since  quitting: 18.0   Smokeless tobacco: Never  Vaping Use   Vaping status: Never Used  Substance and Sexual Activity   Alcohol use: Not Currently    Alcohol/week: 0.0 standard drinks of alcohol   Drug use: No   Sexual activity: Not on file  Other Topics Concern   Not on file  Social History Narrative   Not on file   Social Drivers of Health   Tobacco Use: Medium Risk (05/04/2024)   Patient History    Smoking Tobacco Use: Former    Smokeless Tobacco Use: Never    Passive Exposure: Not on Actuary Strain: Not on file  Food Insecurity: Not on file  Transportation Needs: Not on file  Physical Activity: Not on file  Stress: Not on file  Social Connections: Not on file  Intimate Partner Violence: Not on file  Depression (PHQ2-9): Low Risk (10/28/2023)   Depression (PHQ2-9)     PHQ-2 Score: 0  Alcohol Screen: Low Risk (04/30/2022)   Alcohol Screen    Last Alcohol Screening Score (AUDIT): 1  Housing: Unknown (08/19/2023)   Received from [facility]   Epic    Unable to Pay for Housing in the Last Year: Not on file    Number of Times Moved in the Last Year: Not on file    At any time in the past 12 months, were you homeless or living in a shelter (including now)?: No  Utilities: Not on file  Health Literacy: Not on file   Family Status  Relation Name Status   Mother  Deceased       complacations of diabetes   Father  Deceased       unknown   Sister  Deceased   Brother  Deceased       death due to alcohol   Sister  Alive   Brother  Deceased   Brother  Deceased  No partnership data on file   Family History  Problem Relation Age of Onset   Diabetes Mother    Diabetes Sister    Anuerysm Brother    Cancer Brother        brain   Diabetes Brother    Allergies[1]  Patient Care Team: Gasper Nancyann BRAVO, MD as PCP - General (Family Medicine) Atanacio Barefoot, MD (Cardiology) [place], Isaiah Feather, NP as Nurse Practitioner (Cardiology) [place], Royden DASEN, DPM as Consulting Physician (Podiatry) Pa, [facility] [place])   Medications: Show/hide medication list[2]  Review of Systems  Constitutional:  Negative for appetite change, chills and fever.  Respiratory:  Negative for chest tightness, shortness of breath and wheezing.   Cardiovascular:  Negative for chest pain and palpitations.  Gastrointestinal:  Negative for abdominal pain, nausea and vomiting.      Objective    BP (!) 125/48 (BP Location: Left Arm, Patient Position: Sitting, Cuff Size: Large)   Pulse 76   Resp 16   Ht 5' 6 (1.676 m)   Wt 226 lb (102.5 kg)   SpO2 98%   BMI 36.48 kg/m    Physical Exam  General Appearance:    Mildly obese male. Alert, cooperative, in no acute distress, appears stated age  Head:    Normocephalic, without obvious abnormality,  atraumatic  Eyes:    PERRL, conjunctiva/corneas clear, EOM's intact, fundi    benign, both eyes       Ears:    Normal TM's and external ear canals, both ears  Nose:   Nares  normal, septum midline, mucosa normal, no drainage   or sinus tenderness  Throat:   Lips, mucosa, and tongue normal; teeth and gums normal  Neck:   Supple, symmetrical, trachea midline, no adenopathy;       thyroid :  No enlargement/tenderness/nodules; no carotid   bruit or JVD  Back:     Symmetric, no curvature, ROM normal, no CVA tenderness  Lungs:     Clear to auscultation bilaterally, respirations unlabored  Chest wall:    No tenderness or deformity  Heart:    Normal heart rate. Normal rhythm. No murmurs, rubs, or gallops.  S1 and S2 normal  Abdomen:     Soft, non-tender, bowel sounds active all four quadrants,    no masses, no organomegaly  Genitalia:    deferred  Rectal:    deferred  Extremities:   All extremities are intact. No cyanosis or edema  Pulses:   2+ and symmetric all extremities  Skin:   Skin color, texture, turgor normal, no rashes or lesions  Lymph nodes:   Cervical, supraclavicular, and axillary nodes normal  Neurologic:   CNII-XII intact. Normal strength, sensation and reflexes      throughout       Last depression screening scores    10/28/2023    9:54 AM 04/22/2023   10:37 AM 12/17/2022   11:06 AM  PHQ 2/9 Scores  PHQ - 2 Score 0 0 0  PHQ- 9 Score  0  0      Data saved with a previous flowsheet row definition   Last fall risk screening    10/28/2023    9:54 AM  Fall Risk   Falls in the past year? 0  Number falls in past yr: 0  Injury with Fall? 0   Risk for fall due to : No Fall Risks     Data saved with a previous flowsheet row definition   Last Audit-C alcohol use screening    04/30/2022    8:37 AM  Alcohol Use Disorder Test (AUDIT)  1. How often do you have a drink containing alcohol? 1  2. How many drinks containing alcohol do you have on a typical day when you are  drinking? 0  3. How often do you have six or more drinks on one occasion? 0  AUDIT-C Score 1   A score of 3 or more in women, and 4 or more in men indicates increased risk for alcohol abuse, EXCEPT if all of the points are from question 1   No results found for any visits on 05/04/24.  Assessment & Plan    Routine Health Maintenance and Physical Exam  Exercise Activities and Dietary recommendations  Goals      Exercise 3x per week (30 min per time)     Recommend to start exercising 3 days a week for at least 30 minutes at a time.       Reduce portion size               Immunization History  Administered Date(s) Administered   Fluad Quad(high Dose 65+) 01/30/2021   INFLUENZA, HIGH DOSE SEASONAL PF 02/15/2020, 01/29/2022, 01/21/2023   Influenza,inj,Quad PF,6+ Mos 02/16/2017   Influenza-Unspecified 01/18/2015, 02/01/2016   PFIZER(Purple Top)SARS-COV-2 Vaccination 06/03/2019, 06/26/2019, 02/15/2020   Pfizer Covid-19 Vaccine Bivalent Booster 28yrs & up 01/30/2021   Pfizer(Comirnaty)Fall Seasonal Vaccine 12 years and older 01/29/2022, 01/21/2023   Pneumococcal Conjugate-13 02/07/2015   Pneumococcal Polysaccharide-23 04/19/2010, 08/25/2018   Td 10/25/1997   Tdap  03/10/2012   Zoster Recombinant(Shingrix) 12/17/2022, 03/24/2023    Health Maintenance  Topic Date Due   Medicare Annual Wellness (AWV)  02/22/2020   DTaP/Tdap/Td (3 - Td or Tdap) 03/10/2022   Influenza Vaccine  11/18/2023   COVID-19 Vaccine (7 - 2025-26 season) 12/19/2023   Diabetic kidney evaluation - Urine ACR  04/21/2024   HEMOGLOBIN A1C  04/29/2024   OPHTHALMOLOGY EXAM  08/29/2024   Diabetic kidney evaluation - eGFR measurement  10/27/2024   Colonoscopy  06/11/2029   Pneumococcal Vaccine: 50+ Years  Completed   Hepatitis C Screening  Completed   Zoster Vaccines- Shingrix  Completed   Meningococcal B Vaccine  Aged Out    Discussed health benefits of physical activity, and encouraged him to engage in  regular exercise appropriate for his age and condition.     Annual physical examination and preventive care Routine annual physical examination conducted. No acute issues or medication problems reported. - Ordered lab tests. - Provided application form for handicapped parking sticker.  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 Blood glucose levels reportedly well-managed. No symptoms of hyperglycemia or hypoglycemia. On Jardiance  and spironolactone for nephropathy. Check uACR today.   Essential hypertension Essential hypertension. No symptoms of hypertensive urgency or emergency.  History of cerebrovascular accident with residual deficit Residual deficit includes limping due to previous stroke and knee issues. - Provided application form for handicapped parking sticker.  Need for tetanus vaccination Due for tetanus vaccination as last received in 2014. Insurance does not cover tetanus vaccine in the clinic setting. - Provided prescription for tetanus vaccine to be obtained at a pharmacy.     Morbid obesity (HCC) BMI=36 with diabetes, CAD  and CVA history. Is working on trying to lose weight.   Coronary artery disease involving native coronary artery of native heart without angina pectoris Asymptomatic. Compliant with medication.  Continue aggressive risk factor modification.   - Lipid Panel With LDL/HDL Ratio - CBC  Vitamin D  deficiency - VITAMIN D  25 Hydroxy (Vit-D Deficiency, Fractures)  Pure hypercholesterolemia He is tolerating atorvastatin  well with no adverse effects.     Prostate cancer screening  - PSA Total (Reflex To Free)        Nancyann Perry, MD  [facility] [phone] (phone) [phone] (fax)  [facility]     [1] No Known Allergies [2]  Outpatient Medications Prior to Visit  Medication Sig   amLODipine  (NORVASC ) 10 MG tablet TAKE 1 TABLET  DAILY   aspirin  EC 81 MG tablet Take 81 mg by mouth daily. Swallow whole.   atorvastatin  (LIPITOR) 80 MG tablet TAKE 1 TABLET DAILY   Cholecalciferol (VITAMIN D3) 50 MCG (2000 UT) CHEW Chew 2,000 Units by mouth daily.   empagliflozin  (JARDIANCE ) 25 MG TABS tablet TAKE 1 TABLET (25 MG TOTAL) BY MOUTH DAILY.   glipiZIDE  (GLUCOTROL  XL) 5 MG 24 hr tablet TAKE 1 TABLET DAILY WITH   BREAKFAST   metFORMIN  (GLUCOPHAGE -XR) 500 MG 24 hr tablet Take 1,000 mg by mouth daily with breakfast.   metoprolol  (TOPROL -XL) 200 MG 24 hr tablet TAKE 1 TABLET DAILY   metoprolol  (TOPROL -XL) 200 MG 24 hr tablet TAKE 1 TABLET BY MOUTH DAILY FOR 7 DAYS.   nitroGLYCERIN  (NITROSTAT ) 0.4 MG SL tablet Place 1 tablet (0.4 mg total) under the tongue every 5 (five) minutes as needed for chest pain. 3-5 minutes x3 as nedded   OZEMPIC , 0.25 OR 0.5 MG/DOSE, 2 MG/3ML SOPN  Inject 0.5 mg as directed once a week.   spironolactone (ALDACTONE) 25 MG tablet Take 25 mg by mouth daily.   telmisartan (MICARDIS) 80 MG tablet Take 80 mg by mouth daily.   No facility-administered medications prior to visit.   "

## 2024-05-04 NOTE — Patient Instructions (Signed)
 Todd Potter  Please review the attached list of medications and notify my office if there are any errors.   . Please bring all of your medications to every appointment so we can make sure that our medication list is the same as yours.

## 2024-05-05 LAB — MICROALBUMIN / CREATININE URINE RATIO
Creatinine, Urine: 40.3 mg/dL
Microalb/Creat Ratio: 18 mg/g{creat} (ref 0–29)
Microalbumin, Urine: 7.4 ug/mL

## 2024-05-05 LAB — PSA TOTAL (REFLEX TO FREE): Prostate Specific Ag, Serum: 1 ng/mL (ref 0.0–4.0)

## 2024-05-05 LAB — COMPREHENSIVE METABOLIC PANEL WITH GFR
ALT: 10 IU/L (ref 0–44)
AST: 11 IU/L (ref 0–40)
Albumin: 4 g/dL (ref 3.8–4.8)
Alkaline Phosphatase: 141 IU/L — ABNORMAL HIGH (ref 47–123)
BUN/Creatinine Ratio: 10 (ref 10–24)
BUN: 11 mg/dL (ref 8–27)
Bilirubin Total: 0.5 mg/dL (ref 0.0–1.2)
CO2: 19 mmol/L — ABNORMAL LOW (ref 20–29)
Calcium: 9.5 mg/dL (ref 8.6–10.2)
Chloride: 105 mmol/L (ref 96–106)
Creatinine, Ser: 1.11 mg/dL (ref 0.76–1.27)
Globulin, Total: 3.7 g/dL (ref 1.5–4.5)
Glucose: 188 mg/dL — ABNORMAL HIGH (ref 70–99)
Potassium: 4 mmol/L (ref 3.5–5.2)
Sodium: 139 mmol/L (ref 134–144)
Total Protein: 7.7 g/dL (ref 6.0–8.5)
eGFR: 70 mL/min/1.73

## 2024-05-05 LAB — LIPID PANEL WITH LDL/HDL RATIO
Cholesterol, Total: 104 mg/dL (ref 100–199)
HDL: 33 mg/dL — ABNORMAL LOW
LDL Chol Calc (NIH): 56 mg/dL (ref 0–99)
LDL/HDL Ratio: 1.7 ratio (ref 0.0–3.6)
Triglycerides: 70 mg/dL (ref 0–149)
VLDL Cholesterol Cal: 15 mg/dL (ref 5–40)

## 2024-05-05 LAB — HEMOGLOBIN A1C
Est. average glucose Bld gHb Est-mCnc: 140 mg/dL
Hgb A1c MFr Bld: 6.5 % — ABNORMAL HIGH (ref 4.8–5.6)

## 2024-05-05 LAB — CBC
Hematocrit: 47.9 % (ref 37.5–51.0)
Hemoglobin: 15.1 g/dL (ref 13.0–17.7)
MCH: 28.5 pg (ref 26.6–33.0)
MCHC: 31.5 g/dL (ref 31.5–35.7)
MCV: 91 fL (ref 79–97)
Platelets: 208 x10E3/uL (ref 150–450)
RBC: 5.29 x10E6/uL (ref 4.14–5.80)
RDW: 14.3 % (ref 11.6–15.4)
WBC: 11.9 x10E3/uL — ABNORMAL HIGH (ref 3.4–10.8)

## 2024-05-05 LAB — VITAMIN D 25 HYDROXY (VIT D DEFICIENCY, FRACTURES): Vit D, 25-Hydroxy: 45.1 ng/mL (ref 30.0–100.0)

## 2024-05-06 ENCOUNTER — Ambulatory Visit: Payer: Self-pay | Admitting: Family Medicine
